# Patient Record
Sex: Female | Born: 1985 | Race: White | Hispanic: No | Marital: Married | Smoking: Never smoker
Health system: Southern US, Community
[De-identification: ages and names within clinical notes are randomized; demographics above are authoritative.]

## PROBLEM LIST (undated history)

## (undated) ENCOUNTER — Inpatient Hospital Stay (HOSPITAL_COMMUNITY): Payer: Self-pay

## (undated) DIAGNOSIS — F329 Major depressive disorder, single episode, unspecified: Secondary | ICD-10-CM

## (undated) DIAGNOSIS — G43909 Migraine, unspecified, not intractable, without status migrainosus: Secondary | ICD-10-CM

## (undated) DIAGNOSIS — Z8619 Personal history of other infectious and parasitic diseases: Secondary | ICD-10-CM

## (undated) DIAGNOSIS — F32A Depression, unspecified: Secondary | ICD-10-CM

## (undated) DIAGNOSIS — B999 Unspecified infectious disease: Secondary | ICD-10-CM

## (undated) DIAGNOSIS — K589 Irritable bowel syndrome without diarrhea: Secondary | ICD-10-CM

## (undated) DIAGNOSIS — Z9889 Other specified postprocedural states: Secondary | ICD-10-CM

## (undated) DIAGNOSIS — F419 Anxiety disorder, unspecified: Secondary | ICD-10-CM

## (undated) DIAGNOSIS — D649 Anemia, unspecified: Secondary | ICD-10-CM

## (undated) DIAGNOSIS — R112 Nausea with vomiting, unspecified: Secondary | ICD-10-CM

## (undated) HISTORY — PX: LAPAROSCOPY: SHX197

## (undated) HISTORY — DX: Personal history of other infectious and parasitic diseases: Z86.19

## (undated) HISTORY — DX: Irritable bowel syndrome, unspecified: K58.9

## (undated) HISTORY — DX: Depression, unspecified: F32.A

## (undated) HISTORY — PX: OTHER SURGICAL HISTORY: SHX169

## (undated) HISTORY — DX: Migraine, unspecified, not intractable, without status migrainosus: G43.909

## (undated) HISTORY — DX: Anxiety disorder, unspecified: F41.9

## (undated) HISTORY — DX: Major depressive disorder, single episode, unspecified: F32.9

---
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
## 2004-05-12 ENCOUNTER — Other Ambulatory Visit: Admission: RE | Admit: 2004-05-12 | Discharge: 2004-05-12 | Payer: Self-pay | Admitting: *Deleted

## 2005-08-01 ENCOUNTER — Other Ambulatory Visit: Admission: RE | Admit: 2005-08-01 | Discharge: 2005-08-01 | Payer: Self-pay | Admitting: *Deleted

## 2008-08-20 ENCOUNTER — Other Ambulatory Visit: Admission: RE | Admit: 2008-08-20 | Discharge: 2008-08-20 | Payer: Self-pay | Admitting: Gynecology

## 2009-07-17 ENCOUNTER — Encounter: Admission: RE | Admit: 2009-07-17 | Discharge: 2009-07-17 | Payer: Self-pay | Admitting: Family Medicine

## 2011-01-31 ENCOUNTER — Encounter (HOSPITAL_COMMUNITY)
Admission: RE | Admit: 2011-01-31 | Discharge: 2011-01-31 | Disposition: A | Discharge status: Home / Self Care | Payer: 59 | Source: Ambulatory Visit | Attending: Obstetrics and Gynecology | Admitting: Obstetrics and Gynecology

## 2011-01-31 LAB — CBC
HCT: 41.4 % (ref 36.0–46.0)
Hemoglobin: 14 g/dL (ref 12.0–15.0)
MCH: 31.5 pg (ref 26.0–34.0)
MCHC: 33.8 g/dL (ref 30.0–36.0)
MCV: 93.2 fL (ref 78.0–100.0)
Platelets: 249 10*3/uL (ref 150–400)
RBC: 4.44 MIL/uL (ref 3.87–5.11)
RDW: 12.1 % (ref 11.5–15.5)
WBC: 6.3 10*3/uL (ref 4.0–10.5)

## 2011-01-31 LAB — SURGICAL PCR SCREEN
MRSA, PCR: NEGATIVE
Staphylococcus aureus: POSITIVE — AB

## 2011-02-07 ENCOUNTER — Other Ambulatory Visit (HOSPITAL_COMMUNITY): Payer: Self-pay | Admitting: Obstetrics and Gynecology

## 2011-02-07 ENCOUNTER — Ambulatory Visit (HOSPITAL_COMMUNITY)
Admission: RE | Admit: 2011-02-07 | Discharge: 2011-02-07 | Disposition: A | Discharge status: Home / Self Care | Payer: 59 | Source: Ambulatory Visit | Attending: Obstetrics and Gynecology | Admitting: Obstetrics and Gynecology

## 2011-02-07 DIAGNOSIS — N949 Unspecified condition associated with female genital organs and menstrual cycle: Secondary | ICD-10-CM | POA: Insufficient documentation

## 2011-02-07 DIAGNOSIS — N803 Endometriosis of pelvic peritoneum, unspecified: Secondary | ICD-10-CM | POA: Insufficient documentation

## 2011-02-07 DIAGNOSIS — Z01812 Encounter for preprocedural laboratory examination: Secondary | ICD-10-CM | POA: Insufficient documentation

## 2011-02-07 DIAGNOSIS — Q504 Embryonic cyst of fallopian tube: Secondary | ICD-10-CM | POA: Insufficient documentation

## 2011-02-07 DIAGNOSIS — N946 Dysmenorrhea, unspecified: Secondary | ICD-10-CM | POA: Insufficient documentation

## 2011-02-07 DIAGNOSIS — Z01818 Encounter for other preprocedural examination: Secondary | ICD-10-CM | POA: Insufficient documentation

## 2011-02-07 LAB — PREGNANCY, URINE: Preg Test, Ur: NEGATIVE

## 2011-02-07 LAB — TYPE AND SCREEN
ABO/RH(D): A POS
Antibody Screen: NEGATIVE

## 2011-02-07 LAB — ABO/RH: ABO/RH(D): A POS

## 2011-02-08 NOTE — Op Note (Signed)
NAME:  Stacey Compton, Stacey Compton                ACCOUNT NO.:  [id number]  MEDICAL RECORD NO.:  [id number]           PATIENT TYPE:  O  LOCATION:  [facility]                          [facility]  PHYSICIAN:  Zelphia Cairo, MD    DATE OF BIRTH:  24-Mar-1986  DATE OF PROCEDURE:  02/07/2011 DATE OF DISCHARGE:                              OPERATIVE REPORT   PREOPERATIVE DIAGNOSES: 1. Dysmenorrhea. 2. Pelvic pain.  POSTOPERATIVE DIAGNOSES: 1. Pelvic pain. 2. Dysmenorrhea. 3. Adhesions. 4. Endometriosis.  PROCEDURE: 1. Diagnostic laparoscopy. 2. Fulguration of endometriosis. 3. Removal of hydatid cyst from left fallopian tube. 4. Lysis of adhesions. 5. Chromopertubation.  SURGEON:  Zelphia Cairo, MD.  ANESTHESIA:  General.  SPECIMEN:  Hydatid cyst to pathology.  BLOOD LOSS:  Minimal.  COMPLICATIONS:  None.  CONDITION:  Stable to recovery room.  DESCRIPTION OF PROCEDURE:  Stacey Compton was taken to the operating room after informed consent was obtained.  General anesthesia was obtained and she was placed in the dorsal lithotomy position using Allen stirrups.  She was prepped and draped in sterile fashion and an in-and-out catheter was used to drain her bladder for approximately 100 mL of clear urine. Bivalve speculum was placed in the vagina and a single-tooth tenaculum was placed on the anterior lip of the cervix.  Acorn tenaculum was placed through the cervix and attached to the single-tooth tenaculum. Speculum was removed and our attention was turned to the abdomen.  Local anesthesia was injected at the site of our infraumbilical incision and a small infraumbilical skin incision was made with a scalpel.  This was extended to the level of fascia bluntly using a Kelly clamp. Optical trocar was then inserted under direct visualization.  Once intraperitoneal placement was confirmed, CO2 was turned on and the abdomen and pelvis were insufflated.  A survey revealed a normal- appearing right  upper quadrant, normal-appearing appendix, normal- appearing uterus.  Right fallopian tube appeared normal.  There were a few small red-appearing endometriosis implants on the right ovary and a cluster of red endometriosis implants in the right ovarian fossa. The left fallopian tube and ovary were adhered to the left ovarian fossa.  The left sigmoid colon was adhered to the left ovarian fossa with thin filmy adhesions.  A 5-mm suprapubic incision was then made and a 5-mm trocar was inserted under direct visualization.  Blunt probe was placed through this port.    The left fallopian tube was lifted gently using million dollar graspers and a small hydatid cyst was noted from the left fallopian tube.  The base of the cyst was cauterized using the Kleppinger and the cyst was cut with laparoscopic scissors and removed through the port, passed off to be sent to pathology.  The blunt probe was then inserted through the suprapubic port, and the left sigmoid colon and rectum were bluntly dissected free of the left pelvic sidewall and ovarian fossa.  The left fallopian tube was then grasped, tented upwards and toward midline and thin filmy adhesions between the left fallopian tube and colon were then dissected free.  Implants of endometriosis were then ablated in the right  ovarian fossa, on the right ovary, on the left ovary.  There was a small cluster of endometriosis implants lying on the peritoneum of the left pelvic sidewall overlying the ureter.  These were not cauterized.  Chromopertubation was then performed using a dilute solution of methylene blue, blue dye immediately flowed from bilateral fallopian tubes.  A large syringe was then used to remove fluid and blue dye from the pelvis.  All instruments were then removed from the abdomen.  Trocars were removed and the CO2 was released.  A deep stitch was placed in the infraumbilical skin incision.  The skin was reapproximated using a  figure-of-eight stitch of 3-0 Vicryl.  Dermabond was placed over both incisions.  The acorn tenaculum and single-tooth tenaculum were removed from the cervix.  The patient was extubated and taken to the recovery room in stable condition.     Zelphia Cairo, MD     GA/MEDQ  D:  02/07/2011  T:  02/07/2011  Job:  [id number]  Electronically Signed by Zelphia Cairo MD on 02/08/2011 09:47:15 PM

## 2012-02-16 ENCOUNTER — Emergency Department (HOSPITAL_COMMUNITY)
Admission: EM | Admit: 2012-02-16 | Discharge: 2012-02-16 | Disposition: A | Discharge status: Home / Self Care | Payer: PRIVATE HEALTH INSURANCE | Attending: Emergency Medicine | Admitting: Emergency Medicine

## 2012-02-16 ENCOUNTER — Encounter (HOSPITAL_COMMUNITY): Payer: Self-pay | Admitting: *Deleted

## 2012-02-16 DIAGNOSIS — B349 Viral infection, unspecified: Secondary | ICD-10-CM

## 2012-02-16 DIAGNOSIS — R197 Diarrhea, unspecified: Secondary | ICD-10-CM | POA: Insufficient documentation

## 2012-02-16 DIAGNOSIS — R1110 Vomiting, unspecified: Secondary | ICD-10-CM | POA: Insufficient documentation

## 2012-02-16 DIAGNOSIS — Z8742 Personal history of other diseases of the female genital tract: Secondary | ICD-10-CM | POA: Insufficient documentation

## 2012-02-16 DIAGNOSIS — A088 Other specified intestinal infections: Secondary | ICD-10-CM | POA: Insufficient documentation

## 2012-02-16 DIAGNOSIS — K529 Noninfective gastroenteritis and colitis, unspecified: Secondary | ICD-10-CM

## 2012-02-16 LAB — URINALYSIS, ROUTINE W REFLEX MICROSCOPIC
Bilirubin Urine: NEGATIVE
Glucose, UA: NEGATIVE mg/dL
Ketones, ur: NEGATIVE mg/dL
Leukocytes, UA: NEGATIVE
Nitrite: NEGATIVE
Protein, ur: NEGATIVE mg/dL
Specific Gravity, Urine: 1.015 (ref 1.005–1.030)
Urobilinogen, UA: 0.2 mg/dL (ref 0.0–1.0)
pH: 6.5 (ref 5.0–8.0)

## 2012-02-16 LAB — URINE MICROSCOPIC-ADD ON

## 2012-02-16 LAB — PREGNANCY, URINE: Preg Test, Ur: NEGATIVE

## 2012-02-16 MED ORDER — MORPHINE SULFATE 2 MG/ML IJ SOLN
2.0000 mg | Freq: Once | INTRAMUSCULAR | Status: AC
  Administered 2012-02-16: 2 mg via INTRAVENOUS
  Filled 2012-02-16: qty 1

## 2012-02-16 MED ORDER — FAMOTIDINE IN NACL 20-0.9 MG/50ML-% IV SOLN
20.0000 mg | Freq: Once | INTRAVENOUS | Status: AC
  Administered 2012-02-16: 20 mg via INTRAVENOUS
  Filled 2012-02-16: qty 50

## 2012-02-16 MED ORDER — ONDANSETRON HCL 4 MG/2ML IJ SOLN
4.0000 mg | Freq: Once | INTRAMUSCULAR | Status: AC
  Administered 2012-02-16: 4 mg via INTRAVENOUS
  Filled 2012-02-16: qty 2

## 2012-02-16 MED ORDER — SODIUM CHLORIDE 0.9 % IV SOLN
Freq: Once | INTRAVENOUS | Status: AC
  Administered 2012-02-16: 500 mL via INTRAVENOUS

## 2012-02-16 MED ORDER — HYDROCODONE-ACETAMINOPHEN 5-325 MG PO TABS: 1.0000 | ORAL_TABLET | ORAL | Status: AC | PRN

## 2012-02-16 MED ORDER — SODIUM CHLORIDE 0.9 % IV BOLUS (SEPSIS)
500.0000 mL | Freq: Once | INTRAVENOUS | Status: AC
  Administered 2012-02-16: 500 mL via INTRAVENOUS

## 2012-02-16 MED ORDER — ONDANSETRON HCL 8 MG PO TABS: 8.0000 mg | ORAL_TABLET | Freq: Three times a day (TID) | ORAL | Status: AC | PRN

## 2012-02-16 MED ORDER — FAMOTIDINE 20 MG PO TABS: 20.0000 mg | ORAL_TABLET | Freq: Two times a day (BID) | ORAL | Status: DC | PRN

## 2012-02-16 MED ORDER — SODIUM CHLORIDE 0.9 % IV BOLUS (SEPSIS)
1000.0000 mL | Freq: Once | INTRAVENOUS | Status: AC
  Administered 2012-02-16: 1000 mL via INTRAVENOUS

## 2012-02-16 NOTE — ED Notes (Signed)
Pt reports n/v/d that started around 2200 yesterday,

## 2012-02-16 NOTE — Discharge Instructions (Signed)
Clear Liquid Diet The clear liquid dietconsists of foods that are liquid or will become liquid at room temperature.You should be able to see through the liquid and beverages. Examples of foods allowed on a clear liquid diet include fruit juice, broth or bouillon, gelatin, or frozen ice pops. The purpose of this diet is to provide necessary fluid, electrolytes such as sodium and potassium, and energy to keep the body functioning during times when you are not able to consume a regular diet.A clear liquid diet should not be continued for long periods of time as it is not nutritionally adequate.  REASONS FOR USING A CLEAR LIQUID DIET  In sudden onset (acute) conditions for a patient before or after surgery.   As the first step in oral feeding.   For fluid and electrolyte replacement in diarrheal diseases.   As a diet before certain medical tests are performed.  ADEQUACY The clear liquid diet is adequate only in ascorbic acid, according to the Recommended Dietary Allowances of the [employer]. CHOOSING FOODS Breads and Starches  Allowed:  None are allowed.   Avoid: All are avoided.  Vegetables  Allowed:  Strained tomato or vegetable juice.   Avoid: Any others.  Fruit  Allowed:  Strained fruit juices and fruit drinks. Include 1 serving of citrus or vitamin C-enriched fruit juice daily.   Avoid: Any others.  Meat and Meat Substitutes  Allowed:  None are allowed.   Avoid: All are avoided.  Milk  Allowed:  None are allowed.   Avoid: All are avoided.  Soups and Combination Foods  Allowed:  Clear bouillon, broth, or strained broth-based soups.   Avoid: Any others.  Desserts and Sweets  Allowed:  Sugar, honey. High protein gelatin. Flavored gelatin, ices, or frozen ice pops that do not contain milk.   Avoid: Any others.  Fats and Oils  Allowed:  None are allowed.   Avoid: All are avoided.  Beverages  Allowed: Cereal beverages, coffee (regular or  decaffeinated), tea, or soda at the discretion of your caregiver.   Avoid: Any others.  Condiments  Allowed:  Iodized salt.   Avoid: Any others, including pepper.  Supplements  Allowed:  Liquid nutrition beverages.   Avoid: Any others that contain lactose or fiber.  SAMPLE MEAL PLAN Breakfast  4 oz (120 mL) strained orange juice.    to 1 cup (125 to 250 mL) gelatin (plain or fortified).   1 cup (250 mL) beverage (coffee or tea).   Sugar, if desired.  Midmorning Snack   cup (125 mL) gelatin (plain or fortified).  Lunch  1 cup (250 mL) broth or consomm.   4 oz (120 mL) strained grapefruit juice.    cup (125 mL) gelatin (plain or fortified).   1 cup (250 mL) beverage (coffee or tea).   Sugar, if desired.  Midafternoon Snack   cup (125 mL) fruit ice.    cup (125 mL) strained fruit juice.  Dinner  1 cup (250 mL) broth or consomm.    cup (125 mL) cranberry juice.    cup (125 mL) flavored gelatin (plain or fortified).   1 cup (250 mL) beverage (coffee or tea).   Sugar, if desired.  Evening Snack  4 oz (120 mL) strained apple juice (vitamin C-fortified).    cup (125 mL) flavored gelatin (plain or fortified).  Document Released: 10/24/2005 Document Revised: 10/13/2011 Document Reviewed: 01/21/2011 [facility] Patient Information 2012 [place], [place].Diet for Diarrhea, Adult Having frequent, runny stools (diarrhea) has many causes.  Diarrhea may be caused or worsened by food or drink. Diarrhea may be relieved by changing your diet. IF YOU ARE NOT TOLERATING SOLID FOODS:  Drink enough water and fluids to keep your urine clear or pale yellow.   Avoid sugary drinks and sodas as well as milk-based beverages.   Avoid beverages containing caffeine and alcohol.   You may try rehydrating beverages. You can make your own by following this recipe:    tsp table salt.    tsp baking soda.   ? tsp salt substitute (potassium chloride).   1 tbs + 1 tsp  sugar.   1 qt water.  As your stools become more solid, you can start eating solid foods. Add foods one at a time. If a certain food causes your diarrhea to get worse, avoid that food and try other foods. A low fiber, low-fat, and lactose-free diet is recommended. Small, frequent meals may be better tolerated.  Starches  Allowed:  White, [place], and pita breads, plain rolls, buns, bagels. Plain muffins, matzo. Soda, saltine, or graham crackers. Pretzels, melba toast, zwieback. Cooked cereals made with water: cornmeal, farina, cream cereals. Dry cereals: refined corn, wheat, rice. Potatoes prepared any way without skins, refined macaroni, spaghetti, noodles, refined rice.   Avoid:  Bread, rolls, or crackers made with whole wheat, multi-grains, rye, bran seeds, nuts, or coconut. Corn tortillas or taco shells. Cereals containing whole grains, multi-grains, bran, coconut, nuts, or raisins. Cooked or dry oatmeal. Coarse wheat cereals, granola. Cereals advertised as "high-fiber." Potato skins. Whole grain pasta, wild or brown rice. Popcorn. Sweet potatoes/yams. Sweet rolls, doughnuts, waffles, pancakes, sweet breads.  Vegetables  Allowed: Strained tomato and vegetable juices. Most well-cooked and canned vegetables without seeds. Fresh: Tender lettuce, cucumber without the skin, cabbage, spinach, bean sprouts.   Avoid: Fresh, cooked, or canned: Artichokes, baked beans, beet greens, broccoli, Brussels sprouts, corn, kale, legumes, peas, sweet potatoes. Cooked: Green or red cabbage, spinach. Avoid large servings of any vegetables, because vegetables shrink when cooked, and they contain more fiber per serving than fresh vegetables.  Fruit  Allowed: All fruit juices except prune juice. Cooked or canned: Apricots, applesauce, cantaloupe, cherries, fruit cocktail, grapefruit, grapes, kiwi, mandarin oranges, peaches, pears, plums, watermelon. Fresh: Apples without skin, ripe banana, grapes, cantaloupe, cherries,  grapefruit, peaches, oranges, plums. Keep servings limited to  cup or 1 piece.   Avoid: Fresh: Apple with skin, apricots, mango, pears, raspberries, strawberries. Prune juice, stewed or dried prunes. Dried fruits, raisins, dates. Large servings of all fresh fruits.  Meat and Meat Substitutes  Allowed: Ground or well-cooked tender beef, ham, veal, lamb, pork, or poultry. Eggs, plain cheese. Fish, oysters, shrimp, lobster, other seafoods. Liver, organ meats.   Avoid: Tough, fibrous meats with gristle. Peanut butter, smooth or chunky. Cheese, nuts, seeds, legumes, dried peas, beans, lentils.  Milk  Allowed: Yogurt, lactose-free milk, kefir, drinkable yogurt, buttermilk, soy milk.   Avoid: Milk, chocolate milk, beverages made with milk, such as milk shakes.  Soups  Allowed: Bouillon, broth, or soups made from allowed foods. Any strained soup.   Avoid: Soups made from vegetables that are not allowed, cream or milk-based soups.  Desserts and Sweets  Allowed: Sugar-free gelatin, sugar-free frozen ice pops made without sugar alcohol.   Avoid: Plain cakes and cookies, pie made with allowed fruit, pudding, custard, cream pie. Gelatin, fruit, ice, sherbet, frozen ice pops. Ice cream, ice milk without nuts. Plain hard candy, honey, jelly, molasses, syrup, sugar, chocolate syrup, gumdrops, marshmallows.  Fats  and Oils  Allowed: Avoid any fats and oils.   Avoid: Seeds, nuts, olives, avocados. Margarine, butter, cream, mayonnaise, salad oils, plain salad dressings made from allowed foods. Plain gravy, crisp bacon without rind.  Beverages  Allowed: Water, decaffeinated teas, oral rehydration solutions, sugar-free beverages.   Avoid: Fruit juices, caffeinated beverages (coffee, tea, soda or pop), alcohol, sports drinks, or lemon-lime soda or pop.  Condiments  Allowed: Ketchup, mustard, horseradish, vinegar, cream sauce, cheese sauce, cocoa powder. Spices in moderation: allspice, basil, bay leaves,  celery powder or leaves, cinnamon, cumin powder, curry powder, ginger, mace, marjoram, onion or garlic powder, oregano, paprika, parsley flakes, ground pepper, rosemary, sage, savory, tarragon, thyme, turmeric.   Avoid: Coconut, honey.  Weight Monitoring: Weigh yourself every day. You should weigh yourself in the morning after you urinate and before you eat breakfast. Wear the same amount of clothing when you weigh yourself. Record your weight daily. Bring your recorded weights to your clinic visits. Tell your caregiver right away if you have gained 3 lb/1.4 kg or more in 1 day, 5 lb/2.3 kg in a week, or whatever amount you were told to report. SEEK IMMEDIATE MEDICAL CARE IF:   You are unable to keep fluids down.   You start to throw up (vomit) or diarrhea keeps coming back (persistent).   Abdominal pain develops, increases, or can be felt in one place (localizes).   You have an oral temperature above 102 F (38.9 C), not controlled by medicine.   Diarrhea contains blood or mucus.   You develop excessive weakness, dizziness, fainting, or extreme thirst.  MAKE SURE YOU:   Understand these instructions.   Will watch your condition.   Will get help right away if you are not doing well or get worse.  Document Released: 01/14/2004 Document Revised: 10/13/2011 Document Reviewed: 05/07/2009 [facility] Patient Information 2012 [place], [place].Viral Gastroenteritis Viral gastroenteritis is also known as stomach flu. This condition affects the stomach and intestinal tract. It can cause sudden diarrhea and vomiting. The illness typically lasts 3 to 8 days. Most people develop an immune response that eventually gets rid of the virus. While this natural response develops, the virus can make you quite ill. CAUSES  Many different viruses can cause gastroenteritis, such as rotavirus or noroviruses. You can catch one of these viruses by consuming contaminated food or water. You may also catch a virus by  sharing utensils or other personal items with an infected person or by touching a contaminated surface. SYMPTOMS  The most common symptoms are diarrhea and vomiting. These problems can cause a severe loss of body fluids (dehydration) and a body salt (electrolyte) imbalance. Other symptoms may include:  Fever.   Headache.   Fatigue.   Abdominal pain.  DIAGNOSIS  Your caregiver can usually diagnose viral gastroenteritis based on your symptoms and a physical exam. A stool sample may also be taken to test for the presence of viruses or other infections. TREATMENT  This illness typically goes away on its own. Treatments are aimed at rehydration. The most serious cases of viral gastroenteritis involve vomiting so severely that you are not able to keep fluids down. In these cases, fluids must be given through an intravenous line (IV). HOME CARE INSTRUCTIONS   Drink enough fluids to keep your urine clear or pale yellow. Drink small amounts of fluids frequently and increase the amounts as tolerated.   Ask your caregiver for specific rehydration instructions.   Avoid:   Foods high in sugar.  Alcohol.   Carbonated drinks.   Tobacco.   Juice.   Caffeine drinks.   Extremely hot or cold fluids.   Fatty, greasy foods.   Too much intake of anything at one time.   Dairy products until 24 to 48 hours after diarrhea stops.   You may consume probiotics. Probiotics are active cultures of beneficial bacteria. They may lessen the amount and number of diarrheal stools in adults. Probiotics can be found in yogurt with active cultures and in supplements.   Wash your hands well to avoid spreading the virus.   Only take over-the-counter or prescription medicines for pain, discomfort, or fever as directed by your caregiver. Do not give aspirin to children. Antidiarrheal medicines are not recommended.   Ask your caregiver if you should continue to take your regular prescribed and over-the-counter  medicines.   Keep all follow-up appointments as directed by your caregiver.  SEEK IMMEDIATE MEDICAL CARE IF:   You are unable to keep fluids down.   You do not urinate at least once every 6 to 8 hours.   You develop shortness of breath.   You notice blood in your stool or vomit. This may look like coffee grounds.   You have abdominal pain that increases or is concentrated in one small area (localized).   You have persistent vomiting or diarrhea.   You have a fever.   The patient is a child younger than 3 months, and he or she has a fever.   The patient is a child older than 3 months, and he or she has a fever and persistent symptoms.   The patient is a child older than 3 months, and he or she has a fever and symptoms suddenly get worse.   The patient is a baby, and he or she has no tears when crying.  MAKE SURE YOU:   Understand these instructions.   Will watch your condition.   Will get help right away if you are not doing well or get worse.  Document Released: 10/24/2005 Document Revised: 10/13/2011 Document Reviewed: 08/10/2011 [facility] Patient Information 2012 [place], [place].

## 2012-02-16 NOTE — ED Notes (Signed)
Pt reports started having n/v/d around 2200 yesterday.

## 2012-02-16 NOTE — ED Provider Notes (Signed)
History  This chart was scribed for Felisa Bonier, MD by Bennett Scrape and Cherlynn Perches. This patient was seen in room APA06/APA06 and the patient's care was started at 7:59AM.  CSN: [id number]  Arrival date & time 02/16/12  0532   First MD Initiated Contact with Patient 02/16/12 0719      Chief Complaint  Patient presents with  . Emesis  . Diarrhea    (Consider location/radiation/quality/duration/timing/severity/associated sxs/prior treatment) Patient is a 26 y.o. female presenting with vomiting. The history is provided by the patient. No language interpreter was used.  Emesis  This is a new problem. The current episode started 6 to 12 hours ago. The problem occurs continuously. The problem has not changed since onset.The emesis has an appearance of stomach contents. There has been no fever. Associated symptoms include abdominal pain, chills, cough, diarrhea and sweats. Pertinent negatives include no fever.    Stacey Compton is a 26 y.o. female who presents to the Emergency Department complaining of 10 hours of constant emesis with associated diarrhea, nausea, chills, diaphoresis, abdominal pain, and cough. No modifying factors were reported. Pt states that in the 10 hours of emesis, vomiting has been constant, with a maximum of 30 minutes between episodes. Pt reports that diaphoresis and chills are only present when vomiting. Pt reports having no known ill contacts. Pt denies fever, chest pain, and SOB.   Past Medical History  Diagnosis Date  . Endometriosis     Past Surgical History  Procedure Date  . Laparoscopy     History reviewed. No pertinent family history.  History  Substance Use Topics  . Smoking status: Never Smoker   . Smokeless tobacco: Not on file  . Alcohol Use: No    OB History    Grav Para Term Preterm Abortions TAB SAB Ect Mult Living                  Review of Systems  Constitutional: Positive for chills, diaphoresis and appetite change.  Negative for fever.  Respiratory: Positive for cough. Negative for shortness of breath and wheezing.   Cardiovascular: Negative for chest pain.  Gastrointestinal: Positive for vomiting, abdominal pain and diarrhea.  Psychiatric/Behavioral: Negative for confusion.  All other systems reviewed and are negative.    Allergies  Review of patient's allergies indicates no known allergies.  Home Medications   Current Outpatient Rx  Name Route Sig Dispense Refill  . NORETHINDRONE ACETATE 5 MG PO TABS Oral Take 5 mg by mouth daily.      Triage Vitals: BP 98/85  Pulse 110  Temp(Src) 98.3 F (36.8 C) (Oral)  Resp 20  Ht 5\' 3"  (1.6 m)  Wt 125 lb (56.7 kg)  BMI 22.14 kg/m2  SpO2 96%  Physical Exam  Nursing note and vitals reviewed. Constitutional: She is oriented to person, place, and time. She appears well-developed and well-nourished.  HENT:  Head: Normocephalic and atraumatic.  Right Ear: External ear normal.  Left Ear: External ear normal.  Nose: Nose normal.  Mouth/Throat: Oropharynx is clear and moist. No oropharyngeal exudate.       Slightly dry mucous membrane, TMs and auditory canals normal bilaterally. No erythema or edema noted to the posterior oropharynx.  Eyes: Conjunctivae are normal. Pupils are equal, round, and reactive to light. No scleral icterus.  Neck: Normal range of motion. Neck supple.  Cardiovascular: Normal rate, regular rhythm and normal heart sounds.        Radial pulse palpable  Pulmonary/Chest: Effort  normal and breath sounds normal. No respiratory distress. She has no wheezes. She has no rales.       Lungs clear to auscultation bilaterally, no rhonchi noted.  Abdominal: Soft. Bowel sounds are normal. She exhibits no distension and no mass. There is tenderness (mild left tenderness). There is no rebound and no guarding.  Musculoskeletal: Normal range of motion. She exhibits no edema (no edema to lower extremities).  Neurological: She is alert and oriented  to person, place, and time.  Skin: Skin is warm and dry.       capillary refill <1s  Psychiatric: She has a normal mood and affect. Her behavior is normal.    ED Course  Procedures (including critical care time)  DIAGNOSTIC STUDIES: Oxygen Saturation is 96% on room air, adequate by my interpretation.    COORDINATION OF CARE: 8:04AM - Pt informed of treatment plan. Will try to control nausea, vomiting with medication and run fluid trial. Pt informed that diarrhea will continue for 1-2 days. Pt advised to rehydrate. Pt agrees to treatment plan    Labs Reviewed  URINALYSIS, ROUTINE W REFLEX MICROSCOPIC - Abnormal; Notable for the following:    Hgb urine dipstick TRACE (*)    All other components within normal limits  PREGNANCY, URINE  URINE MICROSCOPIC-ADD ON   No results found.   No diagnosis found.    MDM  The patient now feeling significantly better, tolerating oral intake of fluids with no further emesis. She requests discharge home. I do believe that the patient has a viral gastroenteritis and I do not have suspicion based on her history or physical examination for acute pancreatitis, acute cholecystitis, cholelithiasis, hepatitis, bowel obstruction, or urinary tract infection. I will discharge patient home.  I personally performed the services described in this documentation, which was scribed in my presence. The recorded information has been reviewed and considered.    Felisa Bonier, MD 02/16/12 [phone]

## 2012-09-07 LAB — OB RESULTS CONSOLE HIV ANTIBODY (ROUTINE TESTING): HIV: NONREACTIVE

## 2012-09-07 LAB — OB RESULTS CONSOLE RUBELLA ANTIBODY, IGM: Rubella: IMMUNE

## 2012-09-07 LAB — OB RESULTS CONSOLE ABO/RH: RH Type: POSITIVE

## 2012-09-07 LAB — OB RESULTS CONSOLE GC/CHLAMYDIA
Chlamydia: NEGATIVE
Gonorrhea: NEGATIVE

## 2012-09-07 LAB — OB RESULTS CONSOLE HEPATITIS B SURFACE ANTIGEN: Hepatitis B Surface Ag: NEGATIVE

## 2012-09-07 LAB — OB RESULTS CONSOLE RPR: RPR: NONREACTIVE

## 2012-11-07 NOTE — L&D Delivery Note (Signed)
Operative Delivery Note At 7:44 PM a viable female was delivered via Vaginal, [employer], operational).  Presentation: vertex; Position: Occiput,, Anterior; Station: +3.  Verbal consent: obtained from patient.  Risks and benefits discussed in detail.  Risks include, but are not limited to the risks of anesthesia, bleeding, infection, damage to maternal tissues, fetal cephalhematoma.  There is also the risk of inability to effect vaginal delivery of the head, or shoulder dystocia that cannot be resolved by established maneuvers, leading to the need for emergency cesarean section. Initially used mytivac mushroom,  Would not hold suction.  Brought in kiwi applied and brought to proper sunction level,  Vertex brought to crowning.  Kiwi removed.  Subsequent vag delivery over 2 degree laceration.   APGAR: 8/9, ; weight .   Placenta status: Intact, Spontaneous.   Cord: 3 vessels with the following complications: None.  Cord pH: na  Anesthesia: Epidural  Instruments: mytivac mushroom and kiwi Episiotomy: None Lacerations: 2nd degree Suture Repair: 2.0 chromic Est. Blood Loss (mL): 350  Mom to postpartum.  Baby to nursery-stable.  Amenda Duclos S 04/08/2013, 7:58 PM

## 2013-01-25 ENCOUNTER — Encounter: Payer: Self-pay | Admitting: *Deleted

## 2013-01-29 ENCOUNTER — Encounter (HOSPITAL_COMMUNITY): Payer: Self-pay

## 2013-01-29 ENCOUNTER — Inpatient Hospital Stay (HOSPITAL_COMMUNITY)
Admission: AD | Admit: 2013-01-29 | Discharge: 2013-01-29 | Disposition: A | Discharge status: Home / Self Care | Payer: PRIVATE HEALTH INSURANCE | Source: Ambulatory Visit | Attending: Obstetrics and Gynecology | Admitting: Obstetrics and Gynecology

## 2013-01-29 DIAGNOSIS — N898 Other specified noninflammatory disorders of vagina: Secondary | ICD-10-CM

## 2013-01-29 DIAGNOSIS — N949 Unspecified condition associated with female genital organs and menstrual cycle: Secondary | ICD-10-CM | POA: Insufficient documentation

## 2013-01-29 DIAGNOSIS — R109 Unspecified abdominal pain: Secondary | ICD-10-CM | POA: Insufficient documentation

## 2013-01-29 DIAGNOSIS — O26893 Other specified pregnancy related conditions, third trimester: Secondary | ICD-10-CM

## 2013-01-29 DIAGNOSIS — O26899 Other specified pregnancy related conditions, unspecified trimester: Secondary | ICD-10-CM

## 2013-01-29 DIAGNOSIS — O99891 Other specified diseases and conditions complicating pregnancy: Secondary | ICD-10-CM | POA: Insufficient documentation

## 2013-01-29 LAB — URINALYSIS, ROUTINE W REFLEX MICROSCOPIC
Bilirubin Urine: NEGATIVE
Glucose, UA: NEGATIVE mg/dL
Hgb urine dipstick: NEGATIVE
Ketones, ur: NEGATIVE mg/dL
Leukocytes, UA: NEGATIVE
Nitrite: NEGATIVE
Protein, ur: NEGATIVE mg/dL
Specific Gravity, Urine: 1.015 (ref 1.005–1.030)
Urobilinogen, UA: 0.2 mg/dL (ref 0.0–1.0)
pH: 6 (ref 5.0–8.0)

## 2013-01-29 LAB — WET PREP, GENITAL
Clue Cells Wet Prep HPF POC: NONE SEEN
Trich, Wet Prep: NONE SEEN
Yeast Wet Prep HPF POC: NONE SEEN

## 2013-01-29 LAB — AMNISURE RUPTURE OF MEMBRANE (ROM) NOT AT ARMC: Amnisure ROM: NEGATIVE

## 2013-01-29 NOTE — MAU Note (Signed)
Patient is in with c/o possible of rupture of fluids last night with clear fluids. Constant pelvic pressure for a week but have increased in intensity. She reports good fetal movement. Denies vaginal bleeding

## 2013-01-29 NOTE — MAU Note (Signed)
Pt states having persistent trickling of fluid, unsure if it is amniotic or not. Advised to be evaluated in MAU. Notes increased lower abd pain/pressure into vaginal area. Denies bleeding.

## 2013-01-29 NOTE — MAU Provider Note (Signed)
Chief Complaint:  R/O Rupture    First Provider Initiated Contact with Patient 01/29/13 1802      HPI: Stacey Compton is a 27 y.o. G1P0 at 65w5dwho presents to maternity admissions reporting cramping and constant pelvic pressure x2-3 days, with leakage of clear fluid x3-4 episodes starting last night.  She describes the leakage as clear, enough to wet a panty liner or her underwear, but not requiring a pad or running down her leg when standing up.  She reports good fetal movement, denies vaginal bleeding, vaginal itching/burning, urinary symptoms, h/a, dizziness, n/v, or fever/chills.     Past Medical History: Past Medical History  Diagnosis Date  . Endometriosis   . Migraines     with aura    Past obstetric history: OB History   Grav Para Term Preterm Abortions TAB SAB Ect Mult Living   1              # Outc Date GA Lbr Len/2nd Wgt Sex Del Anes PTL Lv   1 CUR               Past Surgical History: Past Surgical History  Procedure Laterality Date  . Laparoscopy      Family History: History reviewed. No pertinent family history.  Social History: History  Substance Use Topics  . Smoking status: Never Smoker   . Smokeless tobacco: Not on file  . Alcohol Use: No    Allergies: No Known Allergies  Meds:  Prescriptions prior to admission  Medication Sig Dispense Refill  . Docosahexaenoic Acid (DHA PO) Take 1 capsule by mouth at bedtime.      . Prenatal Vit-Fe Fumarate-FA (PRENATAL MULTIVITAMIN) TABS Take 1 tablet by mouth at bedtime.      . ranitidine (ZANTAC) 75 MG tablet Take 75 mg by mouth 2 (two) times daily.        ROS: Pertinent findings in history of present illness.  Physical Exam  Blood pressure 99/69, pulse 96, temperature 98 F (36.7 C), temperature source Oral, resp. rate 16, height 5\' 3"  (1.6 m), weight 66.452 kg (146 lb 8 oz), SpO2 100.00%. GENERAL: Well-developed, well-nourished female in no acute distress.  HEENT: normocephalic HEART: normal  rate RESP: normal effort ABDOMEN: Soft, non-tender, gravid appropriate for gestational age EXTREMITIES: Nontender, no edema NEURO: alert and oriented Pelvic exam: Cervix pink, visually closed, without lesion, large amount white creamy discharge, clinging to vaginal walls, vaginal walls and external genitalia normal Negative pooling with cough/bearing down  Dilation: Closed Effacement (%): Thick Exam by:: Zaydee Aina leftwich-kirby cnm  FHT:  Baseline 135 , moderate variability, accelerations present (15x15), no decelerations Contractions: None on toco or to palpation   Labs: Results for orders placed during the hospital encounter of 01/29/13 (from the past 24 hour(s))  AMNISURE RUPTURE OF MEMBRANE (ROM)     Status: None   Collection Time    01/29/13  6:00 PM      Result Value Range   Amnisure ROM NEGATIVE    WET PREP, GENITAL     Status: Abnormal   Collection Time    01/29/13  6:00 PM      Result Value Range   Yeast Wet Prep HPF POC NONE SEEN  NONE SEEN   Trich, Wet Prep NONE SEEN  NONE SEEN   Clue Cells Wet Prep HPF POC NONE SEEN  NONE SEEN   WBC, Wet Prep HPF POC MODERATE (*) NONE SEEN      Assessment: 1. Vaginal discharge in  pregnancy, third trimester     Plan: Called Dr Rana Snare to review assessment and findings Discharge home with PTL precautions F/U with Dr Vincente Poli in office as scheduled Return to MAU as needed    Medication List    ASK your doctor about these medications       DHA PO  Take 1 capsule by mouth at bedtime.     prenatal multivitamin Tabs  Take 1 tablet by mouth at bedtime.     ranitidine 75 MG tablet  Commonly known as:  ZANTAC  Take 75 mg by mouth 2 (two) times daily.        Sharen Counter Certified Nurse-Midwife 01/29/2013 6:34 PM

## 2013-03-07 LAB — OB RESULTS CONSOLE GBS: GBS: POSITIVE

## 2013-03-08 ENCOUNTER — Ambulatory Visit (INDEPENDENT_AMBULATORY_CARE_PROVIDER_SITE_OTHER): Payer: PRIVATE HEALTH INSURANCE | Admitting: Family Medicine

## 2013-03-08 ENCOUNTER — Encounter: Payer: Self-pay | Admitting: Family Medicine

## 2013-03-08 VITALS — BP 112/80 | Temp 97.9°F | Ht 63.0 in | Wt 160.0 lb

## 2013-03-08 DIAGNOSIS — J029 Acute pharyngitis, unspecified: Secondary | ICD-10-CM

## 2013-03-08 DIAGNOSIS — J019 Acute sinusitis, unspecified: Secondary | ICD-10-CM

## 2013-03-08 MED ORDER — AMOXICILLIN 500 MG PO CAPS: 500.0000 mg | ORAL_CAPSULE | Freq: Three times a day (TID) | ORAL | Status: DC

## 2013-03-08 NOTE — Patient Instructions (Signed)
UC amoxicillin 3 times a day for the next 10 days for your sinus infection. Try to get plenty of rest. Keep yourself well hydrated. He may use saline nasal spray. Avoid any decongestants. In addition to this if you start running high fevers it is important to called her gynecologist. Certainly if any problems feel free to call us.

## 2013-03-08 NOTE — Progress Notes (Signed)
  Subjective:    Patient ID: Stacey Compton, female    DOB: July 13, 1986, 27 y.o.   MRN: [id number]  Sore Throat  This is a new problem. The current episode started in the past 7 days. The problem has been gradually worsening. Neither side of throat is experiencing more pain than the other. The maximum temperature recorded prior to her arrival was 100 - 100.9 F. The fever has been present for 1 to 2 days. The pain is at a severity of 8/10. The pain is moderate. Associated symptoms include congestion. Associated symptoms comments: Nausea, dizziness. Treatments tried: OTC sinus medication. The treatment provided no relief.   Patient is [redacted] weeks pregnant she denies any high fever she states it just more of a low-grade fever along with head congestion sinus pressure soreness discomfort underneath the eyes and sore throat. She denies any wheezing or difficulty breathing.    Review of Systems  HENT: Positive for congestion.        Objective:   Physical Exam Eardrums are normal throat ear red but no exudate notedno swelling noted. Lungs are clear hearts regular.       Assessment & Plan:  Pharyngitis-probable viral illness with acute sinusitis-amoxicillin 500 3 times a day 10 days recheck if progressive troubles patient was also encourage if high fevers call her OB/GYN. Followup here if any particular issues call if worse.

## 2013-04-04 ENCOUNTER — Telehealth (HOSPITAL_COMMUNITY): Payer: Self-pay | Admitting: *Deleted

## 2013-04-04 ENCOUNTER — Encounter (HOSPITAL_COMMUNITY): Payer: Self-pay | Admitting: *Deleted

## 2013-04-04 NOTE — Telephone Encounter (Signed)
Preadmission screen  

## 2013-04-07 ENCOUNTER — Encounter (HOSPITAL_COMMUNITY): Payer: Self-pay

## 2013-04-07 ENCOUNTER — Inpatient Hospital Stay (HOSPITAL_COMMUNITY)
Admission: RE | Admit: 2013-04-07 | Discharge: 2013-04-10 | DRG: 775 | Disposition: A | Discharge status: Home / Self Care | Payer: PRIVATE HEALTH INSURANCE | Source: Ambulatory Visit | Attending: Obstetrics and Gynecology | Admitting: Obstetrics and Gynecology

## 2013-04-07 DIAGNOSIS — O99892 Other specified diseases and conditions complicating childbirth: Secondary | ICD-10-CM | POA: Diagnosis present

## 2013-04-07 DIAGNOSIS — Z22330 Carrier of Group B streptococcus: Secondary | ICD-10-CM

## 2013-04-07 LAB — CBC
HCT: 39.8 % (ref 36.0–46.0)
Hemoglobin: 13.6 g/dL (ref 12.0–15.0)
MCH: 32.3 pg (ref 26.0–34.0)
MCHC: 34.2 g/dL (ref 30.0–36.0)
MCV: 94.5 fL (ref 78.0–100.0)
Platelets: 211 10*3/uL (ref 150–400)
RBC: 4.21 MIL/uL (ref 3.87–5.11)
RDW: 14 % (ref 11.5–15.5)
WBC: 12.6 10*3/uL — ABNORMAL HIGH (ref 4.0–10.5)

## 2013-04-07 MED ORDER — ZOLPIDEM TARTRATE 5 MG PO TABS: 5.0000 mg | ORAL_TABLET | Freq: Every evening | ORAL | Status: DC | PRN

## 2013-04-07 MED ORDER — CITRIC ACID-SODIUM CITRATE 334-500 MG/5ML PO SOLN: 30.0000 mL | ORAL | Status: DC | PRN

## 2013-04-07 MED ORDER — TERBUTALINE SULFATE 1 MG/ML IJ SOLN: 0.2500 mg | Freq: Once | INTRAMUSCULAR | Status: AC | PRN

## 2013-04-07 MED ORDER — OXYCODONE-ACETAMINOPHEN 5-325 MG PO TABS: 1.0000 | ORAL_TABLET | ORAL | Status: DC | PRN

## 2013-04-07 MED ORDER — LIDOCAINE HCL (PF) 1 % IJ SOLN
30.0000 mL | INTRAMUSCULAR | Status: DC | PRN
  Filled 2013-04-07 (×2): qty 30

## 2013-04-07 MED ORDER — ACETAMINOPHEN 325 MG PO TABS
650.0000 mg | ORAL_TABLET | ORAL | Status: DC | PRN
  Administered 2013-04-08: 650 mg via ORAL
  Filled 2013-04-07: qty 2

## 2013-04-07 MED ORDER — OXYTOCIN BOLUS FROM INFUSION
500.0000 mL | INTRAVENOUS | Status: DC
  Administered 2013-04-08: 500 mL via INTRAVENOUS

## 2013-04-07 MED ORDER — IBUPROFEN 600 MG PO TABS: 600.0000 mg | ORAL_TABLET | Freq: Four times a day (QID) | ORAL | Status: DC | PRN

## 2013-04-07 MED ORDER — LACTATED RINGERS IV SOLN: 500.0000 mL | INTRAVENOUS | Status: DC | PRN

## 2013-04-07 MED ORDER — FLEET ENEMA 7-19 GM/118ML RE ENEM: 1.0000 | ENEMA | RECTAL | Status: DC | PRN

## 2013-04-07 MED ORDER — OXYTOCIN 40 UNITS IN LACTATED RINGERS INFUSION - SIMPLE MED
62.5000 mL/h | INTRAVENOUS | Status: DC
  Filled 2013-04-07: qty 1000

## 2013-04-07 MED ORDER — LACTATED RINGERS IV SOLN
INTRAVENOUS | Status: DC
  Administered 2013-04-07 – 2013-04-08 (×4): via INTRAVENOUS

## 2013-04-07 MED ORDER — ONDANSETRON HCL 4 MG/2ML IJ SOLN
4.0000 mg | Freq: Four times a day (QID) | INTRAMUSCULAR | Status: DC | PRN
  Administered 2013-04-08: 4 mg via INTRAVENOUS
  Filled 2013-04-07: qty 2

## 2013-04-07 MED ORDER — MISOPROSTOL 25 MCG QUARTER TABLET
25.0000 ug | ORAL_TABLET | ORAL | Status: DC | PRN
  Administered 2013-04-07 – 2013-04-08 (×2): 25 ug via VAGINAL
  Filled 2013-04-07: qty 0.25
  Filled 2013-04-07: qty 1
  Filled 2013-04-07: qty 0.25

## 2013-04-07 NOTE — Progress Notes (Signed)
cytotec placed per orders

## 2013-04-08 ENCOUNTER — Encounter (HOSPITAL_COMMUNITY): Payer: Self-pay

## 2013-04-08 ENCOUNTER — Inpatient Hospital Stay (HOSPITAL_COMMUNITY): Payer: PRIVATE HEALTH INSURANCE | Admitting: Anesthesiology

## 2013-04-08 ENCOUNTER — Encounter (HOSPITAL_COMMUNITY): Payer: Self-pay | Admitting: Anesthesiology

## 2013-04-08 LAB — RPR: RPR Ser Ql: NONREACTIVE

## 2013-04-08 MED ORDER — IBUPROFEN 600 MG PO TABS
600.0000 mg | ORAL_TABLET | Freq: Four times a day (QID) | ORAL | Status: DC
  Administered 2013-04-08 – 2013-04-10 (×6): 600 mg via ORAL
  Filled 2013-04-08 (×6): qty 1

## 2013-04-08 MED ORDER — PENICILLIN G POTASSIUM 5000000 UNITS IJ SOLR
5.0000 10*6.[IU] | Freq: Once | INTRAVENOUS | Status: AC
  Administered 2013-04-08: 5 10*6.[IU] via INTRAVENOUS
  Filled 2013-04-08 (×2): qty 5

## 2013-04-08 MED ORDER — BISACODYL 10 MG RE SUPP: 10.0000 mg | Freq: Every day | RECTAL | Status: DC | PRN

## 2013-04-08 MED ORDER — LACTATED RINGERS IV SOLN
500.0000 mL | Freq: Once | INTRAVENOUS | Status: DC
Start: 2013-04-08 — End: 2013-04-08

## 2013-04-08 MED ORDER — ZOLPIDEM TARTRATE 5 MG PO TABS: 5.0000 mg | ORAL_TABLET | Freq: Every evening | ORAL | Status: DC | PRN

## 2013-04-08 MED ORDER — PHENYLEPHRINE 40 MCG/ML (10ML) SYRINGE FOR IV PUSH (FOR BLOOD PRESSURE SUPPORT)
80.0000 ug | PREFILLED_SYRINGE | INTRAVENOUS | Status: DC | PRN
  Filled 2013-04-08: qty 2

## 2013-04-08 MED ORDER — OXYTOCIN 40 UNITS IN LACTATED RINGERS INFUSION - SIMPLE MED
1.0000 m[IU]/min | INTRAVENOUS | Status: DC
  Administered 2013-04-08: 2 m[IU]/min via INTRAVENOUS

## 2013-04-08 MED ORDER — PENICILLIN G POTASSIUM 5000000 UNITS IJ SOLR
2.5000 10*6.[IU] | INTRAVENOUS | Status: DC
  Administered 2013-04-08 (×2): 2.5 10*6.[IU] via INTRAVENOUS
  Filled 2013-04-08 (×5): qty 2.5

## 2013-04-08 MED ORDER — SIMETHICONE 80 MG PO CHEW: 80.0000 mg | CHEWABLE_TABLET | ORAL | Status: DC | PRN

## 2013-04-08 MED ORDER — ONDANSETRON HCL 4 MG PO TABS: 4.0000 mg | ORAL_TABLET | ORAL | Status: DC | PRN

## 2013-04-08 MED ORDER — DIPHENHYDRAMINE HCL 25 MG PO CAPS: 25.0000 mg | ORAL_CAPSULE | Freq: Four times a day (QID) | ORAL | Status: DC | PRN

## 2013-04-08 MED ORDER — DIPHENHYDRAMINE HCL 50 MG/ML IJ SOLN: 12.5000 mg | INTRAMUSCULAR | Status: DC | PRN

## 2013-04-08 MED ORDER — PHENYLEPHRINE 40 MCG/ML (10ML) SYRINGE FOR IV PUSH (FOR BLOOD PRESSURE SUPPORT)
80.0000 ug | PREFILLED_SYRINGE | INTRAVENOUS | Status: DC | PRN
  Filled 2013-04-08: qty 5
  Filled 2013-04-08: qty 2

## 2013-04-08 MED ORDER — EPHEDRINE 5 MG/ML INJ
10.0000 mg | INTRAVENOUS | Status: DC | PRN
  Filled 2013-04-08: qty 2

## 2013-04-08 MED ORDER — PRENATAL MULTIVITAMIN CH
1.0000 | ORAL_TABLET | Freq: Every day | ORAL | Status: DC
  Administered 2013-04-09: 1 via ORAL
  Filled 2013-04-08: qty 1

## 2013-04-08 MED ORDER — BENZOCAINE-MENTHOL 20-0.5 % EX AERO
1.0000 "application " | INHALATION_SPRAY | CUTANEOUS | Status: DC | PRN
  Administered 2013-04-09: 1 via TOPICAL
  Filled 2013-04-08: qty 56

## 2013-04-08 MED ORDER — LIDOCAINE HCL (PF) 1 % IJ SOLN
INTRAMUSCULAR | Status: DC | PRN
  Administered 2013-04-08: 5 mL
  Administered 2013-04-08: 30 mL
  Administered 2013-04-08: 5 mL

## 2013-04-08 MED ORDER — EPHEDRINE 5 MG/ML INJ
10.0000 mg | INTRAVENOUS | Status: DC | PRN
  Filled 2013-04-08: qty 4
  Filled 2013-04-08: qty 2

## 2013-04-08 MED ORDER — FENTANYL 2.5 MCG/ML BUPIVACAINE 1/10 % EPIDURAL INFUSION (WH - ANES)
14.0000 mL/h | INTRAMUSCULAR | Status: DC | PRN
  Administered 2013-04-08: 14 mL/h via EPIDURAL
  Filled 2013-04-08: qty 125

## 2013-04-08 MED ORDER — TETANUS-DIPHTH-ACELL PERTUSSIS 5-2.5-18.5 LF-MCG/0.5 IM SUSP: 0.5000 mL | Freq: Once | INTRAMUSCULAR | Status: DC

## 2013-04-08 MED ORDER — BUTORPHANOL TARTRATE 1 MG/ML IJ SOLN
1.0000 mg | Freq: Once | INTRAMUSCULAR | Status: AC
  Administered 2013-04-08: 1 mg via INTRAVENOUS
  Filled 2013-04-08: qty 1

## 2013-04-08 MED ORDER — OXYCODONE-ACETAMINOPHEN 5-325 MG PO TABS
1.0000 | ORAL_TABLET | ORAL | Status: DC | PRN
  Administered 2013-04-09 (×4): 1 via ORAL
  Administered 2013-04-10: 2 via ORAL
  Filled 2013-04-08 (×2): qty 1
  Filled 2013-04-08: qty 2
  Filled 2013-04-08 (×2): qty 1

## 2013-04-08 MED ORDER — SENNOSIDES-DOCUSATE SODIUM 8.6-50 MG PO TABS
2.0000 | ORAL_TABLET | Freq: Every day | ORAL | Status: DC
  Administered 2013-04-09: 2 via ORAL

## 2013-04-08 MED ORDER — HYDROCORTISONE 1 % EX CREA
TOPICAL_CREAM | CUTANEOUS | Status: DC | PRN
  Filled 2013-04-08: qty 28

## 2013-04-08 MED ORDER — LANOLIN HYDROUS EX OINT: TOPICAL_OINTMENT | CUTANEOUS | Status: DC | PRN

## 2013-04-08 MED ORDER — FLEET ENEMA 7-19 GM/118ML RE ENEM: 1.0000 | ENEMA | Freq: Every day | RECTAL | Status: DC | PRN

## 2013-04-08 MED ORDER — WITCH HAZEL-GLYCERIN EX PADS: 1.0000 "application " | MEDICATED_PAD | CUTANEOUS | Status: DC | PRN

## 2013-04-08 MED ORDER — DIBUCAINE 1 % RE OINT: 1.0000 "application " | TOPICAL_OINTMENT | RECTAL | Status: DC | PRN

## 2013-04-08 MED ORDER — PROMETHAZINE HCL 25 MG/ML IJ SOLN
12.5000 mg | Freq: Four times a day (QID) | INTRAMUSCULAR | Status: DC | PRN
  Administered 2013-04-08: 12.5 mg via INTRAVENOUS
  Filled 2013-04-08: qty 1

## 2013-04-08 MED ORDER — ONDANSETRON HCL 4 MG/2ML IJ SOLN: 4.0000 mg | INTRAMUSCULAR | Status: DC | PRN

## 2013-04-08 MED ORDER — TERBUTALINE SULFATE 1 MG/ML IJ SOLN: 0.2500 mg | Freq: Once | INTRAMUSCULAR | Status: DC | PRN

## 2013-04-08 NOTE — Progress Notes (Signed)
Pt states she has rash under left armpit.  States she has cortisone cream she has been using at home but forgot it.  Dr. Marcelle Overlie called, orders received for cortisone cream.

## 2013-04-08 NOTE — Anesthesia Procedure Notes (Signed)
Epidural Patient location during procedure: OB Start time: 04/08/2013 1:47 PM  Staffing Anesthesiologist: Angus Seller., Harrell Gave. Performed by: anesthesiologist   Preanesthetic Checklist Completed: patient identified, site marked, surgical consent, pre-op evaluation, timeout performed, IV checked, risks and benefits discussed and monitors and equipment checked  Epidural Patient position: sitting Prep: site prepped and draped and DuraPrep Patient monitoring: continuous pulse ox and blood pressure Approach: midline Injection technique: LOR air and LOR saline  Needle:  Needle type: Tuohy  Needle gauge: 17 G Needle length: 9 cm and 9 Needle insertion depth: 5 cm cm Catheter type: closed end flexible Catheter size: 19 Gauge Catheter at skin depth: 10 cm Test dose: negative  Assessment Events: blood not aspirated, injection not painful, no injection resistance, negative IV test and no paresthesia  Additional Notes Patient identified.  Risk benefits discussed including failed block, incomplete pain control, headache, nerve damage, paralysis, blood pressure changes, nausea, vomiting, reactions to medication both toxic or allergic, and postpartum back pain.  Patient expressed understanding and wished to proceed.  All questions were answered.  Sterile technique used throughout procedure and epidural site dressed with sterile barrier dressing. No paresthesia or other complications noted.The patient did not experience any signs of intravascular injection such as tinnitus or metallic taste in mouth nor signs of intrathecal spread such as rapid motor block. Please see nursing notes for vital signs.

## 2013-04-08 NOTE — Anesthesia Preprocedure Evaluation (Signed)

## 2013-04-08 NOTE — H&P (Signed)
Stacey Compton is a 27 y.o. female presenting  At 40.4 for induction of labor.  Positive GBS. Maternal Medical History:  Fetal activity: Perceived fetal activity is normal.    Prenatal complications: Post GBS  Prenatal Complications - Diabetes: none.    OB History   Grav Para Term Preterm Abortions TAB SAB Ect Mult Living   1              Past Medical History  Diagnosis Date  . Endometriosis   . Migraines     with aura  . Hx of varicella   . IBS (irritable bowel syndrome)   . Anxiety   . Depression    Past Surgical History  Procedure Laterality Date  . Laparoscopy      with chromopertubation   Family History: family history includes Anxiety disorder in her sister; Bipolar disorder in her maternal aunt and maternal grandfather; Cancer in her maternal grandmother; Depression in her father; Fibromyalgia in her mother; and Heart disease in her paternal grandfather. Social History:  reports that she has never smoked. She has never used smokeless tobacco. She reports that she does not drink alcohol or use illicit drugs.   Prenatal Transfer Tool  Maternal Diabetes: No Genetic Screening: Normal Maternal Ultrasounds/Referrals: Normal Fetal Ultrasounds or other Referrals:  None Maternal Substance Abuse:  none Significant Maternal Medications:  None Significant Maternal Lab Results:  Lab values include: Group B Strep positive Other Comments:  None  ROS  Dilation: 2 Effacement (%): 50 Station: -2 Exam by:: Dr. Arelia Sneddon Blood pressure 105/54, pulse 53, temperature 98.2 F (36.8 C), temperature source Oral, resp. rate 20, height 5\' 3"  (1.6 m), weight 74.39 kg (164 lb). Maternal Exam:  Uterine Assessment: Contraction strength is mild.  Contraction frequency is regular.   Abdomen: Fundal height is c/w dates.   Estimated fetal weight is 7.5.   Fetal presentation: vertex  Introitus: Amniotic fluid character: clear.  Pelvis: adequate for delivery.   Cervix: Cervix evaluated  by digital exam.   2 cm  50 % vtx -1  Physical Exam  Prenatal labs: ABO, Rh: A/Positive/-- (11/01 0000) Antibody:   Rubella: Immune (11/01 0000) RPR: NON REACTIVE (06/01 2035)  HBsAg: Negative (11/01 0000)  HIV: Non-reactive (11/01 0000)  GBS: Positive (05/01 0000)   Assessment/Plan: Intrauterine pregnancy at 40.4 for induction Risk of pitocin discussed   Hamid Brookens S 04/08/2013, 7:53 AM

## 2013-04-09 LAB — CBC
HCT: 32.5 % — ABNORMAL LOW (ref 36.0–46.0)
Hemoglobin: 11.1 g/dL — ABNORMAL LOW (ref 12.0–15.0)
MCH: 32.6 pg (ref 26.0–34.0)
MCHC: 34.2 g/dL (ref 30.0–36.0)
MCV: 95.3 fL (ref 78.0–100.0)
Platelets: 180 10*3/uL (ref 150–400)
RBC: 3.41 MIL/uL — ABNORMAL LOW (ref 3.87–5.11)
RDW: 14.3 % (ref 11.5–15.5)
WBC: 15.6 10*3/uL — ABNORMAL HIGH (ref 4.0–10.5)

## 2013-04-09 NOTE — Anesthesia Postprocedure Evaluation (Signed)
  Anesthesia Post-op Note  Patient: Archivist  Procedure(s) Performed: * No procedures listed *  Patient Location: Mother/Baby  Anesthesia Type:Epidural  Level of Consciousness: alert   Airway and Oxygen Therapy: Patient Spontanous Breathing  Post-op Pain: none  Post-op Assessment: Patient's Cardiovascular Status Stable, Respiratory Function Stable, Patent Airway, No signs of Nausea or vomiting, Adequate PO intake, Pain level controlled, No headache, No backache, No residual numbness and No residual motor weakness  Post-op Vital Signs: Reviewed and stable  Complications: No apparent anesthesia complications

## 2013-04-09 NOTE — Progress Notes (Signed)
Patient was referred for history of depression/anxiety. * Referral screened out by Clinical Social Worker because none of the following criteria appear to apply:  ~ History of anxiety/depression during this pregnancy, or of post-partum depression.  ~ Diagnosis of anxiety and/or depression within last 3 years, as per pt.  ~ History of depression due to pregnancy loss/loss of child  OR * Patient's symptoms currently being treated with medication and/or therapy.  Please contact the Clinical Social Worker if needs arise, or by the patient's request.  

## 2013-04-09 NOTE — Progress Notes (Signed)
Post Partum Day 1 Subjective: no complaints, up ad lib, voiding, tolerating PO, + flatus and desires baby circ  Objective: Blood pressure 96/59, pulse 76, temperature 98.2 F (36.8 C), temperature source Oral, resp. rate 20, height 5\' 3"  (1.6 m), weight 164 lb (74.39 kg), SpO2 100.00%, unknown if currently breastfeeding.  Physical Exam:  General: alert and cooperative Lochia: appropriate Uterine Fundus: firm Incision: perineum intact, no labial edema noted DVT Evaluation: No evidence of DVT seen on physical exam. Negative Homan's sign. No cords or calf tenderness.   Recent Labs  04/07/13 2035 04/09/13 0620  HGB 13.6 11.1*  HCT 39.8 32.5*    Assessment/Plan: Plan for discharge tomorrow and Circumcision prior to discharge   LOS: 2 days   CURTIS,CAROL G 04/09/2013, 7:59 AM

## 2013-04-10 MED ORDER — IBUPROFEN 600 MG PO TABS: 600.0000 mg | ORAL_TABLET | Freq: Four times a day (QID) | ORAL | Status: DC

## 2013-04-10 MED ORDER — OXYCODONE-ACETAMINOPHEN 5-325 MG PO TABS: 1.0000 | ORAL_TABLET | ORAL | Status: DC | PRN

## 2013-04-10 NOTE — Plan of Care (Signed)
Problem: Discharge Progression Outcomes Goal: Other Discharge Outcomes/Goals follow up with lactation dept. For sore nipples, decreased urine output with baby.

## 2013-04-10 NOTE — Discharge Summary (Signed)
Obstetric Discharge Summary Reason for Admission: induction of labor Prenatal Procedures: ultrasound Intrapartum Procedures: vacuum Postpartum Procedures: none Complications-Operative and Postpartum: 2 degree perineal laceration Hemoglobin  Date Value Range Status  04/09/2013 11.1* 12.0 - 15.0 g/dL Final     DELTA CHECK NOTED     REPEATED TO VERIFY     HCT  Date Value Range Status  04/09/2013 32.5* 36.0 - 46.0 % Final    Physical Exam:  General: alert and cooperative Lochia: appropriate Uterine Fundus: firm Incision: perineum intact DVT Evaluation: No evidence of DVT seen on physical exam. Negative Homan's sign. No cords or calf tenderness. No significant calf/ankle edema.  Discharge Diagnoses: Term Pregnancy-delivered  Discharge Information: Date: 04/10/2013 Activity: pelvic rest Diet: routine Medications: PNV, Ibuprofen and Percocet Condition: stable Instructions: refer to practice specific booklet Discharge to: home   Newborn Data: Live born female  Birth Weight: 7 lb 12.7 oz (3535 g) APGAR: 8, 9  Home with mother.  Halla Chopp G 04/10/2013, 8:01 AM

## 2013-06-07 ENCOUNTER — Encounter: Payer: Self-pay | Admitting: Family Medicine

## 2013-06-07 ENCOUNTER — Ambulatory Visit (INDEPENDENT_AMBULATORY_CARE_PROVIDER_SITE_OTHER): Payer: PRIVATE HEALTH INSURANCE | Admitting: Family Medicine

## 2013-06-07 VITALS — BP 100/70 | HR 80 | Wt 141.8 lb

## 2013-06-07 DIAGNOSIS — N644 Mastodynia: Secondary | ICD-10-CM

## 2013-06-07 NOTE — Progress Notes (Signed)
  Subjective:    Patient ID: Stacey Compton, female    DOB: 1986/09/14, 27 y.o.   MRN: [id number]  HPI Patient complains of mole on right breast that is sore and hanging off Patient has a small skin tag near the area lot on the right breast it became irritated and bled. Patient would like to have it removed. It does not appear to be infected. No fevers. Review of Systems See above.    Objective:   Physical Exam Patient has a small skin tag near the right area along the right side measures approximately 4 mm in length. No sign of cellulitis.  After consent was given area was cleansed with rubbing alcohol epinephrine/lidocaine small amount given subcutaneous this skin tag was clipped off without difficulty.       Assessment & Plan:  Skin tag-care were was given. How to take care of that ongoing also discussed. Warning signs discussed. Await pathology more than likely benign.

## 2013-07-25 ENCOUNTER — Encounter: Payer: Self-pay | Admitting: Family Medicine

## 2013-08-02 ENCOUNTER — Ambulatory Visit (INDEPENDENT_AMBULATORY_CARE_PROVIDER_SITE_OTHER): Payer: PRIVATE HEALTH INSURANCE | Admitting: Nurse Practitioner

## 2013-08-02 ENCOUNTER — Encounter: Payer: Self-pay | Admitting: Family Medicine

## 2013-08-02 ENCOUNTER — Ambulatory Visit: Payer: PRIVATE HEALTH INSURANCE | Admitting: Family Medicine

## 2013-08-02 ENCOUNTER — Telehealth: Payer: Self-pay | Admitting: Family Medicine

## 2013-08-02 VITALS — BP 100/62 | Ht 63.0 in | Wt 137.0 lb

## 2013-08-02 DIAGNOSIS — M778 Other enthesopathies, not elsewhere classified: Secondary | ICD-10-CM

## 2013-08-02 DIAGNOSIS — M62838 Other muscle spasm: Secondary | ICD-10-CM

## 2013-08-02 DIAGNOSIS — M67919 Unspecified disorder of synovium and tendon, unspecified shoulder: Secondary | ICD-10-CM

## 2013-08-02 MED ORDER — IBUPROFEN 800 MG PO TABS: 800.0000 mg | ORAL_TABLET | Freq: Three times a day (TID) | ORAL | Status: DC | PRN

## 2013-08-02 NOTE — Telephone Encounter (Signed)
Patient was under the impression when she left this morning that she was supposed to have two Rx's called in by Eber Jones, but the pharmacy only had the Ibuprofen. Please advise.    [facility]

## 2013-08-02 NOTE — Patient Instructions (Addendum)
Icy Hot Smart Relief 

## 2013-08-02 NOTE — Telephone Encounter (Signed)
Per Stacey Compton she checked with the pharmacy and there is no muscle relaxer that can be taken while breast feeding. Patient notified.

## 2013-08-08 ENCOUNTER — Ambulatory Visit: Payer: PRIVATE HEALTH INSURANCE | Admitting: Family Medicine

## 2013-08-08 ENCOUNTER — Encounter: Payer: Self-pay | Admitting: Nurse Practitioner

## 2013-08-08 DIAGNOSIS — M778 Other enthesopathies, not elsewhere classified: Secondary | ICD-10-CM | POA: Insufficient documentation

## 2013-08-08 DIAGNOSIS — M62838 Other muscle spasm: Secondary | ICD-10-CM | POA: Insufficient documentation

## 2013-08-08 NOTE — Assessment & Plan Note (Signed)
.   ibuprofen (ADVIL,MOTRIN) 800 MG tablet    Sig: Take 1 tablet (800 mg total) by mouth every 8 (eight) hours as needed for pain.    Dispense:  30 tablet    Refill:  0    Order Specific Question:  Supervising Provider    Answer:  LUKING, WILLIAM S [[id number]]   Recommend stretching exercises, TENS unit and massage therapy. Call back in 10-14 days if no improvement, sooner if worse. 

## 2013-08-08 NOTE — Progress Notes (Signed)
Subjective:  Presents with complaints of right shoulder pain off-and-on for the past several months, worse over the past 2 weeks. No specific history of injury. For the past 6 days has had increased pain and off-and-on numbness and tingling in the right arm and occasionally in the fingertips. Slight weakness noted, more difficulty lifting her child. Radiates down into the upper right chest wall. No shortness of breath. No cough. No edema. Pain is worse when she lies on her right side.  Objective:   BP 100/62  Ht 5\' 3"  (1.6 m)  Wt 137 lb (62.143 kg)  BMI 24.27 kg/m2  Breastfeeding? Yes NAD. Alert, oriented. Lungs clear. Heart regular rate rhythm. Tight tender muscles noted along the right upper back area into the rhomboids. Also tenderness with palpation of the right upperterior chest wall. Distinct tenderness noted in the mid shoulder joint line and anterior shoulder joint line. Good active ROM of the shoulder with minimal tenderness. Hand and arm strength 5+. Radial pulses strong. Sensation grossly intact. Reflexes normal limit.  Assessment:Shoulder tendinitis, right  Muscle spasm of right shoulder  Plan: Meds ordered this encounter  Medications  . ibuprofen (ADVIL,MOTRIN) 800 MG tablet    Sig: Take 1 tablet (800 mg total) by mouth every 8 (eight) hours as needed for pain.    Dispense:  30 tablet    Refill:  0    Order Specific Question:  Supervising Provider    Answer:  Merlyn Albert [[id number]]   Recommend stretching exercises, TENS unit and massage therapy. Call back in 10-14 days if no improvement, sooner if worse.

## 2013-08-08 NOTE — Assessment & Plan Note (Signed)
.   ibuprofen (ADVIL,MOTRIN) 800 MG tablet    Sig: Take 1 tablet (800 mg total) by mouth every 8 (eight) hours as needed for pain.    Dispense:  30 tablet    Refill:  0    Order Specific Question:  Supervising Provider    Answer:  Merlyn Albert [[id number]]   Recommend stretching exercises, TENS unit and massage therapy. Call back in 10-14 days if no improvement, sooner if worse.

## 2013-08-09 ENCOUNTER — Ambulatory Visit: Payer: PRIVATE HEALTH INSURANCE | Admitting: Family Medicine

## 2013-10-02 ENCOUNTER — Emergency Department (HOSPITAL_COMMUNITY): Payer: PRIVATE HEALTH INSURANCE

## 2013-10-02 ENCOUNTER — Emergency Department (HOSPITAL_COMMUNITY)
Admission: EM | Admit: 2013-10-02 | Discharge: 2013-10-02 | Disposition: A | Discharge status: Home / Self Care | Payer: PRIVATE HEALTH INSURANCE | Attending: Emergency Medicine | Admitting: Emergency Medicine

## 2013-10-02 ENCOUNTER — Telehealth: Payer: Self-pay | Admitting: Family Medicine

## 2013-10-02 ENCOUNTER — Encounter (HOSPITAL_COMMUNITY): Payer: Self-pay | Admitting: Emergency Medicine

## 2013-10-02 DIAGNOSIS — R200 Anesthesia of skin: Secondary | ICD-10-CM

## 2013-10-02 DIAGNOSIS — Z8659 Personal history of other mental and behavioral disorders: Secondary | ICD-10-CM | POA: Insufficient documentation

## 2013-10-02 DIAGNOSIS — R51 Headache: Secondary | ICD-10-CM | POA: Insufficient documentation

## 2013-10-02 DIAGNOSIS — Z8742 Personal history of other diseases of the female genital tract: Secondary | ICD-10-CM | POA: Insufficient documentation

## 2013-10-02 DIAGNOSIS — H53149 Visual discomfort, unspecified: Secondary | ICD-10-CM | POA: Insufficient documentation

## 2013-10-02 DIAGNOSIS — R209 Unspecified disturbances of skin sensation: Secondary | ICD-10-CM | POA: Insufficient documentation

## 2013-10-02 DIAGNOSIS — Z8679 Personal history of other diseases of the circulatory system: Secondary | ICD-10-CM | POA: Insufficient documentation

## 2013-10-02 DIAGNOSIS — Z8719 Personal history of other diseases of the digestive system: Secondary | ICD-10-CM | POA: Insufficient documentation

## 2013-10-02 DIAGNOSIS — Z8619 Personal history of other infectious and parasitic diseases: Secondary | ICD-10-CM | POA: Insufficient documentation

## 2013-10-02 DIAGNOSIS — R519 Headache, unspecified: Secondary | ICD-10-CM

## 2013-10-02 LAB — POCT I-STAT, CHEM 8
BUN: 10 mg/dL (ref 6–23)
Calcium, Ion: 1.05 mmol/L — ABNORMAL LOW (ref 1.12–1.23)
Chloride: 108 mEq/L (ref 96–112)
Creatinine, Ser: 0.7 mg/dL (ref 0.50–1.10)
Glucose, Bld: 77 mg/dL (ref 70–99)
HCT: 39 % (ref 36.0–46.0)
Hemoglobin: 13.3 g/dL (ref 12.0–15.0)
Potassium: 3.7 mEq/L (ref 3.5–5.1)
Sodium: 142 mEq/L (ref 135–145)
TCO2: 21 mmol/L (ref 0–100)

## 2013-10-02 MED ORDER — SODIUM CHLORIDE 0.9 % IV SOLN
1000.0000 mL | INTRAVENOUS | Status: DC
  Administered 2013-10-02: 1000 mL via INTRAVENOUS

## 2013-10-02 MED ORDER — DIPHENHYDRAMINE HCL 50 MG/ML IJ SOLN
25.0000 mg | Freq: Once | INTRAMUSCULAR | Status: AC
  Administered 2013-10-02: 25 mg via INTRAVENOUS
  Filled 2013-10-02: qty 1

## 2013-10-02 MED ORDER — SODIUM CHLORIDE 0.9 % IV SOLN
1000.0000 mL | Freq: Once | INTRAVENOUS | Status: AC
  Administered 2013-10-02: 1000 mL via INTRAVENOUS

## 2013-10-02 MED ORDER — KETOROLAC TROMETHAMINE 30 MG/ML IJ SOLN
30.0000 mg | Freq: Once | INTRAMUSCULAR | Status: AC
  Administered 2013-10-02: 30 mg via INTRAVENOUS
  Filled 2013-10-02: qty 1

## 2013-10-02 MED ORDER — SODIUM CHLORIDE 0.9 % IV BOLUS (SEPSIS)
1000.0000 mL | Freq: Once | INTRAVENOUS | Status: AC
  Administered 2013-10-02: 1000 mL via INTRAVENOUS

## 2013-10-02 MED ORDER — METOCLOPRAMIDE HCL 5 MG/ML IJ SOLN
10.0000 mg | Freq: Once | INTRAMUSCULAR | Status: AC
  Administered 2013-10-02: 10 mg via INTRAVENOUS
  Filled 2013-10-02: qty 2

## 2013-10-02 MED ORDER — ONDANSETRON 4 MG PO TBDP: 4.0000 mg | ORAL_TABLET | Freq: Three times a day (TID) | ORAL | Status: DC | PRN

## 2013-10-02 NOTE — Telephone Encounter (Signed)
Can not use during breastfeeding, will need to discuss symptoms if lingering neurologic sx then NTBS

## 2013-10-02 NOTE — ED Provider Notes (Signed)
CSN: [id number]     Arrival date & time 10/02/13  1533 History  This chart was scribed for Ward Givens, MD by Shari Heritage, ED Scribe. The patient was seen in room APA15/APA15. Patient's care was started at 4:15 PM.    Chief Complaint  Patient presents with  . Dizziness    The history is provided by the patient. No language interpreter was used.    HPI Comments: Stacey Compton is a 27 y.o. female with history of migraines, IBS, anxiety and depression who presents to the Emergency Department complaining of sudden onset lightheadedness and dizziness that began about 4 hours ago around noon while at work. She says that dizziness did not feel like the room was spinning, but felt unsteady on her feet. She reports that she ate breakfast, but thought the lightheadedness may have been due to being hungry so she ate a granola bar and drank some water. She says that she also developed photophobia and thought that she was getting a migraine so she left work and went home. She denies headache. Upon getting home, she began having numbness and tingling in her left arm, left leg, left foot and the left side of her face. She reports today that she does not have migraines very often, but in the past they have had an aura of bright light. She has also had hand numbness with migraines, and she says current numbness and tingling is unusual because it has never spread beyond her hands and has not lasted this long. She says that she has been able to ambulate, but states that at times her left knee feels like it is "asleep" and she doesn't feel like she has "full control over it" and feels like it is going to give out on her. She has a prescription for Maxalt with Dr. Gerda Diss and she tried to call in a refill prior to coming to the ED, but she was advised not to take this while breastfeeding (she delivered inJune). Dr. Gerda Diss asked her to come to the ED for further evaluation. Currently she states that photophobia,  lightheadedness and numbness have improved but now she feels groggy and sluggish. She also states that she has been having intermittent brief periods when her vision seems blurred, but then returns to normal. She denies chest pain, shortness of breath, cough, sneezing, vomiting, diarrhea and nausea. She is on no birth control of any kind. She has a medical history of endometriosis and had a laparoscopy and hormone replacement therapy before becoming pregnant. She has a family history of headaches and possible history of stroke in her grandfather. She states that her mother's first cousin (female) had a stroke in her 30s and it is not known whether she was on OCP at the time.  PCP Dr Gerda Diss   Past Medical History  Diagnosis Date  . Endometriosis   . Migraines     with aura  . Hx of varicella   . IBS (irritable bowel syndrome)   . Anxiety   . Depression    Past Surgical History  Procedure Laterality Date  . Laparoscopy      with chromopertubation   Family History  Problem Relation Age of Onset  . Fibromyalgia Mother   . Depression Father   . Anxiety disorder Sister   . Bipolar disorder Maternal Aunt   . Cancer Maternal Grandmother     mouth and lung  . Bipolar disorder Maternal Grandfather   . Heart disease Paternal Grandfather  History  Substance Use Topics  . Smoking status: Never Smoker   . Smokeless tobacco: Never Used  . 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   OB History   Grav Para Term Preterm Abortions TAB SAB Ect Mult Living   1 1 1       1      Review of Systems  HENT: Negative for sneezing.   Eyes: Positive for photophobia.  Respiratory: Negative for cough and shortness of breath.   Cardiovascular: Negative for chest pain.  Gastrointestinal: Negative for nausea and vomiting.  Neurological: Positive for dizziness, light-headedness and numbness. Negative for headaches.  All other systems reviewed and are  negative.    Allergies  Percocet  Home Medications   Current Outpatient Rx  Name  Route  Sig  Dispense  Refill  . ibuprofen (ADVIL,MOTRIN) 800 MG tablet   Oral   Take 1 tablet (800 mg total) by mouth every 8 (eight) hours as needed for pain.   30 tablet   0   . Prenatal Vit-Fe Fumarate-FA (PRENATAL MULTIVITAMIN) TABS   Oral   Take 1 tablet by mouth at bedtime.          Triage Vitals: BP 101/67  Pulse 74  Temp(Src) 98.3 F (36.8 C) (Oral)  Resp 20  Ht 5\' 3"  (1.6 m)  Wt 132 lb (59.875 kg)  BMI 23.39 kg/m2  SpO2 98%  LMP 05/02/2013  Breastfeeding? Yes  Vital signs normal   Orthostatic vital signs show her pulse goes from 69 on lying to 109 on standing and her blood pressure goes from 102/63-99/69 standing.  Physical Exam  Nursing note and vitals reviewed. Constitutional: She is oriented to person, place, and time. She appears well-developed and well-nourished.  Non-toxic appearance. She does not appear ill. No distress.  HENT:  Head: Normocephalic and atraumatic.  Right Ear: External ear normal.  Left Ear: External ear normal.  Nose: Nose normal. No mucosal edema or rhinorrhea.  Mouth/Throat: Oropharynx is clear and moist and mucous membranes are normal. No dental abscesses or uvula swelling.  Eyes: Conjunctivae and EOM are normal. Pupils are equal, round, and reactive to light.  Neck: Normal range of motion and full passive range of motion without pain. Neck supple.  Cardiovascular: Normal rate, regular rhythm and normal heart sounds.  Exam reveals no gallop and no friction rub.   No murmur heard. Pulmonary/Chest: Effort normal and breath sounds normal. No respiratory distress. She has no wheezes. She has no rhonchi. She has no rales. She exhibits no tenderness and no crepitus.  Abdominal: Soft. Normal appearance and bowel sounds are normal. She exhibits no distension. There is no tenderness. There is no rebound and no guarding.  Musculoskeletal: Normal range of  motion. She exhibits no edema and no tenderness.  Moves all extremities well.   Neurological: She is alert and oriented to person, place, and time. No cranial nerve deficit.  Motor strength normal. No pronator drift. No ataxia on finger to nose bilaterally. Finger to nose equal bilaterally. Grip strength equal. No facial asymmetry. Heel to shin normal bilaterally. Visual fields normal without visual field cuts.  Skin: Skin is warm, dry and intact. No rash noted. No erythema. No pallor.  Psychiatric: She has a normal mood and affect. Her speech is normal and behavior is normal. Her mood appears not anxious.    ED Course  Procedures (including critical care time)  Medications  0.9 %  sodium chloride  infusion (0 mLs Intravenous Stopped 10/02/13 1719)    Followed by  0.9 %  sodium chloride infusion (1,000 mLs Intravenous New Bag/Given 10/02/13 1644)  metoCLOPramide (REGLAN) injection 10 mg (10 mg Intravenous Given 10/02/13 1641)  diphenhydrAMINE (BENADRYL) injection 25 mg (25 mg Intravenous Given 10/02/13 1640)  sodium chloride 0.9 % bolus 1,000 mL (0 mLs Intravenous Stopped 10/02/13 1852)  sodium chloride 0.9 % bolus 1,000 mL (1,000 mLs Intravenous New Bag/Given 10/02/13 1852)  ketorolac (TORADOL) 30 MG/ML injection 30 mg (30 mg Intravenous Given 10/02/13 1850)    DIAGNOSTIC STUDIES: Oxygen Saturation is 98% on room air, normal by my interpretation.    COORDINATION OF CARE: 4:26 PM- Will order IV fluids, Reglan and Benadryl as well as an MRI brain without contrast and orthostatics. Patient informed of current plan for treatment and evaluation and agrees with plan at this time.   5:53 PM- Patient states that she feels more relaxed after medicines. Her standing BP was low and her pulse was elevated on standing, suggesting possible dehydration with contributing factor of breast feeding. Will wait for results of imaging study.  6:42 PM- Patient informed that MRI is negative. She states that  numbness is still present, dizziness has improved and she is now developing headache. Patient says that she maintains a good diet and drinks lots of water. Will give Toradol and order chem panel to check potassium.  7:46 PM- Patient updated on chem panel results which are all normal.  Patient states that numbness and headache are improved and she feels stable for discharge. Patient states that she has seen a neurologist in [place] in the past, but has not followed up within the last 4-5 years. States she would like to see someone more local.    Results for orders placed during the hospital encounter of 10/02/13  POCT I-STAT, CHEM 8      Result Value Range   Sodium 142  135 - 145 mEq/L   Potassium 3.7  3.5 - 5.1 mEq/L   Chloride 108  96 - 112 mEq/L   BUN 10  6 - 23 mg/dL   Creatinine, Ser 1.61  0.50 - 1.10 mg/dL   Glucose, Bld 77  70 - 99 mg/dL   Calcium, Ion 0.96 (*) 1.12 - 1.23 mmol/L   TCO2 21  0 - 100 mmol/L   Hemoglobin 13.3  12.0 - 15.0 g/dL   HCT 04.5  40.9 - 81.1 %   Laboratory interpretation all normal (done to make sure no electrolyte abnormality present)    Imaging Review Mr Brain Wo Contrast  10/02/2013   CLINICAL DATA:  Acute onset of dizziness with left facial numbness in left-sided body numbness.  EXAM: MRI HEAD WITHOUT CONTRAST  TECHNIQUE: Multiplanar, multiecho pulse sequences of the brain and surrounding structures were obtained without intravenous contrast.  COMPARISON:  MRI brain without with contrast 07/17/2009.  FINDINGS: The pineal gland is stable to slightly decreased in size. No soft tissue lesion is evident. Midline structures are otherwise within normal limits.  The diffusion-weighted images demonstrate no evidence for acute or subacute infarction. No hemorrhage or mass lesion is present. The ventricles are of normal size. No significant extra-axial fluid collection is present. Flow is present in the major intracranial arteries. The globes and orbits are intact.  The paranasal sinuses and mastoid air cells are clear.  IMPRESSION: Negative MRI of the brain.   Electronically Signed   By: Gennette Pac M.D.   On: 10/02/2013 18:01  EKG Interpretation   None       MDM   1. Numbness and tingling of left arm and leg   2. Left facial numbness   3. Headache    Discharge Medication List as of 10/02/2013  7:50 PM    START taking these medications   Details  ondansetron (ZOFRAN ODT) 4 MG disintegrating tablet Take 1 tablet (4 mg total) by mouth every 8 (eight) hours as needed for nausea or vomiting., Starting 10/02/2013, Until Discontinued, Print        Plan discharge  Devoria Albe, MD, FACEP   I personally performed the services described in this documentation, which was scribed in my presence. The recorded information has been reviewed and considered.  Devoria Albe, MD, Arm[facility]   Ward Givens, MD 10/02/13 2159

## 2013-10-02 NOTE — ED Notes (Addendum)
Numbness, tingling lt side of face, lt arm and leg ,onset this 1230 p. Thought she was starting to have a migraine, but pain in head did not develop.  Pt is breast feeding.  Pt says she had dizziness prior to numbness and left work. Ambulates without problem , Alert, oriented. Color wnl

## 2013-10-02 NOTE — Telephone Encounter (Signed)
Feeling dizzy started today numbness in left hand, arm, foot, and side of face, blurry vision

## 2013-10-02 NOTE — Telephone Encounter (Signed)
Pt advised to go directly to emergency room per Dr. Lorin Picket. Pt agreed to go to [facility] ER.

## 2013-10-02 NOTE — Telephone Encounter (Signed)
Patient wants to know if she can take Maxalt while breastfeeding for her migraines?  Also, patient says she is experiencing some numbness and tingling today that she has never experienced with her migraines. Please advise. Does patient need to come in today?   [facility] [address]

## 2013-12-23 ENCOUNTER — Encounter: Payer: Self-pay | Admitting: Family Medicine

## 2013-12-23 ENCOUNTER — Ambulatory Visit (INDEPENDENT_AMBULATORY_CARE_PROVIDER_SITE_OTHER): Payer: PRIVATE HEALTH INSURANCE | Admitting: Family Medicine

## 2013-12-23 VITALS — BP 100/60 | Temp 98.3°F | Ht 63.0 in | Wt 138.2 lb

## 2013-12-23 DIAGNOSIS — J209 Acute bronchitis, unspecified: Secondary | ICD-10-CM

## 2013-12-23 MED ORDER — CEFPROZIL 500 MG PO TABS: 500.0000 mg | ORAL_TABLET | Freq: Two times a day (BID) | ORAL | Status: DC

## 2013-12-23 NOTE — Progress Notes (Signed)
   Subjective:    Patient ID: Stacey Compton, female    DOB: Jan 28, 1986, 28 y.o.   MRN: [id number]  Cough This is a new problem. The current episode started in the past 7 days. The problem has been gradually worsening. The problem occurs constantly. The cough is productive of sputum. Associated symptoms include a fever and shortness of breath. Pertinent negatives include no chest pain, ear pain, rhinorrhea or wheezing. Nothing aggravates the symptoms. She has tried OTC cough suppressant for the symptoms. The treatment provided no relief.   The patient is breast-feeding.   Review of Systems  Constitutional: Positive for fever. Negative for activity change.  HENT: Negative for congestion, ear pain and rhinorrhea.   Eyes: Negative for discharge.  Respiratory: Positive for cough and shortness of breath. Negative for wheezing.   Cardiovascular: Negative for chest pain.       Objective:   Physical Exam  Nursing note and vitals reviewed. Constitutional: She appears well-developed.  HENT:  Head: Normocephalic.  Nose: Nose normal.  Mouth/Throat: Oropharynx is clear and moist. No oropharyngeal exudate.  Neck: Neck supple.  Cardiovascular: Normal rate and normal heart sounds.   No murmur heard. Pulmonary/Chest: Effort normal and breath sounds normal. She has no wheezes.  Lymphadenopathy:    She has no cervical adenopathy.  Skin: Skin is warm and dry.          Assessment & Plan:  Upper respiratory illness secondary sinusitis/bronchitis-Cefzil 10 days as prescribed. No sign of pneumonia or bacteremia no need for x-rays or blood work. Patient not toxic. Because of breast-feeding will not use Tessalon. Followup if progressive symptoms. Warning signs discussed.

## 2014-02-11 ENCOUNTER — Encounter: Payer: Self-pay | Admitting: Family Medicine

## 2014-02-11 ENCOUNTER — Ambulatory Visit (INDEPENDENT_AMBULATORY_CARE_PROVIDER_SITE_OTHER): Payer: PRIVATE HEALTH INSURANCE | Admitting: Family Medicine

## 2014-02-11 VITALS — BP 112/70 | Ht 63.0 in | Wt 131.0 lb

## 2014-02-11 DIAGNOSIS — J019 Acute sinusitis, unspecified: Secondary | ICD-10-CM

## 2014-02-11 DIAGNOSIS — J309 Allergic rhinitis, unspecified: Secondary | ICD-10-CM

## 2014-02-11 MED ORDER — FLUTICASONE PROPIONATE 50 MCG/ACT NA SUSP
2.0000 | Freq: Every day | NASAL | Status: DC
Start: 2014-02-11 — End: 2014-09-11

## 2014-02-11 MED ORDER — CEFPROZIL 500 MG PO TABS: 500.0000 mg | ORAL_TABLET | Freq: Two times a day (BID) | ORAL | Status: DC

## 2014-02-11 NOTE — Progress Notes (Signed)
   Subjective:    Patient ID: Stacey Compton, female    DOB: [birth year], 28 y.o.   MRN: [id number]  Cough This is a new problem. Episode onset: Friday. The cough is productive of sputum. Associated symptoms include chills, headaches, myalgias, nasal congestion and rhinorrhea. She has tried OTC cough suppressant (Allergy meds) for the symptoms. The treatment provided no relief.   Cough worse last night No wheeze Excessive drainage No fever Trying zyrtec and sudafed  Review of Systems  Constitutional: Positive for chills.  HENT: Positive for rhinorrhea.   Respiratory: Positive for cough.   Musculoskeletal: Positive for myalgias.  Neurological: Positive for headaches.       Objective:   Physical Exam  Nursing note and vitals reviewed. Constitutional: She appears well-developed.  HENT:  Head: Normocephalic.  Nose: Nose normal.  Mouth/Throat: Oropharynx is clear and moist. No oropharyngeal exudate.  Neck: Neck supple.  Cardiovascular: Normal rate and normal heart sounds.   No murmur heard. Pulmonary/Chest: Effort normal and breath sounds normal. She has no wheezes.  Lymphadenopathy:    She has no cervical adenopathy.  Skin: Skin is warm and dry.          Assessment & Plan:  #1 sinusitis antibiotics prescribed #2 allergies-use Flonase over-the-counter measures #3 if persistent problems or worse consider shot of Depo-Medrol

## 2014-09-08 ENCOUNTER — Encounter: Payer: Self-pay | Admitting: Family Medicine

## 2014-09-11 ENCOUNTER — Encounter (HOSPITAL_COMMUNITY): Payer: Self-pay | Admitting: Emergency Medicine

## 2014-09-11 ENCOUNTER — Ambulatory Visit (INDEPENDENT_AMBULATORY_CARE_PROVIDER_SITE_OTHER): Payer: PRIVATE HEALTH INSURANCE | Admitting: Family Medicine

## 2014-09-11 ENCOUNTER — Emergency Department (HOSPITAL_COMMUNITY)
Admission: EM | Admit: 2014-09-11 | Discharge: 2014-09-11 | Disposition: A | Discharge status: Home / Self Care | Payer: PRIVATE HEALTH INSURANCE | Attending: Emergency Medicine | Admitting: Emergency Medicine

## 2014-09-11 ENCOUNTER — Emergency Department (HOSPITAL_COMMUNITY): Payer: PRIVATE HEALTH INSURANCE

## 2014-09-11 ENCOUNTER — Encounter: Payer: Self-pay | Admitting: Family Medicine

## 2014-09-11 VITALS — BP 102/72 | Temp 98.2°F | Ht 63.0 in | Wt 134.0 lb

## 2014-09-11 DIAGNOSIS — Z8719 Personal history of other diseases of the digestive system: Secondary | ICD-10-CM | POA: Diagnosis not present

## 2014-09-11 DIAGNOSIS — Z8619 Personal history of other infectious and parasitic diseases: Secondary | ICD-10-CM | POA: Diagnosis not present

## 2014-09-11 DIAGNOSIS — Z3202 Encounter for pregnancy test, result negative: Secondary | ICD-10-CM | POA: Diagnosis not present

## 2014-09-11 DIAGNOSIS — Z8742 Personal history of other diseases of the female genital tract: Secondary | ICD-10-CM | POA: Diagnosis not present

## 2014-09-11 DIAGNOSIS — R109 Unspecified abdominal pain: Secondary | ICD-10-CM

## 2014-09-11 DIAGNOSIS — Z8659 Personal history of other mental and behavioral disorders: Secondary | ICD-10-CM | POA: Diagnosis not present

## 2014-09-11 DIAGNOSIS — Z9889 Other specified postprocedural states: Secondary | ICD-10-CM | POA: Diagnosis not present

## 2014-09-11 DIAGNOSIS — Z8679 Personal history of other diseases of the circulatory system: Secondary | ICD-10-CM | POA: Insufficient documentation

## 2014-09-11 DIAGNOSIS — R1032 Left lower quadrant pain: Secondary | ICD-10-CM | POA: Diagnosis not present

## 2014-09-11 LAB — CBC WITH DIFFERENTIAL/PLATELET
Basophils Absolute: 0 10*3/uL (ref 0.0–0.1)
Basophils Relative: 0 % (ref 0–1)
Eosinophils Absolute: 0.4 10*3/uL (ref 0.0–0.7)
Eosinophils Relative: 5 % (ref 0–5)
HCT: 37.3 % (ref 36.0–46.0)
Hemoglobin: 12.8 g/dL (ref 12.0–15.0)
Lymphocytes Relative: 30 % (ref 12–46)
Lymphs Abs: 2.6 10*3/uL (ref 0.7–4.0)
MCH: 31.2 pg (ref 26.0–34.0)
MCHC: 34.3 g/dL (ref 30.0–36.0)
MCV: 91 fL (ref 78.0–100.0)
Monocytes Absolute: 0.5 10*3/uL (ref 0.1–1.0)
Monocytes Relative: 6 % (ref 3–12)
Neutro Abs: 5.3 10*3/uL (ref 1.7–7.7)
Neutrophils Relative %: 59 % (ref 43–77)
Platelets: 274 10*3/uL (ref 150–400)
RBC: 4.1 MIL/uL (ref 3.87–5.11)
RDW: 12.3 % (ref 11.5–15.5)
WBC: 8.9 10*3/uL (ref 4.0–10.5)

## 2014-09-11 LAB — POCT URINALYSIS DIPSTICK
Spec Grav, UA: <=1.005
pH, UA: 6

## 2014-09-11 LAB — BASIC METABOLIC PANEL
Anion gap: 12 (ref 5–15)
BUN: 6 mg/dL (ref 6–23)
CO2: 27 mEq/L (ref 19–32)
Calcium: 9.6 mg/dL (ref 8.4–10.5)
Chloride: 101 mEq/L (ref 96–112)
Creatinine, Ser: 0.56 mg/dL (ref 0.50–1.10)
GFR calc Af Amer: >90 mL/min (ref >90)
GFR calc non Af Amer: >90 mL/min (ref >90)
Glucose, Bld: 93 mg/dL (ref 70–99)
Potassium: 3.6 mEq/L — ABNORMAL LOW (ref 3.7–5.3)
Sodium: 140 mEq/L (ref 137–147)

## 2014-09-11 LAB — URINALYSIS, ROUTINE W REFLEX MICROSCOPIC
Bilirubin Urine: NEGATIVE
Glucose, UA: NEGATIVE mg/dL
Hgb urine dipstick: NEGATIVE
Ketones, ur: NEGATIVE mg/dL
Nitrite: NEGATIVE
Protein, ur: NEGATIVE mg/dL
Specific Gravity, Urine: <1.005 — ABNORMAL LOW (ref 1.005–1.030)
Urobilinogen, UA: 0.2 mg/dL (ref 0.0–1.0)
pH: 6 (ref 5.0–8.0)

## 2014-09-11 LAB — URINE MICROSCOPIC-ADD ON

## 2014-09-11 LAB — POC URINE PREG, ED: Preg Test, Ur: NEGATIVE

## 2014-09-11 MED ORDER — HYDROCODONE-ACETAMINOPHEN 5-325 MG PO TABS: 1.0000 | ORAL_TABLET | ORAL | Status: DC | PRN

## 2014-09-11 MED ORDER — MORPHINE SULFATE 4 MG/ML IJ SOLN
4.0000 mg | Freq: Once | INTRAMUSCULAR | Status: AC
  Administered 2014-09-11: 4 mg via INTRAVENOUS
  Filled 2014-09-11: qty 1

## 2014-09-11 MED ORDER — HYDROCODONE-ACETAMINOPHEN 5-325 MG PO TABS
1.0000 | ORAL_TABLET | Freq: Once | ORAL | Status: AC
Start: 2014-09-11 — End: 2014-09-11
  Administered 2014-09-11: 1 via ORAL
  Filled 2014-09-11: qty 1

## 2014-09-11 MED ORDER — ONDANSETRON HCL 4 MG/2ML IJ SOLN
4.0000 mg | Freq: Once | INTRAMUSCULAR | Status: AC
  Administered 2014-09-11: 4 mg via INTRAVENOUS
  Filled 2014-09-11: qty 2

## 2014-09-11 NOTE — Progress Notes (Signed)
   Subjective:    Patient ID: Stacey Compton, female    DOB: [birth year], 28 y.o.   MRN: [id number]  Flank Pain This is a new problem. Episode onset: Monday. The problem occurs constantly. The problem has been gradually worsening since onset. The pain is present in the thoracic spine. The quality of the pain is described as shooting and aching. Radiates to: Left side of pelvis  The symptoms are aggravated by position and bending. Associated symptoms include pelvic pain. She has tried NSAIDs for the symptoms. The treatment provided mild relief.      Review of Systems  Genitourinary: Positive for flank pain and pelvic pain.   UA negative    Objective:   Physical Exam  Constitutional: She appears well-nourished. No distress.  Cardiovascular: Normal rate, regular rhythm and normal heart sounds.   No murmur heard. Pulmonary/Chest: Effort normal and breath sounds normal. No respiratory distress.  Musculoskeletal: She exhibits no edema.  Lymphadenopathy:    She has no cervical adenopathy.  Neurological: She is alert. She exhibits normal muscle tone.  Psychiatric: Her behavior is normal.  Vitals reviewed.  Flank pain left side to percussion Left abd pain tender to exam   Doubt diverticulitis most likely renal colic    Assessment & Plan:  Renal colic-spoke with Dr Bebe Shaggywickline. Pt agrees to go to ER will need CT or US to determine stone.

## 2014-09-11 NOTE — ED Notes (Signed)
Patient states pain to left back/flank X3days. Patient sent by PCP for kidney stone evaluation. Patient denies pain or blood in urine. A&OX4

## 2014-09-11 NOTE — Discharge Instructions (Signed)

## 2014-09-11 NOTE — ED Notes (Signed)
Patient verbalizes understanding of discharge instructions, prescription medications, home care and follow up care. Patient ambulatory out of department at this time with spouse. 

## 2014-09-11 NOTE — ED Provider Notes (Signed)
CSN: [id number]     Arrival date & time 09/11/14  1635 History   First MD Initiated Contact with Patient 09/11/14 1918     Chief Complaint  Patient presents with  . Flank Pain      Patient is a 28 y.o. female presenting with flank pain. The history is provided by the patient.  Flank Pain This is a new problem. The current episode started more than 2 days ago. The problem occurs daily. The problem has been gradually worsening. Associated symptoms include abdominal pain. Pertinent negatives include no chest pain and no shortness of breath. Exacerbated by: movement. Nothing relieves the symptoms. She has tried rest for the symptoms. The treatment provided no relief.  Patient reports back/left flank pain for up to 3 days She reports pain radiates into left groin No fever/vomiting She reports nausea No vag bleeding/discharge is reported No dysuria is reported   She denies h/o kidney stones  She was seen by PCP and sent for evaluation  Past Medical History  Diagnosis Date  . Endometriosis   . Migraines     with aura  . Hx of varicella   . IBS (irritable bowel syndrome)   . Anxiety   . Depression    Past Surgical History  Procedure Laterality Date  . Laparoscopy      with chromopertubation   Family History  Problem Relation Age of Onset  . Fibromyalgia Mother   . Depression Father   . Anxiety disorder Sister   . Bipolar disorder Maternal Aunt   . Cancer Maternal Grandmother     mouth and lung  . Bipolar disorder Maternal Grandfather   . Heart disease Paternal Grandfather    History  Substance Use Topics  . Smoking status: Never Smoker   . Smokeless tobacco: Never Used  . Alcohol Use: No   OB History    Gravida Para Term Preterm AB TAB SAB Ectopic Multiple Living   1 1 1       1      Review of Systems  Constitutional: Negative for fever.  Respiratory: Negative for shortness of breath.   Cardiovascular: Negative for chest pain.  Gastrointestinal: Positive for  abdominal pain.  Genitourinary: Positive for flank pain. Negative for dysuria, vaginal bleeding and vaginal discharge.  Neurological: Negative for weakness.  All other systems reviewed and are negative.     Allergies  Percocet  Home Medications   Prior to Admission medications   Medication Sig Start Date End Date Taking? Authorizing Provider  ibuprofen (ADVIL,MOTRIN) 200 MG tablet Take 400 mg by mouth every 6 (six) hours as needed for mild pain or moderate pain.   Yes Historical Provider, MD   BP 122/64 mmHg  Pulse 80  Temp(Src) 98 F (36.7 C) (Oral)  Resp 18  Ht 5\' 3"  (1.6 m)  Wt 134 lb (60.782 kg)  BMI 23.74 kg/m2  SpO2 100%  LMP 08/29/2014 Physical Exam CONSTITUTIONAL: Well developed/well nourished HEAD: Normocephalic/atraumatic EYES: EOMI/PERRL ENMT: Mucous membranes moist NECK: supple no meningeal signs SPINE:entire spine nontender CV: S1/S2 noted, no murmurs/rubs/gallops noted LUNGS: Lungs are clear to auscultation bilaterally, no apparent distress ABDOMEN: soft, mild LLQ tenderness, no rebound or guarding WU:JWJXGU:left cva tenderness NEURO: Pt is awake/alert, moves all extremitiesx4 EXTREMITIES: pulses normal, full ROM SKIN: warm, color normal PSYCH: no abnormalities of mood noted  ED Course  Procedures    8:23 PM CT unavailable at this time Initial concern for renal colic Will order renal ultrasound Pt also has LLQ  pain, advised may need pelvic exam, pt defers for now 9:04 PM US imaging and labs unremarkable Pt does feel improved though has some residual pain I advised that I can not fully r/o any ovarian pathology without further examination/US imaging Pt understands this, but would rather go home and f/u with PCP We discussed strict return precautions Labs Review Labs Reviewed  URINALYSIS, ROUTINE W REFLEX MICROSCOPIC - Abnormal; Notable for the following:    Color, Urine STRAW (*)    Specific Gravity, Urine <1.005 (*)    Leukocytes, UA SMALL (*)     All other components within normal limits  BASIC METABOLIC PANEL - Abnormal; Notable for the following:    Potassium 3.6 (*)    All other components within normal limits  CBC WITH DIFFERENTIAL  URINE MICROSCOPIC-ADD ON  POC URINE PREG, ED    Imaging Review [place]  09/11/2014   CLINICAL DATA:  Left flank pain.  EXAM: RENAL/URINARY TRACT ULTRASOUND COMPLETE  COMPARISON:  None.  FINDINGS: Right Kidney:  Length: 10.5 cm. Echogenicity within normal limits. No mass or hydronephrosis visualized.  Left Kidney:  Length: 12.6 cm. Echogenicity within normal limits. No mass or hydronephrosis visualized.  Bladder:  Appears normal for degree of bladder distention. Left ureteral jet is identified. Right ureteral jet is not visualized.  IMPRESSION: Normal appearing kidneys.  Patent left ureter.   Electronically Signed   By: Geanie CooleyJim  Maxwell M.D.   On: 09/11/2014 20:48      MDM   Final diagnoses:  Flank pain  Left lower quadrant pain    Nursing notes including past medical history and social history reviewed and considered in documentation Labs/vital reviewed and considered Previous records reviewed and considered     Joya Gaskinsonald W Kayston Jodoin, MD 09/11/14 2106

## 2014-09-11 NOTE — ED Notes (Signed)
Patient sent here by Dr Lilyan PuntScott Luking for possible kidney stone. Patient c/o left back pain that radiates into left flank and left lower abd. Per patient started in back on Monday and progressively gotten worse. Denies any blood or pain with urination.

## 2014-09-15 ENCOUNTER — Ambulatory Visit: Payer: PRIVATE HEALTH INSURANCE | Admitting: Family Medicine

## 2015-02-04 ENCOUNTER — Ambulatory Visit: Payer: PRIVATE HEALTH INSURANCE | Admitting: Nurse Practitioner

## 2015-02-17 ENCOUNTER — Encounter: Payer: Self-pay | Admitting: Family Medicine

## 2015-02-17 ENCOUNTER — Ambulatory Visit (INDEPENDENT_AMBULATORY_CARE_PROVIDER_SITE_OTHER): Payer: PRIVATE HEALTH INSURANCE | Admitting: Family Medicine

## 2015-02-17 VITALS — BP 130/70 | Ht 63.0 in | Wt 138.0 lb

## 2015-02-17 DIAGNOSIS — R1013 Epigastric pain: Secondary | ICD-10-CM

## 2015-02-17 DIAGNOSIS — K2970 Gastritis, unspecified, without bleeding: Secondary | ICD-10-CM | POA: Diagnosis not present

## 2015-02-17 DIAGNOSIS — K2990 Gastroduodenitis, unspecified, without bleeding: Secondary | ICD-10-CM

## 2015-02-17 MED ORDER — PANTOPRAZOLE SODIUM 40 MG PO TBEC: 40.0000 mg | DELAYED_RELEASE_TABLET | Freq: Every day | ORAL | Status: DC

## 2015-02-17 MED ORDER — SUCRALFATE 1 G PO TABS: ORAL_TABLET | ORAL | Status: DC

## 2015-02-17 NOTE — Progress Notes (Signed)
   Subjective:    Patient ID: Stacey HoffAmber R Cancelliere, female    DOB: October 21, 1986, 29 y.o.   MRN: [id number]  Gastrophageal Reflux She complains of coughing and heartburn. This is a new problem. The current episode started more than 1 month ago. The problem occurs frequently. The problem has been gradually worsening. The heartburn is located in the substernum and abdomen. The heartburn is of moderate intensity. Exacerbated by: eating. There are no known risk factors. Treatments tried: nexium  The treatment provided no relief.   Patient states that she has been under a lot more stress due to her job lately also. Present for about 1-2 months.   Worsening despite ref to hearburn  Mo has reflux tendencies, not fa but g fa on dad's side.  Coffee on cup a day  Off and on , night symtoms and worse Can occur any time in day or eve  Progressive pain. Epigastric in nature. Fairly severe times. Now worse with pretty much any meal.  No significant anti-inflammatory use. Nonsmoker.  Review of Systems  Respiratory: Positive for cough.   Gastrointestinal: Positive for heartburn.       Objective:   Physical Exam  Alert vitals stable mild malaise HEENT normal. Lungs clear. Heart regular in rhythm. Discrete epigastric tenderness. No CVA tenderness no right upper quadrant tenderness      Assessment & Plan:  Impression gastritis with severity of pain and symptoms further workup warranted. Definitely recommend H. pylori test. Discussed at length. 25 minutes spent most in discussion plan initiate protonic. Initiate Carafate before meals and at bedtime. Further recommendations based on results follow-up as scheduled [facility]

## 2015-02-19 LAB — H PYLORI, IGM, IGG, IGA AB
H Pylori IgG: <0.9 U/mL (ref 0.0–0.8)
H. pylori, IgA Abs: <9 units (ref 0.0–8.9)
H. pylori, IgM Abs: 12.5 units — ABNORMAL HIGH (ref 0.0–8.9)

## 2015-02-20 ENCOUNTER — Other Ambulatory Visit: Payer: Self-pay | Admitting: *Deleted

## 2015-02-20 MED ORDER — CLARITHROMYCIN 500 MG PO TABS: 500.0000 mg | ORAL_TABLET | Freq: Two times a day (BID) | ORAL | Status: DC

## 2015-02-20 MED ORDER — AMOXICILLIN 500 MG PO TABS: ORAL_TABLET | ORAL | Status: DC

## 2015-03-10 ENCOUNTER — Ambulatory Visit: Payer: PRIVATE HEALTH INSURANCE | Admitting: Family Medicine

## 2015-03-17 ENCOUNTER — Ambulatory Visit (INDEPENDENT_AMBULATORY_CARE_PROVIDER_SITE_OTHER): Payer: PRIVATE HEALTH INSURANCE | Admitting: Family Medicine

## 2015-03-17 ENCOUNTER — Encounter: Payer: Self-pay | Admitting: Family Medicine

## 2015-03-17 VITALS — BP 110/70 | Temp 98.7°F | Ht 63.0 in | Wt 138.0 lb

## 2015-03-17 DIAGNOSIS — K2990 Gastroduodenitis, unspecified, without bleeding: Secondary | ICD-10-CM | POA: Diagnosis not present

## 2015-03-17 DIAGNOSIS — R1013 Epigastric pain: Secondary | ICD-10-CM | POA: Diagnosis not present

## 2015-03-17 DIAGNOSIS — K2970 Gastritis, unspecified, without bleeding: Secondary | ICD-10-CM

## 2015-03-17 NOTE — Patient Instructions (Signed)

## 2015-03-17 NOTE — Progress Notes (Signed)
   Subjective:    Patient ID: Sissy HoffAmber R Campo, female    DOB: 11-15-1985, 29 y.o.   MRN: [id number]  Abdominal Pain Pertinent negatives include no constipation, diarrhea or nausea.  Follow up on abd pain. Test positive for h pylori. Doing better. Taking protonix and carafate.  still on antibiotics amoxil and biaxin.    Review of Systems  Cardiovascular: Negative for chest pain.  Gastrointestinal: Negative for nausea, abdominal pain, diarrhea, constipation, blood in stool and abdominal distention.   Denies high fever chills rectal bleeding    Objective:   Physical Exam  Lungs clear heart regular abdomen soft no guarding rebound or tenderness  Is going through some transitions in undergoing some stress as well, patient looking at some new jobs    Assessment & Plan:  Gastritis improving continue proton pump inhibitor for at least 3-4 months then taper off of it if possible. Finish out antibiotics. If reoccurring trouble let us know. No need for further testing currently. Patient understands follow-up again in 3-4 months if not able to successfully wean off.

## 2015-07-01 ENCOUNTER — Telehealth: Payer: Self-pay | Admitting: Family Medicine

## 2015-07-01 NOTE — Telephone Encounter (Signed)
Patient called @ 3:49 while system was down  Need new Rx for her Maxalt.  States she's used in the past for her migraines and it works well.  Having a migraine today and does not have any on hand  [facility]   Please call when done  Wk# [phone], Cell# [phone]

## 2015-07-02 MED ORDER — RIZATRIPTAN BENZOATE 10 MG PO TABS: ORAL_TABLET | ORAL | Status: DC

## 2015-07-02 NOTE — Telephone Encounter (Signed)
Maxalt 10 MG 10 tablets and 2 refills added to epic per Dr.Scott

## 2015-07-02 NOTE — Telephone Encounter (Signed)
A prescription for Maxalt 10 mg disintegrating tablet was sent in. I called this in to her pharmacy. I also left a message on the patient's machine regarding this. Please make sure this medication is entered in her Epic med list. Maxalt 10 mg disintegrating tablet-one at the first sign a migraine may repeat 2 hours later. 10 tablets. 2 refills.

## 2015-07-02 NOTE — Telephone Encounter (Signed)
Received prior auth request from pharmacy for this medication  This is a covered medicine, the problem is with the quantity (medication comes in a blister pack of 9 tablets)  Patient's plan allows 54 tablets (3 packs of 18 tablets) for 90 days or 18 tablets for 30 days  Can we change Rx quantity to fit the allowed amount per her insurance or do I need to try to get prior auth?  Please advise

## 2015-07-02 NOTE — Telephone Encounter (Signed)
Oh boy, plz send in script for 18 tablets with 4 refills per insurance co quirky rules

## 2015-07-06 ENCOUNTER — Other Ambulatory Visit: Payer: Self-pay | Admitting: Obstetrics and Gynecology

## 2015-07-08 LAB — CYTOLOGY - PAP

## 2015-08-20 ENCOUNTER — Encounter: Payer: Self-pay | Admitting: Family Medicine

## 2015-08-20 ENCOUNTER — Ambulatory Visit (INDEPENDENT_AMBULATORY_CARE_PROVIDER_SITE_OTHER): Payer: BC Managed Care – PPO | Admitting: Family Medicine

## 2015-08-20 VITALS — BP 112/70 | Temp 99.1°F | Ht 63.0 in | Wt 140.0 lb

## 2015-08-20 DIAGNOSIS — G43109 Migraine with aura, not intractable, without status migrainosus: Secondary | ICD-10-CM | POA: Diagnosis not present

## 2015-08-20 MED ORDER — RIZATRIPTAN BENZOATE 10 MG PO TABS: ORAL_TABLET | ORAL | Status: DC

## 2015-08-20 MED ORDER — ONDANSETRON HCL 8 MG PO TABS: 8.0000 mg | ORAL_TABLET | Freq: Three times a day (TID) | ORAL | Status: DC | PRN

## 2015-08-20 MED ORDER — TOPIRAMATE 50 MG PO TABS: 50.0000 mg | ORAL_TABLET | Freq: Two times a day (BID) | ORAL | Status: DC

## 2015-08-20 MED ORDER — PROMETHAZINE HCL 25 MG PO TABS
25.0000 mg | ORAL_TABLET | Freq: Three times a day (TID) | ORAL | Status: DC | PRN
Start: 2015-08-20 — End: 2017-05-12

## 2015-08-20 NOTE — Progress Notes (Signed)
   Subjective:    Patient ID: Stacey Compton, female    DOB: 1986-05-06, 29 y.o.   MRN: [id number]  Migraine  This is a recurrent problem. The current episode started more than 1 month ago. The problem occurs intermittently. The problem has been gradually worsening. The pain is located in the temporal region. The quality of the pain is described as pulsating and aching. The pain is at a severity of 7/10. The pain is moderate. Associated symptoms include nausea, numbness, photophobia and vomiting. Pertinent negatives include no abdominal pain, insomnia, loss of balance or neck pain. The symptoms are aggravated by activity. She has tried triptans for the symptoms. The treatment provided moderate relief.  migraines are worse in the past 2 months. Taking maxalt with mild relief. Having 2 -4 migraines a week.  has saw neurologist in the past about 2 years ago.  Dr. Gerilyn Pilgrimoonquah. Pt was not happy with him.   She relates that the headache started off with moderate headache left side she will get aura before she will also get some tingling on the left side she is had MRI in the past and has seen neurology in the past  Review of Systems  Eyes: Positive for photophobia.  Gastrointestinal: Positive for nausea and vomiting. Negative for abdominal pain.  Musculoskeletal: Negative for neck pain.  Neurological: Positive for numbness. Negative for loss of balance.  Psychiatric/Behavioral: The patient does not have insomnia.        Objective:   Physical Exam Optic discharge pupils responsive Neck no masses lungs clear heart regular neurologic grossly normal   I don't find any evidence that this patient needs MRI currently.    Assessment & Plan:  Migraine with aura-Maxalt as directed Phenergan if necessary nighttime for nausea Zofran during the day if necessary also start Topamax 50 mg daily for 7 days then 1 twice daily give us feedback within a few weeks how she is doing. Headache calendar given. Follow-up  within 3 months. If headaches become worse referral to neurology.

## 2015-08-24 ENCOUNTER — Ambulatory Visit (INDEPENDENT_AMBULATORY_CARE_PROVIDER_SITE_OTHER): Payer: BC Managed Care – PPO | Admitting: Family Medicine

## 2015-08-24 ENCOUNTER — Encounter: Payer: Self-pay | Admitting: Family Medicine

## 2015-08-24 ENCOUNTER — Telehealth: Payer: Self-pay | Admitting: Family Medicine

## 2015-08-24 VITALS — BP 122/70 | Temp 98.8°F | Ht 63.0 in | Wt 138.0 lb

## 2015-08-24 DIAGNOSIS — J019 Acute sinusitis, unspecified: Secondary | ICD-10-CM

## 2015-08-24 DIAGNOSIS — J683 Other acute and subacute respiratory conditions due to chemicals, gases, fumes and vapors: Secondary | ICD-10-CM

## 2015-08-24 DIAGNOSIS — J4520 Mild intermittent asthma, uncomplicated: Secondary | ICD-10-CM | POA: Diagnosis not present

## 2015-08-24 MED ORDER — AZITHROMYCIN 250 MG PO TABS
ORAL_TABLET | ORAL | Status: DC
Start: 2015-08-24 — End: 2015-09-01

## 2015-08-24 MED ORDER — BENZONATATE 100 MG PO CAPS: 100.0000 mg | ORAL_CAPSULE | Freq: Three times a day (TID) | ORAL | Status: DC | PRN

## 2015-08-24 MED ORDER — ALBUTEROL SULFATE HFA 108 (90 BASE) MCG/ACT IN AERS: 2.0000 | INHALATION_SPRAY | Freq: Four times a day (QID) | RESPIRATORY_TRACT | Status: DC | PRN

## 2015-08-24 NOTE — Progress Notes (Signed)
   Subjective:    Patient ID: Stacey Compton, female    DOB: 05-22-1986, 29 y.o.   MRN: [id number]  Cough This is a new problem. Episode onset: 4 days. Associated symptoms include wheezing. Treatments tried: OTC meds, mucinex, nyquil, delsym.    Slight tickle started four d ago  Now cough is bad,   No h oof inhaler use   Cong and drainage notan isue  No head cold symp  Used delsym n prn   Review of Systems  Respiratory: Positive for cough and wheezing.    no vomiting no diarrhea no rash     Objective:   Physical Exam Alert mild malaise. Vital stable HET moderate nasal congestion pharynx normal neck supple lungs rare mild wheezes heart regular in rhythm       Assessment & Plan:  Impression rhinosinusitis/bronchitis with reactive airways plan Ventolin 2 sprays 4 times a day proper use discussed antibiotics prescribed symptoms care discussed WSL

## 2015-08-24 NOTE — Telephone Encounter (Signed)
Rx prior auth for pt's rizatriptan (MAXALT) 10 MG tablet, valid 07/31/15-08/20/16 through [employer] Case ID# [id number]

## 2015-08-27 ENCOUNTER — Encounter: Payer: Self-pay | Admitting: Family Medicine

## 2015-08-27 ENCOUNTER — Telehealth: Payer: Self-pay | Admitting: Family Medicine

## 2015-08-27 MED ORDER — PREDNISONE 20 MG PO TABS
ORAL_TABLET | ORAL | Status: DC
Start: 2015-08-27 — End: 2015-09-07

## 2015-08-27 NOTE — Telephone Encounter (Signed)
Excuse thru tomorrow, add adult pred taper, will help the wheezing significantly

## 2015-08-27 NOTE — Telephone Encounter (Signed)
Called patient and informed per Dr.Steve Luking-Excuse thru tomorrow. Also we will send in a prednisone taper. Patient verbalized understanding.

## 2015-08-27 NOTE — Telephone Encounter (Signed)
Dr Brett CanalesSteve saw this pt on 10/17 For acute Sinusitis, she states she has been taking her antibiotic, inhaler  An tessalon pearls  But she is still wheezing and gasping for air Wants to know if she needs to be rechecked or just give it a few more days to Let the meds work?    She would also like her work note extended to at least today as well possibly  Longer since she is still wheezing if you think she needs to take the time?  Please advise

## 2015-09-01 ENCOUNTER — Encounter: Payer: Self-pay | Admitting: Family Medicine

## 2015-09-01 ENCOUNTER — Ambulatory Visit (INDEPENDENT_AMBULATORY_CARE_PROVIDER_SITE_OTHER): Payer: BC Managed Care – PPO | Admitting: Family Medicine

## 2015-09-01 ENCOUNTER — Ambulatory Visit (HOSPITAL_COMMUNITY)
Admission: RE | Admit: 2015-09-01 | Discharge: 2015-09-01 | Disposition: A | Discharge status: Home / Self Care | Payer: BC Managed Care – PPO | Source: Ambulatory Visit | Attending: Family Medicine | Admitting: Family Medicine

## 2015-09-01 ENCOUNTER — Other Ambulatory Visit (HOSPITAL_COMMUNITY)
Admission: RE | Admit: 2015-09-01 | Discharge: 2015-09-01 | Disposition: A | Discharge status: Home / Self Care | Payer: BC Managed Care – PPO | Source: Ambulatory Visit | Attending: Family Medicine | Admitting: Family Medicine

## 2015-09-01 VITALS — BP 120/74 | Temp 99.1°F | Ht 63.0 in | Wt 138.4 lb

## 2015-09-01 DIAGNOSIS — J218 Acute bronchiolitis due to other specified organisms: Secondary | ICD-10-CM | POA: Diagnosis not present

## 2015-09-01 DIAGNOSIS — J019 Acute sinusitis, unspecified: Secondary | ICD-10-CM

## 2015-09-01 DIAGNOSIS — M609 Myositis, unspecified: Secondary | ICD-10-CM

## 2015-09-01 DIAGNOSIS — M791 Myalgia: Secondary | ICD-10-CM

## 2015-09-01 DIAGNOSIS — J209 Acute bronchitis, unspecified: Secondary | ICD-10-CM | POA: Insufficient documentation

## 2015-09-01 DIAGNOSIS — IMO0001 Reserved for inherently not codable concepts without codable children: Secondary | ICD-10-CM

## 2015-09-01 LAB — BASIC METABOLIC PANEL
Anion gap: 9 (ref 5–15)
BUN: 6 mg/dL (ref 6–20)
CO2: 21 mmol/L — ABNORMAL LOW (ref 22–32)
Calcium: 9.2 mg/dL (ref 8.9–10.3)
Chloride: 108 mmol/L (ref 101–111)
Creatinine, Ser: 0.85 mg/dL (ref 0.44–1.00)
GFR calc Af Amer: >60 mL/min (ref >60)
GFR calc non Af Amer: >60 mL/min (ref >60)
Glucose, Bld: 179 mg/dL — ABNORMAL HIGH (ref 65–99)
Potassium: 3.8 mmol/L (ref 3.5–5.1)
Sodium: 138 mmol/L (ref 135–145)

## 2015-09-01 LAB — CBC WITH DIFFERENTIAL/PLATELET
Basophils Absolute: 0 10*3/uL (ref 0.0–0.1)
Basophils Relative: 0 %
Eosinophils Absolute: 0 10*3/uL (ref 0.0–0.7)
Eosinophils Relative: 0 %
HCT: 41 % (ref 36.0–46.0)
Hemoglobin: 14.2 g/dL (ref 12.0–15.0)
Lymphocytes Relative: 4 %
Lymphs Abs: 0.4 10*3/uL — ABNORMAL LOW (ref 0.7–4.0)
MCH: 32.1 pg (ref 26.0–34.0)
MCHC: 34.6 g/dL (ref 30.0–36.0)
MCV: 92.6 fL (ref 78.0–100.0)
Monocytes Absolute: 0.1 10*3/uL (ref 0.1–1.0)
Monocytes Relative: 1 %
Neutro Abs: 10.6 10*3/uL — ABNORMAL HIGH (ref 1.7–7.7)
Neutrophils Relative %: 95 %
Platelets: 253 10*3/uL (ref 150–400)
RBC: 4.43 MIL/uL (ref 3.87–5.11)
RDW: 12.9 % (ref 11.5–15.5)
WBC: 11.1 10*3/uL — ABNORMAL HIGH (ref 4.0–10.5)

## 2015-09-01 MED ORDER — HYDROCODONE-HOMATROPINE 5-1.5 MG/5ML PO SYRP: 5.0000 mL | ORAL_SOLUTION | Freq: Four times a day (QID) | ORAL | Status: DC | PRN

## 2015-09-01 MED ORDER — AMOXICILLIN-POT CLAVULANATE 875-125 MG PO TABS: 1.0000 | ORAL_TABLET | Freq: Two times a day (BID) | ORAL | Status: DC

## 2015-09-01 NOTE — Progress Notes (Signed)
   Subjective:    Patient ID: Stacey Compton, female    DOB: 09-Oct-1986, 29 y.o.   MRN: [id number]  Cough This is a new problem. The current episode started 1 to 4 weeks ago. The problem has been gradually worsening. The problem occurs every few minutes. The cough is non-productive. Associated symptoms include myalgias and wheezing.   Patient in today for a recheck for cough.Patient states no other concerns this visit. Patient is been treated with antibiotics she is having progressive coughing shortness of breath she was started on prednisone last week she does not feel much better she relates headache from coughing she states at times she gets short of breath when she is coughing.  Review of Systems  Respiratory: Positive for cough and wheezing.   Musculoskeletal: Positive for myalgias.       Objective:   Physical Exam  Constitutional: She appears well-developed.  HENT:  Head: Normocephalic.  Nose: Nose normal.  Mouth/Throat: Oropharynx is clear and moist. No oropharyngeal exudate.  Neck: Neck supple.  Cardiovascular: Normal rate and normal heart sounds.   No murmur heard. Pulmonary/Chest: Effort normal and breath sounds normal. She has no wheezes.  Lymphadenopathy:    She has no cervical adenopathy.  Skin: Skin is warm and dry.  Nursing note and vitals reviewed.         Assessment & Plan:  Acute bronchitis Possible secondary bacterial component Because of prolonged nature plus also frequent coughing stat lab work and stat chest x-ray Lab work does not show significant elevation of WBC. Glucose moderately elevated which goes along with prednisone use Chest x-ray does not show any pneumonia Augmentin 875 twice a day with food 10 days Use albuterol every 4 hours as directed If progressively worse follow-up or go to ER Prescription cough medication for home use only caution drowsiness

## 2015-09-03 ENCOUNTER — Telehealth: Payer: Self-pay | Admitting: Family Medicine

## 2015-09-03 MED ORDER — BENZONATATE 100 MG PO CAPS: 100.0000 mg | ORAL_CAPSULE | Freq: Three times a day (TID) | ORAL | Status: DC | PRN

## 2015-09-03 NOTE — Telephone Encounter (Signed)
Pt states she feels she is also allergic to Hycodan  She stated she started having vomiting after 2 doses And red colored all over. She is taking the rest of her  meds but can not take the cough meds.   Please note this on her chart   Wants to know if there is anything else you would suggest   [facility]

## 2015-09-03 NOTE — Telephone Encounter (Signed)
#  1 please note in Epic hydrocodone rash vomiting under allergies. #2 discontinue hydrocodone 3 patient already has Tessalon if needs refill please give #4 Robitussin-DM or Delsym may be used to help with cough hopefully patient should turn the corner over the next several days. If not improving by next week to let us know.

## 2015-09-03 NOTE — Telephone Encounter (Signed)
Notified patient #2 discontinue hydrocodone 3 patient already has Tessalon if needs refill please give #4 Robitussin-DM or Delsym may be used to help with cough hopefully patient should turn the corner over the next several days. If not improving by next week to let us know. Patient verbalized understanding. Refill sent to pharmacy.

## 2015-09-07 ENCOUNTER — Encounter: Payer: Self-pay | Admitting: Family Medicine

## 2015-09-07 ENCOUNTER — Ambulatory Visit (INDEPENDENT_AMBULATORY_CARE_PROVIDER_SITE_OTHER): Payer: BC Managed Care – PPO | Admitting: Family Medicine

## 2015-09-07 ENCOUNTER — Telehealth: Payer: Self-pay | Admitting: Family Medicine

## 2015-09-07 VITALS — Temp 97.6°F | Ht 63.0 in | Wt 139.4 lb

## 2015-09-07 DIAGNOSIS — J209 Acute bronchitis, unspecified: Secondary | ICD-10-CM | POA: Diagnosis not present

## 2015-09-07 MED ORDER — LORAZEPAM 0.5 MG PO TABS: 0.5000 mg | ORAL_TABLET | Freq: Every day | ORAL | Status: DC

## 2015-09-07 MED ORDER — CLARITHROMYCIN 500 MG PO TABS: 500.0000 mg | ORAL_TABLET | Freq: Two times a day (BID) | ORAL | Status: DC

## 2015-09-07 NOTE — Telephone Encounter (Signed)
Pt called stating that she still isnt better and has not returned to work. Pt is still taking the meds she was prescribed last tues. Please advise.

## 2015-09-07 NOTE — Telephone Encounter (Signed)
No fever since early Sunday am, non productive cough, sob and wheezing worse at night. Using albuterol inhaler. Pt states she is about the same no worse no better. Taking augmentin and tessalon tid. Only sleeping about 45 min - one hour at night. Having hard time sleeping due to wheezing and cough. Allergic to hycodan. Chest tight last night using delsym cough also.

## 2015-09-07 NOTE — Telephone Encounter (Signed)
Nurse's-please find out from the patient. Any fevers? Is she experiencing shortness of breath or only shortness when she is coughing? Any productive? Does patient feels she is getting worse? Certainly we can see her if she would like to be rechecked. Last week when I saw her she had severe bronchitis which unfortunately can take time to get better. Please find out the answers to these questions and talk with me thank you

## 2015-09-07 NOTE — Telephone Encounter (Signed)
Do you want to recheck this patient?

## 2015-09-07 NOTE — Progress Notes (Signed)
   Subjective:    Patient ID: Stacey Compton, female    DOB: 05/29/1986, 29 y.o.   MRN: [id number]  Cough This is a new problem. The current episode started 1 to 4 weeks ago. Associated symptoms include rhinorrhea and wheezing. Pertinent negatives include no chest pain, ear pain, fever or shortness of breath. Treatments tried: augmentin, tessalon, inhaler.   Patient has had several weeks of cough and congestion denies high fever chills denies sweats nausea vomiting diarrhea. Had x-rays lab work in the past has taken 2 rounds of antibiotics relates has frequent coughing   Review of Systems  Constitutional: Negative for fever and activity change.  HENT: Positive for congestion and rhinorrhea. Negative for ear pain.   Eyes: Negative for discharge.  Respiratory: Positive for cough and wheezing. Negative for shortness of breath.   Cardiovascular: Negative for chest pain.       Objective:   Physical Exam  Constitutional: She appears well-developed.  HENT:  Head: Normocephalic.  Nose: Nose normal.  Mouth/Throat: Oropharynx is clear and moist. No oropharyngeal exudate.  Neck: Neck supple.  Cardiovascular: Normal rate and normal heart sounds.   No murmur heard. Pulmonary/Chest: Effort normal and breath sounds normal. She has no wheezes.  Lymphadenopathy:    She has no cervical adenopathy.  Skin: Skin is warm and dry.  Nursing note and vitals reviewed.         Assessment & Plan:  Severe bronchitis change and antibiotics recommended. I do not feel repeat x-ray or lab work indicated. Will use mouth nerve pill at nighttime to help with sleep. Patient follow-up if ongoing troubles.  Referral to pulmonology if not improving over the next 1-2 weeks

## 2015-09-07 NOTE — Telephone Encounter (Signed)
Ov per dr Lorin Picketscott today for recheck. Pt coming in at 2:30 to be worked in.

## 2015-12-21 ENCOUNTER — Encounter: Payer: Self-pay | Admitting: Family Medicine

## 2015-12-21 ENCOUNTER — Ambulatory Visit (INDEPENDENT_AMBULATORY_CARE_PROVIDER_SITE_OTHER): Payer: BC Managed Care – PPO | Admitting: Family Medicine

## 2015-12-21 VITALS — BP 108/70 | Ht 63.0 in | Wt 144.1 lb

## 2015-12-21 DIAGNOSIS — Z139 Encounter for screening, unspecified: Secondary | ICD-10-CM

## 2015-12-21 DIAGNOSIS — R5383 Other fatigue: Secondary | ICD-10-CM | POA: Diagnosis not present

## 2015-12-21 DIAGNOSIS — G43109 Migraine with aura, not intractable, without status migrainosus: Secondary | ICD-10-CM | POA: Diagnosis not present

## 2015-12-21 MED ORDER — PANTOPRAZOLE SODIUM 40 MG PO TBEC: 40.0000 mg | DELAYED_RELEASE_TABLET | Freq: Every day | ORAL | Status: DC

## 2015-12-21 NOTE — Progress Notes (Signed)
   Subjective:    Patient ID: Stacey Compton, female    DOB: January 02, 1986, 30 y.o.   MRN: [id number]  Migraine  This is a recurrent problem. The current episode started more than 1 year ago. Treatments tried: Maxalt    Patient states no other concerns this visit. States overall doing well with medications but she stopped taking Topamax few months ago but in her headaches of been doing well Patient states she gets good sleep she tries to eat healthy and keeps her stress levels manageable Review of Systems    intermittent headaches not severe no nausea vomiting or diarrhea. Maybe one or 2, Objective:   Physical Exam    Lungs clear hearts regular neurologic grossly normal blood pressure good    Assessment & Plan:  Migraines good control continue episodic medicine stop Topamax follow-up in one year follow-up sooner if any problems. Fatigue-will be doing some basic lab testing.

## 2016-01-19 ENCOUNTER — Encounter: Payer: Self-pay | Admitting: Family Medicine

## 2016-01-19 ENCOUNTER — Ambulatory Visit (INDEPENDENT_AMBULATORY_CARE_PROVIDER_SITE_OTHER): Payer: BC Managed Care – PPO | Admitting: Family Medicine

## 2016-01-19 VITALS — BP 110/68 | Temp 98.6°F | Ht 63.0 in | Wt 142.0 lb

## 2016-01-19 DIAGNOSIS — R1013 Epigastric pain: Secondary | ICD-10-CM | POA: Diagnosis not present

## 2016-01-19 MED ORDER — DEXLANSOPRAZOLE 60 MG PO CPDR: 60.0000 mg | DELAYED_RELEASE_CAPSULE | Freq: Every day | ORAL | Status: DC

## 2016-01-19 MED ORDER — SUCRALFATE 1 GM/10ML PO SUSP: 1.0000 g | Freq: Three times a day (TID) | ORAL | Status: DC

## 2016-01-19 NOTE — Progress Notes (Signed)
   Subjective:    Patient ID: Stacey HoffAmber R Compton, female    DOB: 30-May-1986, 30 y.o.   MRN: [id number]  Gastroesophageal Reflux She complains of abdominal pain and nausea. She reports no coughing. back pain,not able to sleep due to symptoms. This is a recurrent problem. The current episode started more than 1 year ago. Pertinent negatives include no fatigue. She has tried head elevation (protonix, zantac, changes in diet, ) for the symptoms.   patient relates epigastric pain discomfort. Makes it difficult for her to sleep over the past couple days. She describes of aching burning sensation in the mid epigastrium that radiates to her back does not radiate to her flanks. Denies any dysuria denies vomiting diarrhea denies weight loss denies fever chills sweats. PMH benign patient was seen little over year ago had H pylori test which was negative patient did well on protonic's until recently.  Patient does not have any strong family history of any type of intestinal disease other than what is sounds like possible irritable bowel.  Review of Systems  Constitutional: Negative for fever and fatigue.  Respiratory: Negative for cough and shortness of breath.   Gastrointestinal: Positive for nausea and abdominal pain. Negative for diarrhea, blood in stool and anal bleeding.  Musculoskeletal: Positive for back pain.       Objective:   Physical Exam  Constitutional: She appears well-nourished. No distress.  Cardiovascular: Normal rate, regular rhythm and normal heart sounds.   No murmur heard. Pulmonary/Chest: Effort normal and breath sounds normal. No respiratory distress.  Abdominal: Soft. There is tenderness (epigastric).  Musculoskeletal: She exhibits no edema.  Lymphadenopathy:    She has no cervical adenopathy.  Neurological: She is alert. She exhibits normal muscle tone.  Psychiatric: Her behavior is normal.  Vitals reviewed.   Patient denies high fevers chills denies night sweats. Denies  change and appetite denies weight loss.      Assessment & Plan:  Significant epigastric pain radiates to her back wakes her up at night progressive over the past month worse over the past few days. Change PPI. Add Carafate. Patient already doing significant dietary modification. Recommend patient to be rechecked if worse. We will be doing ultrasound to look at gallbladder liver and pancreas. In addition to this will be doing lab work await the results. Patient will probably need referral to gastroenterology as well await this until findings of the tests come back.

## 2016-01-20 ENCOUNTER — Other Ambulatory Visit (HOSPITAL_COMMUNITY): Payer: BC Managed Care – PPO

## 2016-01-20 ENCOUNTER — Telehealth: Payer: Self-pay | Admitting: Family Medicine

## 2016-01-20 LAB — BASIC METABOLIC PANEL
BUN/Creatinine Ratio: 13 (ref 8–20)
BUN: 9 mg/dL (ref 6–20)
CO2: 23 mmol/L (ref 18–29)
Calcium: 9.3 mg/dL (ref 8.7–10.2)
Chloride: 100 mmol/L (ref 96–106)
Creatinine, Ser: 0.71 mg/dL (ref 0.57–1.00)
GFR calc Af Amer: 131 mL/min/{1.73_m2} (ref >59)
GFR calc non Af Amer: 114 mL/min/{1.73_m2} (ref >59)
Glucose: 116 mg/dL — ABNORMAL HIGH (ref 65–99)
Potassium: 4.1 mmol/L (ref 3.5–5.2)
Sodium: 139 mmol/L (ref 134–144)

## 2016-01-20 LAB — LIPASE: Lipase: 37 U/L (ref 0–59)

## 2016-01-20 LAB — CBC WITH DIFFERENTIAL/PLATELET
Basophils Absolute: 0 10*3/uL (ref 0.0–0.2)
Basos: 0 %
EOS (ABSOLUTE): 0.1 10*3/uL (ref 0.0–0.4)
Eos: 2 %
Hematocrit: 41.4 % (ref 34.0–46.6)
Hemoglobin: 14.1 g/dL (ref 11.1–15.9)
Immature Grans (Abs): 0 10*3/uL (ref 0.0–0.1)
Immature Granulocytes: 0 %
Lymphocytes Absolute: 1.9 10*3/uL (ref 0.7–3.1)
Lymphs: 23 %
MCH: 31.3 pg (ref 26.6–33.0)
MCHC: 34.1 g/dL (ref 31.5–35.7)
MCV: 92 fL (ref 79–97)
Monocytes Absolute: 0.2 10*3/uL (ref 0.1–0.9)
Monocytes: 3 %
Neutrophils Absolute: 5.8 10*3/uL (ref 1.4–7.0)
Neutrophils: 72 %
Platelets: 291 10*3/uL (ref 150–379)
RBC: 4.5 x10E6/uL (ref 3.77–5.28)
RDW: 13.2 % (ref 12.3–15.4)
WBC: 8.1 10*3/uL (ref 3.4–10.8)

## 2016-01-20 LAB — HEPATIC FUNCTION PANEL
ALT: 21 IU/L (ref 0–32)
AST: 21 IU/L (ref 0–40)
Albumin: 4.5 g/dL (ref 3.5–5.5)
Alkaline Phosphatase: 45 IU/L (ref 39–117)
Bilirubin Total: 0.5 mg/dL (ref 0.0–1.2)
Bilirubin, Direct: 0.12 mg/dL (ref 0.00–0.40)
Total Protein: 7.8 g/dL (ref 6.0–8.5)

## 2016-01-20 LAB — SEDIMENTATION RATE: Sed Rate: 4 mm/hr (ref 0–32)

## 2016-01-20 NOTE — Telephone Encounter (Signed)
Pt is wanting to wait on the ultrasound due to her insurance not covering any of the cost. Pt states that she is wanting to know what Dr. Lorin PicketScott thinks about her waiting on it. Please advise.

## 2016-01-20 NOTE — Telephone Encounter (Signed)
Discussed with patient. Patient advised Dr Lorin PicketScott would recommend #1 cancel ultrasound #2 patient will need the new regimen for at least 2 weeks and if she does not see significant improvement within 2 weeks she is to let us know and we would recommend at that point setting her up with gastroenterology or further. Patient verbalized understanding and stated she has canceled the ultrasound and is doing much better on current meds.

## 2016-01-20 NOTE — Telephone Encounter (Signed)
I would recommend #1 cancel ultrasound #2 patient will need the new regimen for at least 2 weeks and if she does not see significant improvement within 2 weeks she is to let us know and we would recommend at that point setting her up with gastroenterology or further

## 2016-01-21 ENCOUNTER — Ambulatory Visit (HOSPITAL_COMMUNITY): Payer: BC Managed Care – PPO

## 2016-04-01 ENCOUNTER — Encounter: Payer: Self-pay | Admitting: Nurse Practitioner

## 2016-04-01 ENCOUNTER — Ambulatory Visit (INDEPENDENT_AMBULATORY_CARE_PROVIDER_SITE_OTHER): Payer: BC Managed Care – PPO | Admitting: Nurse Practitioner

## 2016-04-01 VITALS — BP 118/76 | Ht 63.0 in | Wt 138.4 lb

## 2016-04-01 DIAGNOSIS — F32A Depression, unspecified: Secondary | ICD-10-CM

## 2016-04-01 DIAGNOSIS — K219 Gastro-esophageal reflux disease without esophagitis: Secondary | ICD-10-CM | POA: Diagnosis not present

## 2016-04-01 DIAGNOSIS — F418 Other specified anxiety disorders: Secondary | ICD-10-CM

## 2016-04-01 DIAGNOSIS — F419 Anxiety disorder, unspecified: Secondary | ICD-10-CM

## 2016-04-01 DIAGNOSIS — F329 Major depressive disorder, single episode, unspecified: Secondary | ICD-10-CM

## 2016-04-01 MED ORDER — CLONAZEPAM 0.5 MG PO TABS: ORAL_TABLET | ORAL | Status: DC

## 2016-04-01 MED ORDER — CITALOPRAM HYDROBROMIDE 20 MG PO TABS: 20.0000 mg | ORAL_TABLET | Freq: Every day | ORAL | Status: DC

## 2016-04-01 NOTE — Progress Notes (Signed)
Subjective:  Presents for recheck on her acid reflux. Continues to have some breakthrough symptoms about every other week lasting 2-3 days. Worse at nighttime. Mainly in the epigastric area radiating into the back, occasional sharp pain. Nausea with rare vomiting. Has had major dietary changes, increase fruit and vegetable intake. May be worse with fatty foods but otherwise no particular foods. No caffeine use. Nonsmoker. Rare alcohol use. Is seeing a counselor for anxiety and depression. Has recommended medication for this. Has noticed increased stress will increase her symptoms. Having alternating cycles of diarrhea and constipation as well associated with stress. Hypersomnia. Emotional lability. Occasional mild panic attack. Has a strong family history of anxiety and depression. States she is needed medicine for a while but has been resistant to starting it.  Objective:   BP 118/76 mmHg  Ht 5\' 3"  (1.6 m)  Wt 138 lb 6.4 oz (62.778 kg)  BMI 24.52 kg/m2 NAD. Alert, oriented. Lungs clear. Heart regular rate rhythm. Abdomen soft nondistended with active bowel sounds 4; distinct epigastric area tenderness, minimal right upper quadrant tenderness. Negative Murphy sign. No rebound or guarding. No obvious masses.  Assessment:  Problem List Items Addressed This Visit      Digestive   Gastroesophageal reflux disease without esophagitis - Primary     Other   Anxiety and depression     Plan:  Meds ordered this encounter  Medications  . citalopram (CELEXA) 20 MG tablet    Sig: Take 1 tablet (20 mg total) by mouth daily.    Dispense:  30 tablet    Refill:  1    Order Specific Question:  Supervising Provider    Answer:  Merlyn AlbertLUKING, WILLIAM S [[id number]]  . clonazePAM (KLONOPIN) 0.5 MG tablet    Sig: 1/2-1 po BID prn anxiety    Dispense:  20 tablet    Refill:  0    Order Specific Question:  Supervising Provider    Answer:  Merlyn AlbertLUKING, WILLIAM S [[id number]]   Discussed options. The plan is to approach this from  an mental health perspective in reducing stress. Also explained the probability of irritable bowel syndrome. Start citalopram 20 mg half tab by mouth daily at bedtime. Take this for at least a week and if needed increase to one whole tab at bedtime. Given prescription for Klonopin to take only with extreme anxiety or panic attacks. Patient understands that should not be taken if she becomes pregnant. Recheck in 4-6 weeks, DC med and call back sooner if any problems.

## 2016-04-06 ENCOUNTER — Ambulatory Visit: Payer: BC Managed Care – PPO | Admitting: Family Medicine

## 2016-05-05 ENCOUNTER — Ambulatory Visit (INDEPENDENT_AMBULATORY_CARE_PROVIDER_SITE_OTHER): Payer: BC Managed Care – PPO | Admitting: Nurse Practitioner

## 2016-05-05 ENCOUNTER — Encounter: Payer: Self-pay | Admitting: Nurse Practitioner

## 2016-05-05 VITALS — BP 102/68 | Ht 63.0 in | Wt 135.0 lb

## 2016-05-05 DIAGNOSIS — F419 Anxiety disorder, unspecified: Principal | ICD-10-CM

## 2016-05-05 DIAGNOSIS — F32A Depression, unspecified: Secondary | ICD-10-CM

## 2016-05-05 DIAGNOSIS — F329 Major depressive disorder, single episode, unspecified: Secondary | ICD-10-CM

## 2016-05-05 DIAGNOSIS — F418 Other specified anxiety disorders: Secondary | ICD-10-CM

## 2016-05-05 NOTE — Progress Notes (Signed)
Subjective:  Presents for follow-up. Doing well on Celexa. Denies any adverse effects. Sleeping much better. Has not used her Klonopin. Has made some major life changes. Has changed job which has greatly reduced her stress. Also decrease driving time from 5-7/81-1/2 hours to 30 minutes. Now having more time with her family.   Objective:   BP 102/68 mmHg  Ht 5\' 3"  (1.6 m)  Wt 135 lb (61.236 kg)  BMI 23.92 kg/m2 NAD. Alert, oriented. Calm cheerful affect. Thoughts logical coherent and relevant. Lungs clear. Heart regular rate rhythm.   Assessment:  Problem List Items Addressed This Visit      Other   Anxiety and depression - Primary     Plan:Continue Celexa as directed. Note patient gets regular preventive health physicals. Return in about 4 months (around 09/04/2016) for recheck.

## 2016-06-02 ENCOUNTER — Other Ambulatory Visit: Payer: Self-pay | Admitting: Nurse Practitioner

## 2016-09-05 ENCOUNTER — Other Ambulatory Visit: Payer: Self-pay | Admitting: Family Medicine

## 2016-09-05 ENCOUNTER — Ambulatory Visit: Payer: BC Managed Care – PPO | Admitting: Nurse Practitioner

## 2017-01-16 ENCOUNTER — Ambulatory Visit (INDEPENDENT_AMBULATORY_CARE_PROVIDER_SITE_OTHER): Payer: BLUE CROSS/BLUE SHIELD | Admitting: Nurse Practitioner

## 2017-01-16 ENCOUNTER — Encounter: Payer: Self-pay | Admitting: Nurse Practitioner

## 2017-01-16 ENCOUNTER — Encounter: Payer: Self-pay | Admitting: Family Medicine

## 2017-01-16 VITALS — BP 126/80 | Temp 98.7°F | Ht 63.0 in | Wt 153.1 lb

## 2017-01-16 DIAGNOSIS — J020 Streptococcal pharyngitis: Secondary | ICD-10-CM

## 2017-01-16 LAB — POCT RAPID STREP A (OFFICE): Rapid Strep A Screen: NEGATIVE

## 2017-01-16 MED ORDER — AZITHROMYCIN 250 MG PO TABS: ORAL_TABLET | ORAL | 0 refills | Status: DC

## 2017-01-17 ENCOUNTER — Encounter: Payer: Self-pay | Admitting: Nurse Practitioner

## 2017-01-17 NOTE — Progress Notes (Signed)
Subjective:  Presents for c/o loss of voice and sore throat progressively worse over the past 4 days. No fever. Headache. No runny nose, cough, ear pain or wheezing. No head congestion. No N/V or abd pain. No rash. Taking fluids well. Voiding nl.   Objective:   BP 126/80   Temp 98.7 F (37.1 C) (Oral)   Ht 5\' 3"  (1.6 m)   Wt 153 lb 2 oz (69.5 kg)   BMI 27.12 kg/m  NAD. Alert, oriented. TMs mild clear effusion. Pharynx mild erythema with palatal petechiae. Neck supple with mild anterior adenopathy. Lungs clear. Heart RRR.   Assessment:  Strep pharyngitis - Plan: POCT rapid strep A, CANCELED: Strep A DNA probe    Plan:   Meds ordered this encounter  Medications  . azithromycin (ZITHROMAX Z-PAK) 250 MG tablet    Sig: Take 2 tablets (500 mg) on  Day 1,  followed by 1 tablet (250 mg) once daily on Days 2 through 5.    Dispense:  6 each    Refill:  0    Order Specific Question:   Supervising Provider    Answer:   Merlyn AlbertLUKING, WILLIAM S [[id number]]   Reviewed symptomatic care and warning signs. Call back in 72 hours if no improvement, sooner if worse.

## 2017-01-23 ENCOUNTER — Telehealth: Payer: Self-pay | Admitting: Family Medicine

## 2017-01-23 NOTE — Telephone Encounter (Signed)
[facility] is [facility]

## 2017-01-25 ENCOUNTER — Other Ambulatory Visit: Payer: Self-pay | Admitting: Nurse Practitioner

## 2017-01-25 MED ORDER — CITALOPRAM HYDROBROMIDE 20 MG PO TABS: 20.0000 mg | ORAL_TABLET | Freq: Every day | ORAL | 2 refills | Status: DC

## 2017-01-25 NOTE — Telephone Encounter (Signed)
done

## 2017-03-13 ENCOUNTER — Encounter: Payer: Self-pay | Admitting: Family Medicine

## 2017-03-13 ENCOUNTER — Ambulatory Visit (INDEPENDENT_AMBULATORY_CARE_PROVIDER_SITE_OTHER): Payer: BLUE CROSS/BLUE SHIELD | Admitting: Family Medicine

## 2017-03-13 VITALS — BP 120/70 | Temp 98.6°F | Ht 63.0 in | Wt 148.4 lb

## 2017-03-13 DIAGNOSIS — B349 Viral infection, unspecified: Secondary | ICD-10-CM | POA: Diagnosis not present

## 2017-03-13 DIAGNOSIS — I889 Nonspecific lymphadenitis, unspecified: Secondary | ICD-10-CM

## 2017-03-13 DIAGNOSIS — J029 Acute pharyngitis, unspecified: Secondary | ICD-10-CM | POA: Diagnosis not present

## 2017-03-13 MED ORDER — AMOXICILLIN 500 MG PO TABS: 500.0000 mg | ORAL_TABLET | Freq: Three times a day (TID) | ORAL | 0 refills | Status: DC

## 2017-03-13 NOTE — Progress Notes (Signed)
   Subjective:    Patient ID: Stacey Compton, female    DOB: 1985/12/29, 31 y.o.   MRN: [id number]  Sinusitis  This is a new problem. The current episode started in the past 7 days. Associated symptoms include congestion, headaches, sinus pressure and a sore throat. Pertinent negatives include no coughing, diaphoresis, ear pain or shortness of breath. (Muscle Aches Vomiting,Diarrhea) Treatments tried: Zyrtec, Tylenl, Immodium.  Viral like illness from last week. Some congestion drainage coughing that seemed to clear up and get go away also with some muscle aches and diarrhea viral like illness over the weekend now with some sore throat the right side of the throat is mildly tender on the external aspect and down the back of the right side of the neck denies any high fever chills or sweats denies wheezing difficulty breathing Patient states no other concerns this visit.   Review of Systems  Constitutional: Positive for fatigue. Negative for activity change, diaphoresis and fever.  HENT: Positive for congestion, rhinorrhea, sinus pressure and sore throat. Negative for ear pain.   Eyes: Negative for discharge.  Respiratory: Negative for cough, shortness of breath and wheezing.   Cardiovascular: Negative for chest pain.  Gastrointestinal: Positive for diarrhea, nausea and vomiting. Negative for blood in stool and constipation.  Neurological: Positive for headaches.       Objective:   Physical Exam  Constitutional: She appears well-developed.  HENT:  Head: Normocephalic.  Nose: Nose normal.  Mouth/Throat: Oropharynx is clear and moist. No oropharyngeal exudate.  Neck: Neck supple.  Cardiovascular: Normal rate and normal heart sounds.   No murmur heard. Pulmonary/Chest: Effort normal and breath sounds normal. She has no wheezes.  Lymphadenopathy:    She has no cervical adenopathy.  Skin: Skin is warm and dry.  Nursing note and vitals reviewed.         Assessment & Plan:  Viral  syndrome Recent allergy issues OTC measures Cervical lymphadenitis amoxicillin 10 days as directed more than likely related to viral but will use antibiotics to cover  If not improving over the next 7 days notify us may need to do lab work

## 2017-04-23 ENCOUNTER — Other Ambulatory Visit: Payer: Self-pay | Admitting: Nurse Practitioner

## 2017-04-24 NOTE — Telephone Encounter (Signed)
May have one refill recommend office visit

## 2017-04-24 NOTE — Telephone Encounter (Signed)
One refill needs follow-up office 

## 2017-05-12 ENCOUNTER — Ambulatory Visit (INDEPENDENT_AMBULATORY_CARE_PROVIDER_SITE_OTHER): Payer: BLUE CROSS/BLUE SHIELD | Admitting: Family Medicine

## 2017-05-12 ENCOUNTER — Encounter: Payer: Self-pay | Admitting: Family Medicine

## 2017-05-12 VITALS — BP 110/70 | Ht 63.0 in | Wt 151.0 lb

## 2017-05-12 DIAGNOSIS — M791 Myalgia, unspecified site: Secondary | ICD-10-CM

## 2017-05-12 DIAGNOSIS — M62838 Other muscle spasm: Secondary | ICD-10-CM | POA: Diagnosis not present

## 2017-05-12 MED ORDER — CITALOPRAM HYDROBROMIDE 20 MG PO TABS: 20.0000 mg | ORAL_TABLET | Freq: Every day | ORAL | 12 refills | Status: DC

## 2017-05-12 MED ORDER — CHLORZOXAZONE 500 MG PO TABS: 500.0000 mg | ORAL_TABLET | Freq: Three times a day (TID) | ORAL | 2 refills | Status: DC | PRN

## 2017-05-12 NOTE — Progress Notes (Signed)
   Subjective:    Patient ID: Stacey Compton, female    DOB: Jan 30, 1986, 31 y.o.   MRN: [id number]  HPI Patient in today for muscle cramps to bilateral legs. Onset a few weeks ago. Also has c/o neck and back pain. Has tried ibuprofen, ice, heat, stretches, and icy hot.  She relates cramping and IN her feet her toes drop also cramping in the arms her sides denies chest tightness pressure pain or shortness of breath. Has concerns of discoloration to lower left leg. Denies any swelling to the lower leg. Just a area on the left side of her leg gets discolored. Review of Systems  Constitutional: Negative for chills and fatigue.  Respiratory: Negative for cough, chest tightness and shortness of breath.   Musculoskeletal: Positive for myalgias.  Neurological: Negative for dizziness, seizures and numbness.       Objective:   Physical Exam  Constitutional: She appears well-nourished. No distress.  HENT:  Head: Normocephalic.  Cardiovascular: Normal rate, regular rhythm and normal heart sounds.   No murmur heard. Pulmonary/Chest: Effort normal and breath sounds normal.  Musculoskeletal: She exhibits no edema.  Lymphadenopathy:    She has no cervical adenopathy.  Neurological: She is alert.  Psychiatric: Her behavior is normal.  Vitals reviewed.  No obvious discomfort coloration of the lower leg. Pulses are good Find no evidence of circulation issues or swelling      Assessment & Plan:  Muscle cramps Will do lab work Muscle relaxer as needed Stretching exercises recommended  Discoloration of leg I don't find evidence pointing toward any type of circulation issue. If ongoing trouble she is to take pictures of it and then report these back to us

## 2017-05-13 LAB — CBC WITH DIFFERENTIAL/PLATELET
Basophils Absolute: 0 10*3/uL (ref 0.0–0.2)
Basos: 0 %
EOS (ABSOLUTE): 0.3 10*3/uL (ref 0.0–0.4)
Eos: 4 %
Hematocrit: 42.9 % (ref 34.0–46.6)
Hemoglobin: 14.3 g/dL (ref 11.1–15.9)
Immature Grans (Abs): 0 10*3/uL (ref 0.0–0.1)
Immature Granulocytes: 0 %
Lymphocytes Absolute: 2.2 10*3/uL (ref 0.7–3.1)
Lymphs: 32 %
MCH: 30.4 pg (ref 26.6–33.0)
MCHC: 33.3 g/dL (ref 31.5–35.7)
MCV: 91 fL (ref 79–97)
Monocytes Absolute: 0.4 10*3/uL (ref 0.1–0.9)
Monocytes: 6 %
Neutrophils Absolute: 4 10*3/uL (ref 1.4–7.0)
Neutrophils: 58 %
Platelets: 326 10*3/uL (ref 150–379)
RBC: 4.7 x10E6/uL (ref 3.77–5.28)
RDW: 13.2 % (ref 12.3–15.4)
WBC: 6.9 10*3/uL (ref 3.4–10.8)

## 2017-05-13 LAB — BASIC METABOLIC PANEL
BUN/Creatinine Ratio: 8 — ABNORMAL LOW (ref 9–23)
BUN: 6 mg/dL (ref 6–20)
CO2: 24 mmol/L (ref 20–29)
Calcium: 9.2 mg/dL (ref 8.7–10.2)
Chloride: 100 mmol/L (ref 96–106)
Creatinine, Ser: 0.75 mg/dL (ref 0.57–1.00)
GFR calc Af Amer: 122 mL/min/{1.73_m2} (ref >59)
GFR calc non Af Amer: 106 mL/min/{1.73_m2} (ref >59)
Glucose: 93 mg/dL (ref 65–99)
Potassium: 4.4 mmol/L (ref 3.5–5.2)
Sodium: 136 mmol/L (ref 134–144)

## 2017-05-13 LAB — CK: Total CK: 114 U/L (ref 24–173)

## 2017-05-13 LAB — T3: T3, Total: 153 ng/dL (ref 71–180)

## 2017-05-13 LAB — T4, FREE: Free T4: 1.25 ng/dL (ref 0.82–1.77)

## 2017-05-13 LAB — TSH: TSH: 1.38 u[IU]/mL (ref 0.450–4.500)

## 2017-06-27 ENCOUNTER — Other Ambulatory Visit: Payer: Self-pay | Admitting: *Deleted

## 2017-06-27 ENCOUNTER — Telehealth: Payer: Self-pay | Admitting: Family Medicine

## 2017-06-27 MED ORDER — RIZATRIPTAN BENZOATE 10 MG PO TABS: ORAL_TABLET | ORAL | 3 refills | Status: DC

## 2017-06-27 NOTE — Telephone Encounter (Signed)
Patient is having a really bad migraine today.  She is requesting refill on rizatriptan (MAXALT) 10 MG tablet.  [facility] [place]

## 2017-06-27 NOTE — Telephone Encounter (Signed)
Last seen for migraines over one year ago 12/2015.

## 2017-06-27 NOTE — Telephone Encounter (Signed)
She may have 3 refills on her migraine medicine-follow-up if ongoing troubles

## 2017-06-27 NOTE — Telephone Encounter (Signed)
Discussed with pt. And refills sent to pharm.

## 2017-06-29 ENCOUNTER — Encounter: Payer: Self-pay | Admitting: Family Medicine

## 2017-06-29 ENCOUNTER — Ambulatory Visit (INDEPENDENT_AMBULATORY_CARE_PROVIDER_SITE_OTHER): Payer: BLUE CROSS/BLUE SHIELD | Admitting: Family Medicine

## 2017-06-29 VITALS — BP 102/64 | Temp 98.1°F | Ht 63.0 in | Wt 150.0 lb

## 2017-06-29 DIAGNOSIS — G43109 Migraine with aura, not intractable, without status migrainosus: Secondary | ICD-10-CM

## 2017-06-29 MED ORDER — PROMETHAZINE HCL 25 MG PO TABS: 25.0000 mg | ORAL_TABLET | Freq: Four times a day (QID) | ORAL | 0 refills | Status: DC | PRN

## 2017-06-29 MED ORDER — TRAMADOL HCL 50 MG PO TABS: ORAL_TABLET | ORAL | 0 refills | Status: DC

## 2017-06-29 MED ORDER — TIZANIDINE HCL 4 MG PO CAPS: 4.0000 mg | ORAL_CAPSULE | Freq: Three times a day (TID) | ORAL | 0 refills | Status: DC | PRN

## 2017-06-29 MED ORDER — TOPIRAMATE 50 MG PO TABS: 50.0000 mg | ORAL_TABLET | Freq: Two times a day (BID) | ORAL | 3 refills | Status: DC

## 2017-06-29 NOTE — Progress Notes (Signed)
   Subjective:    Patient ID: Stacey Compton, female    DOB: [birth year], 31 y.o.   MRN: [id number]  Migraine   This is a recurrent problem. The current episode started in the past 7 days. Associated symptoms include blurred vision and dizziness. The symptoms are aggravated by bright light. The treatment provided no relief.  She states she fell this morning from being dizzy.States she has tried the [employer] with no relief. Has had migraines for the past several years is currently having multiple migraines per month for 5 per month. Sometimes with numbness or tingling in one side or the other sometimes with nausea and intermittent vomiting. She states earlier this line she felt bad she was urinating she stood up to go out shower and she passed out briefly she states she struck the left side of her body in the left part of her lower part of her neck but denies any numbness tingling   Review of Systems  Eyes: Positive for blurred vision.  Neurological: Positive for dizziness.       Objective:   Physical Exam  Constitutional: She appears well-developed and well-nourished. No distress.  HENT:  Head: Normocephalic and atraumatic.  Eyes: Right eye exhibits no discharge. Left eye exhibits no discharge.  Neck: No tracheal deviation present.  Cardiovascular: Normal rate, regular rhythm and normal heart sounds.   No murmur heard. Pulmonary/Chest: Effort normal and breath sounds normal. No respiratory distress. She has no wheezes. She has no rales.  Musculoskeletal: She exhibits no edema.  Lymphadenopathy:    She has no cervical adenopathy.  Neurological: She is alert. She exhibits normal muscle tone.  Skin: Skin is warm and dry. No erythema.  Psychiatric: Her behavior is normal.  Vitals reviewed.  I feel that her passing out was vasovagal I think it's best for her to make sure she takes in plenty of liquids in moves about slowly takes work off today and tomorrow should be able to return to work  on Monday  15 minutes spent with patient greater than half in discussion     Assessment & Plan:  Significant frequent migraines with complex nature Start Topamax 50 mg twice daily Phenergan as needed for nausea tramadol as needed for discomfort patient states she's taken this before without trouble Referral for neurology consultation per patient's request

## 2017-07-03 ENCOUNTER — Encounter: Payer: Self-pay | Admitting: Family Medicine

## 2017-07-03 ENCOUNTER — Telehealth: Payer: Self-pay | Admitting: Family Medicine

## 2017-07-03 MED ORDER — PREDNISONE 20 MG PO TABS: ORAL_TABLET | ORAL | 0 refills | Status: DC

## 2017-07-03 NOTE — Telephone Encounter (Signed)
Patient still has migraines and not any better ,states still having dizziness,trouble seeing, bad headache,taking medication maxalt 10 mg,zanaflex 4mg . She wants to if she needs to come in again or be put on different medication.

## 2017-07-03 NOTE — Telephone Encounter (Signed)
Patient stated she has tried tramadol twice a day- her vision is blurry and very sensitive to light- continued migraine symptoms

## 2017-07-03 NOTE — Telephone Encounter (Signed)
If the tramadol and if the Zanaflex are not helping I would not recommend continuing them. They may be contributing to the dizziness. I believe it's reasonable to try low-dose prednisone 20 mg 2 tablets daily for the next 5 days. In addition to this please have Stacey Compton connect with [name] to see if they could work her in urgently because of her headaches. If the patient feels things are getting worse it would be wise for her to be rechecked by [place] this week. Often it can take up to a several weeks for the specialists to be able to see someone. Patient may end up needing MRI of the brain. If she needs a work note please grant this to her.

## 2017-07-03 NOTE — Telephone Encounter (Signed)
Nurse's-please touch base with the patient find out if they are trying tramadol? In regards to his symptoms stating cannot see what this patient mean by this.

## 2017-07-03 NOTE — Telephone Encounter (Signed)
Patient advised If the tramadol and if the Zanaflex are not helping Dr Lorin Picket would not recommend continuing them. They may be contributing to the dizziness. Dr Lorin Picket believes it's reasonable to try low-dose prednisone 20 mg 2 tablets daily for the next 5 days. In addition to this please have Brendale connect with [name] to see if they could work her in urgently because of her headaches. If the patient feels things are getting worse it would be wise for her to be rechecked by [place] this week. Often it can take up to a several weeks for the specialists to be able to see someone. Patient may end up needing MRI of the brain. If she needs a work note please grant this to her. Patient verbalized understanding. Prescription sent electronically to pharmacy.

## 2017-07-05 ENCOUNTER — Telehealth: Payer: Self-pay | Admitting: Family Medicine

## 2017-07-05 NOTE — Telephone Encounter (Signed)
Urgent referral was just sent to neurology yesterday as requested-they call and schedule with the patient themselves for urgent work ins and patient should be receiving call today about urgent appointment-patient on prednisone times one day but it has not helped

## 2017-07-05 NOTE — Telephone Encounter (Incomplete)
Spoke to patient, offered her a 1pm appointment tomorrow or a noon on Friday.

## 2017-07-05 NOTE — Telephone Encounter (Signed)
Patient is seeing Dr. Epimenio Foot tomorro, thanks

## 2017-07-05 NOTE — Telephone Encounter (Signed)
Patient called and said her symptoms are getting worse as the day goes on.  She is requesting a call back ASAP.

## 2017-07-05 NOTE — Telephone Encounter (Signed)
I have discussed this with the patient we have sent in a phone call to neurology I'm waiting to hear back

## 2017-07-05 NOTE — Telephone Encounter (Signed)
Nurse's-please call the patient-Please let the patient know that I did discuss her case with Dr.Toni Lucia GaskinsAhern -let her know that she is a very good neurologist with [name]. They will work her in most likely tomorrow to be seen.Dr.Ahern stated that they will look over her note and call her with appointment for tomorrow. At that point they should be able to help figure out what would be the best approach to help her headache get better.

## 2017-07-05 NOTE — Telephone Encounter (Signed)
Discussed with pt. Pt verbalized understanding and states they have already called her with appt for tomorrow at 9:30

## 2017-07-05 NOTE — Telephone Encounter (Signed)
Patient said that she is having continued migraines.  The medication she was prescribed isn't really helping.  She said she hasn't heard anything from the neurologist we referred her to.  She wants to know what Dr. Lorin PicketScott recommends?

## 2017-07-06 ENCOUNTER — Ambulatory Visit (INDEPENDENT_AMBULATORY_CARE_PROVIDER_SITE_OTHER): Payer: BLUE CROSS/BLUE SHIELD | Admitting: Neurology

## 2017-07-06 ENCOUNTER — Encounter: Payer: Self-pay | Admitting: Neurology

## 2017-07-06 VITALS — BP 104/66 | HR 68 | Resp 14 | Ht 63.0 in | Wt 152.0 lb

## 2017-07-06 DIAGNOSIS — G43109 Migraine with aura, not intractable, without status migrainosus: Secondary | ICD-10-CM | POA: Diagnosis not present

## 2017-07-06 DIAGNOSIS — R51 Headache: Secondary | ICD-10-CM

## 2017-07-06 DIAGNOSIS — R519 Headache, unspecified: Secondary | ICD-10-CM

## 2017-07-06 DIAGNOSIS — M542 Cervicalgia: Secondary | ICD-10-CM

## 2017-07-06 MED ORDER — INDOMETHACIN 25 MG PO CAPS: ORAL_CAPSULE | ORAL | 0 refills | Status: DC

## 2017-07-06 NOTE — Progress Notes (Signed)
[name]  PATIENT: Stacey Compton DOB: [birth year]  REFERRING DOCTOR OR PCP:  Lilyan PuntScott Luking SOURCE: patient, Notes from Dr. Gerda DissLuking, imaging reports, MRI images on PACS  _________________________________   HISTORICAL  CHIEF COMPLAINT:  Chief Complaint  Patient presents with  . Headache    Lyliana is here for eval of h/a's onset age 31.  Has had current h/a for one wk.  Started Topamax one wk. ago and is also on Prdnisone.  Maxalt usually helps but has not helped with this h/a. Has not tried any daily preventatives other than Topamax./fim    HISTORY OF PRESENT ILLNESS:  I had the pleasure seeing you patient, Risk managerAmber Degenhart, [facility] for a neurologic consultation regarding her headaches.  Currently, she has a daily headache over the last 8 days.    Pain is located in the left forehead and the left occiput.   The pain started with throbbing pain on the left associated with nasuea and vomiting, photophobia and phonophobia.   Moving worsened the pain.  Laying in ned slightly helps.     Maxalt and Aleve which usually helped did not help at all (has tried several times) The next day, she woke up and when she got out of bed she was lightheaded and passed out.   She has a ringing in her left ear. She has mostly stayed in bed and tried tramadol without benefit.   She was placed on prednisone on Monday and phenergan yesterday.   Pain is fluctuating between moderate and severe and she feels.   Topamax was started a few days ago.   She took it in the past and it helped but was poorly tolerated.    Current headache is longest one ever experienced (others would last 1-2 days).     She has a long history of episodic migraines. She often gets visual aura appear they are always associated with nausea and usually with vomiting. She always has photophobia and phonophobia. Pain is usually better if she lays down in a dark room.  At times they were rare but this year  they are occurring weekly for one or two days each.   Aleve often helps.  Maxalt helps if Aleve does not.      I personally reviewed the MRI of the brain dated 10/02/2013. 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   REVIEW OF SYSTEMS: Constitutional: No fevers, chills, sweats, or change in appetite Eyes: No visual changes, double vision, eye pain Ear, nose and throat: No hearing loss, ear pain, nasal congestion, sore throat Cardiovascular: No chest pain, palpitations Respiratory: No shortness of breath at rest or with exertion.   No wheezes GastrointestinaI: No nausea, vomiting, diarrhea, abdominal pain, fecal incontinence Genitourinary: No dysuria, urinary retention or frequency.  No nocturia. Musculoskeletal: No neck pain, back pain Integumentary: No rash, pruritus, skin lesions Neurological: as above Psychiatric: No depression at this time.  No anxiety Endocrine: No palpitations, diaphoresis, change in appetite, change in weigh or increased thirst Hematologic/Lymphatic: No anemia, purpura, petechiae. Allergic/Immunologic: No itchy/runny eyes, nasal congestion, recent allergic reactions, rashes  ALLERGIES: Allergies  Allergen Reactions  . Hydrocodone-Homatropine Nausea And Vomiting and Rash    Hycodan   . Percocet [Oxycodone-Acetaminophen] Hives and Rash    HOME MEDICATIONS:  Current Outpatient Prescriptions:  .  albuterol (PROVENTIL HFA;VENTOLIN HFA) 108 (90 BASE) MCG/ACT inhaler, Inhale 2 puffs into the lungs every 6 (six) hours as needed for wheezing or  shortness of breath., Disp: 1 Inhaler, Rfl: 2 .  chlorzoxazone (PARAFON FORTE DSC) 500 MG tablet, Take 1 tablet (500 mg total) by mouth 3 (three) times daily as needed for muscle spasms., Disp: 45 tablet, Rfl: 2 .  citalopram (CELEXA) 20 MG tablet, Take 1 tablet (20 mg total) by mouth daily., Disp: 30 tablet, Rfl: 12 .  clonazePAM (KLONOPIN) 0.5 MG tablet, 1/2-1 po BID prn anxiety, Disp: 20  tablet, Rfl: 0 .  CRYSELLE-28 0.3-30 MG-MCG tablet, Take 1 tablet by mouth daily., Disp: , Rfl: 11 .  predniSONE (DELTASONE) 20 MG tablet, 2 tablets daily for 5 days, Disp: 10 tablet, Rfl: 0 .  promethazine (PHENERGAN) 25 MG tablet, Take 1 tablet (25 mg total) by mouth every 6 (six) hours as needed for nausea., Disp: 30 tablet, Rfl: 0 .  rizatriptan (MAXALT) 10 MG tablet, Take 1 tablet at first sign of migraine. May repeat 2 hours later., Disp: 18 tablet, Rfl: 3 .  tiZANidine (ZANAFLEX) 4 MG capsule, Take 1 capsule (4 mg total) by mouth 3 (three) times daily as needed for muscle spasms., Disp: 30 capsule, Rfl: 0 .  topiramate (TOPAMAX) 50 MG tablet, Take 1 tablet (50 mg total) by mouth 2 (two) times daily., Disp: 60 tablet, Rfl: 3  PAST MEDICAL HISTORY: Past Medical History:  Diagnosis Date  . Anxiety   . Depression   . Endometriosis   . Hx of varicella   . IBS (irritable bowel syndrome)   . Migraines    with aura    PAST SURGICAL HISTORY: Past Surgical History:  Procedure Laterality Date  . LAPAROSCOPY     with chromopertubation    FAMILY HISTORY: Family History  Problem Relation Age of Onset  . Fibromyalgia Mother   . Depression Father   . Anxiety disorder Sister   . Bipolar disorder Maternal Aunt   . Cancer Maternal Grandmother        mouth and lung  . Bipolar disorder Maternal Grandfather   . Heart disease Paternal Grandfather     SOCIAL HISTORY:  Social History   Social History  . Marital status: Married    Spouse name: N/A  . Number of children: N/A  . Years of education: N/A   Occupational History  . Not on file.   Social History Main Topics  . Smoking status: Never Smoker  . Smokeless tobacco: Never Used  . Alcohol use No  . Drug use: No  . Sexual activity: Not Currently    Birth control/ protection: None   Other Topics Concern  . Not on file   Social History Narrative  . No narrative on file     PHYSICAL EXAM  Vitals:   07/06/17 1005    BP: 104/66  Pulse: 68  Resp: 14  Weight: 152 lb (68.9 kg)  Height: 5\' 3"  (1.6 m)    Body mass index is 26.93 kg/m.   General: The patient is well-developed and well-nourished and in no acute distress  Eyes:  Funduscopic exam shows normal optic discs and retinal vessels.  Venous pulsations are present.  Neck: The neck is supple, no carotid bruits are noted.  The neck is very tender over the left occiput.  Cardiovascular: The heart has a regular rate and rhythm with a normal S1 and S2. There were no murmurs, gallops or rubs. Lungs are clear to auscultation.  Skin: Extremities are without significant edema.  Musculoskeletal:  Back is nontender  Neurologic Exam  Mental status: The patient is  alert and oriented x 3 at the time of the examination. The patient has apparent normal recent and remote memory, with an apparently normal attention span and concentration ability.   Speech is normal.  Cranial nerves: Extraocular movements are full. Pupils are equal, round, and reactive to light and accomodation.  Visual fields are full.  Facial symmetry is present. There is good facial sensation to soft touch bilaterally.Facial strength is normal.  Trapezius and sternocleidomastoid strength is normal. No dysarthria is noted.  The tongue is midline, and the patient has symmetric elevation of the soft palate. No obvious hearing deficits are noted.  Motor:  Muscle bulk is normal.   Tone is normal. Strength is  5 / 5 in all 4 extremities.   Sensory: Sensory testing is intact to pinprick, soft touch and vibration sensation in all 4 extremities.  Coordination: Cerebellar testing reveals good finger-nose-finger and heel-to-shin bilaterally.  Gait and station: Station is normal.   Gait is normal. Tandem gait is normal. Romberg is negative.   Reflexes: Deep tendon reflexes are symmetric and normal bilaterally.   Plantar responses are flexor.    DIAGNOSTIC DATA (LABS, IMAGING, TESTING) - I reviewed  patient records, labs, notes, testing and imaging myself where available.  Lab Results  Component Value Date   WBC 6.9 05/12/2017   HGB 14.3 05/12/2017   HCT 42.9 05/12/2017   MCV 91 05/12/2017   PLT 326 05/12/2017    Lab Results  Component Value Date   TSH 1.380 05/12/2017       ASSESSMENT AND PLAN  Migraine with aura and without status migrainosus, not intractable  Intractable headache, unspecified chronicity pattern, unspecified headache type  Neck pain   In summary, Latausha Flamm is a 31 year old woman with a severe headache over the past 8 days migrainous features. She also has a history of episodic classic migraine headaches. I believe that the over the left splenius capitis and occipital nerve is perpetuating her migraine. I did a left splenius capitis trigger point injection with 80 mg Depo-Medrol in 3 mL Marcaine. After several minutes pain improved by more than half. Hopefully this will help to break the current daily headache. Additionally I wrote a prescription for indomethacin that could be taken up to 3 times a day if the headaches do not completely resolve or if they recur. I did not schedule follow-up but advised her to call us if she has any new or worsening neurologic symptoms or if the headaches recur.  She will continue on the Topamax for now.  Thank you for asking me to see Mrs. Demattia. Please let me know if I can be of further assistance with her or other patients in the future. onb indomethacin   Abbygale Lapid A. Epimenio Foot, MD, [facility] 07/06/2017, 10:11 AM Certified in Neurology, Clinical Neurophysiology, Sleep Medicine, Pain Medicine and Neuroimaging  [facility] [address] [place], [place] [address] [phone]

## 2017-08-24 ENCOUNTER — Ambulatory Visit (INDEPENDENT_AMBULATORY_CARE_PROVIDER_SITE_OTHER): Payer: BLUE CROSS/BLUE SHIELD | Admitting: Family Medicine

## 2017-08-24 ENCOUNTER — Encounter: Payer: Self-pay | Admitting: Family Medicine

## 2017-08-24 VITALS — BP 110/68 | Temp 98.8°F | Ht 63.0 in | Wt 152.0 lb

## 2017-08-24 DIAGNOSIS — J329 Chronic sinusitis, unspecified: Secondary | ICD-10-CM | POA: Diagnosis not present

## 2017-08-24 DIAGNOSIS — J4520 Mild intermittent asthma, uncomplicated: Secondary | ICD-10-CM

## 2017-08-24 DIAGNOSIS — J310 Chronic rhinitis: Secondary | ICD-10-CM

## 2017-08-24 MED ORDER — ALBUTEROL SULFATE HFA 108 (90 BASE) MCG/ACT IN AERS: 2.0000 | INHALATION_SPRAY | Freq: Four times a day (QID) | RESPIRATORY_TRACT | 2 refills | Status: DC | PRN

## 2017-08-24 MED ORDER — CEFDINIR 300 MG PO CAPS: 300.0000 mg | ORAL_CAPSULE | Freq: Two times a day (BID) | ORAL | 0 refills | Status: DC

## 2017-08-24 NOTE — Progress Notes (Signed)
   Subjective:    Patient ID: Stacey Compton, female    DOB: 1985/12/31, 31 y.o.   MRN: [id number]  HPIcough, runny nose, headache, sorethroat, fever, bodyaches, wheezing. Started yesterday. Taking tylenol, zyrtec, and inhaler.    Hit yest afternoon rather hard  Felt bad at work   Sore throat sorse      Tickle in the throat   Body cesm t max 101.5   using tylenol prn   Son sick with mild viral like symptoms ealier in the week   Review of Systems No headache, no major weight loss or weight gain, no chest pain no back pain abdominal pain no change in bowel habits complete ROS otherwise negative     Objective:   Physical Exam  Alert active good hydration positive nasal congestion. Positive frontal tenderness pharynx normal neck supplelungs clear intermittent deep bronchial cough , positive reactive airways with deep breath,heart regular in rhythm.      Assessment & Plan:  Impression probable parainfluenza or similar with secondary sinusitis/bronchitis and exacerbation of reactive airways plan antibiotics prescribed. Inhaler prescribed symptom care discussed

## 2017-08-30 ENCOUNTER — Ambulatory Visit (INDEPENDENT_AMBULATORY_CARE_PROVIDER_SITE_OTHER): Payer: BLUE CROSS/BLUE SHIELD | Admitting: Family Medicine

## 2017-08-30 ENCOUNTER — Encounter: Payer: Self-pay | Admitting: Family Medicine

## 2017-08-30 VITALS — BP 122/70 | Temp 98.7°F | Ht 63.0 in | Wt 155.0 lb

## 2017-08-30 DIAGNOSIS — J4541 Moderate persistent asthma with (acute) exacerbation: Secondary | ICD-10-CM

## 2017-08-30 MED ORDER — PREDNISONE 10 MG PO TABS: ORAL_TABLET | ORAL | 0 refills | Status: DC

## 2017-08-30 MED ORDER — FLUTICASONE PROPIONATE HFA 44 MCG/ACT IN AERO: INHALATION_SPRAY | RESPIRATORY_TRACT | 1 refills | Status: DC

## 2017-08-30 NOTE — Progress Notes (Signed)
   Subjective:    Patient ID: Stacey HoffAmber R Salvucci, female    DOB: 1986-07-15, 31 y.o.   MRN: [id number]  Pt found out this past Saturday she is pregnant.   Cough  This is a new problem. Episode onset: one week. Associated symptoms include headaches and wheezing. Associated symptoms comments: vomiting. Treatments tried: antibiotic, inhaler, robitussin, cough drops.   Pt is pregnant now  Pt now off celexa  Still using inhaler and antibiotics  Bad cough eads to vomiting  No hx f wheezing as a child Used an inhaler around two yrs ago first  Was in earlier in the mo with   Sig   Cough not great at night  But also not terrible  Using inhaler four times per day      Review of Systems  Respiratory: Positive for cough and wheezing.   Neurological: Positive for headaches.       Objective:   Physical Exam Alert vitals stable, NAD. Blood pressure good on repeat. HEENT normal. Lungs clear. Heart regular rate and rhythm. Rare wheeze with expiration intermittent cough       Assessment & Plan:  mpression exacerbation asthma with recentparainfluenz, discussed at length, patient is pregnant, willate oral steroids haled Floven

## 2017-09-18 LAB — OB RESULTS CONSOLE RPR: RPR: NONREACTIVE

## 2017-09-18 LAB — OB RESULTS CONSOLE ABO/RH: RH Type: POSITIVE

## 2017-09-18 LAB — OB RESULTS CONSOLE RUBELLA ANTIBODY, IGM: Rubella: IMMUNE

## 2017-09-18 LAB — OB RESULTS CONSOLE HEPATITIS B SURFACE ANTIGEN: Hepatitis B Surface Ag: NEGATIVE

## 2017-09-18 LAB — OB RESULTS CONSOLE ANTIBODY SCREEN: Antibody Screen: NEGATIVE

## 2017-09-18 LAB — OB RESULTS CONSOLE GC/CHLAMYDIA
Chlamydia: NEGATIVE
Gonorrhea: NEGATIVE

## 2017-09-18 LAB — OB RESULTS CONSOLE HIV ANTIBODY (ROUTINE TESTING): HIV: NONREACTIVE

## 2017-11-07 NOTE — L&D Delivery Note (Signed)
Delivery Note  SVD viable female Apgars 9,9 over 2nd degree ML lac.  Placenta delivered spontaneously intact with 3VC. Repair with 2-0 Chromic with good support and hemostasis noted.  R/V exam confirms.  PH art was sent.   Mother and baby to couplet care and are doing well.  EBL 250cc  Candice Campavid Kristine Chahal, MD

## 2018-01-11 ENCOUNTER — Encounter (HOSPITAL_COMMUNITY): Payer: Self-pay | Admitting: *Deleted

## 2018-01-11 ENCOUNTER — Inpatient Hospital Stay (HOSPITAL_COMMUNITY)
Admission: AD | Admit: 2018-01-11 | Discharge: 2018-01-11 | Disposition: A | Discharge status: Home / Self Care | Payer: BLUE CROSS/BLUE SHIELD | Source: Ambulatory Visit | Attending: Obstetrics and Gynecology | Admitting: Obstetrics and Gynecology

## 2018-01-11 ENCOUNTER — Other Ambulatory Visit: Payer: Self-pay

## 2018-01-11 DIAGNOSIS — Z833 Family history of diabetes mellitus: Secondary | ICD-10-CM | POA: Insufficient documentation

## 2018-01-11 DIAGNOSIS — Z885 Allergy status to narcotic agent status: Secondary | ICD-10-CM | POA: Insufficient documentation

## 2018-01-11 DIAGNOSIS — Z8742 Personal history of other diseases of the female genital tract: Secondary | ICD-10-CM | POA: Diagnosis not present

## 2018-01-11 DIAGNOSIS — O210 Mild hyperemesis gravidarum: Secondary | ICD-10-CM | POA: Diagnosis not present

## 2018-01-11 DIAGNOSIS — R112 Nausea with vomiting, unspecified: Secondary | ICD-10-CM | POA: Diagnosis present

## 2018-01-11 DIAGNOSIS — Z87440 Personal history of urinary (tract) infections: Secondary | ICD-10-CM | POA: Insufficient documentation

## 2018-01-11 DIAGNOSIS — Z818 Family history of other mental and behavioral disorders: Secondary | ICD-10-CM | POA: Insufficient documentation

## 2018-01-11 DIAGNOSIS — Z3A25 25 weeks gestation of pregnancy: Secondary | ICD-10-CM | POA: Insufficient documentation

## 2018-01-11 HISTORY — DX: Unspecified infectious disease: B99.9

## 2018-01-11 LAB — URINALYSIS, ROUTINE W REFLEX MICROSCOPIC
Bilirubin Urine: NEGATIVE
Glucose, UA: NEGATIVE mg/dL
Hgb urine dipstick: NEGATIVE
Ketones, ur: NEGATIVE mg/dL
Nitrite: NEGATIVE
Protein, ur: NEGATIVE mg/dL
Specific Gravity, Urine: 1.012 (ref 1.005–1.030)
pH: 7 (ref 5.0–8.0)

## 2018-01-11 LAB — BASIC METABOLIC PANEL
Anion gap: 10 (ref 5–15)
BUN: 7 mg/dL (ref 6–20)
CO2: 20 mmol/L — ABNORMAL LOW (ref 22–32)
Calcium: 9.1 mg/dL (ref 8.9–10.3)
Chloride: 104 mmol/L (ref 101–111)
Creatinine, Ser: 0.36 mg/dL — ABNORMAL LOW (ref 0.44–1.00)
GFR calc Af Amer: >60 mL/min (ref >60)
GFR calc non Af Amer: >60 mL/min (ref >60)
Glucose, Bld: 75 mg/dL (ref 65–99)
Potassium: 3.8 mmol/L (ref 3.5–5.1)
Sodium: 134 mmol/L — ABNORMAL LOW (ref 135–145)

## 2018-01-11 LAB — CBC
HCT: 35 % — ABNORMAL LOW (ref 36.0–46.0)
Hemoglobin: 12.2 g/dL (ref 12.0–15.0)
MCH: 32.4 pg (ref 26.0–34.0)
MCHC: 34.9 g/dL (ref 30.0–36.0)
MCV: 92.8 fL (ref 78.0–100.0)
Platelets: 213 10*3/uL (ref 150–400)
RBC: 3.77 MIL/uL — ABNORMAL LOW (ref 3.87–5.11)
RDW: 13.7 % (ref 11.5–15.5)
WBC: 10.2 10*3/uL (ref 4.0–10.5)

## 2018-01-11 MED ORDER — LACTATED RINGERS IV BOLUS (SEPSIS)
1000.0000 mL | Freq: Once | INTRAVENOUS | Status: AC
  Administered 2018-01-11: 1000 mL via INTRAVENOUS

## 2018-01-11 MED ORDER — PROMETHAZINE HCL 25 MG/ML IJ SOLN
25.0000 mg | Freq: Once | INTRAMUSCULAR | Status: AC
  Administered 2018-01-11: 25 mg via INTRAVENOUS
  Filled 2018-01-11: qty 1

## 2018-01-11 MED ORDER — PROMETHAZINE HCL 25 MG PO TABS: 25.0000 mg | ORAL_TABLET | Freq: Four times a day (QID) | ORAL | 0 refills | Status: DC | PRN

## 2018-01-11 MED ORDER — FAMOTIDINE IN NACL 20-0.9 MG/50ML-% IV SOLN
20.0000 mg | Freq: Once | INTRAVENOUS | Status: AC
  Administered 2018-01-11: 20 mg via INTRAVENOUS
  Filled 2018-01-11: qty 50

## 2018-01-11 NOTE — MAU Note (Signed)
Pt presents with c/o N&V.  Reports able to keep food/drink down but just feels dizzy and fatigued.  Reports vomited 5 times in 24 hour period. Denies VB or LOF. Reports FM.

## 2018-01-11 NOTE — MAU Provider Note (Signed)
History     CSN: [id number]  Arrival date and time: 01/11/18 0946   First Provider Initiated Contact with Patient 01/11/18 1108      Chief Complaint  Patient presents with  . Emesis  . Dizziness  . Fatigue   HPI Stacey Compton is a 32 y.o. G2P1001 at [redacted]w[redacted]d who presents with n/v. Reports daily nausea & vomiting since beginning of pregnancy. Normally vomits once per day but since Monday has vomited 5+ times per day. Previously was taking [place] but stopped taking the beginning of January d/t constipation. Now is only taking protonix for her symptoms. States she has felt weak this week and unable to go to work. Denies abdominal pain or vaginal bleeding.   OB History    Gravida Para Term Preterm AB Living   2 1 1     1    SAB TAB Ectopic Multiple Live Births           1      Past Medical History:  Diagnosis Date  . Anxiety   . Depression    denies  . Endometriosis   . Hx of varicella   . IBS (irritable bowel syndrome)   . Infection    UTI  . Migraines    with aura    Past Surgical History:  Procedure Laterality Date  . LAPAROSCOPY     with chromopertubation  . OTHER SURGICAL HISTORY     dx lap for endometriosis    Family History  Problem Relation Age of Onset  . Fibromyalgia Mother   . Diabetes Mother        borderline  . Depression Father   . Diabetes Father        type 2  . Anxiety disorder Sister   . Bipolar disorder Maternal Aunt   . Cancer Maternal Grandmother        mouth and lung  . Bipolar disorder Maternal Grandfather   . Stroke Maternal Grandfather   . Heart disease Paternal Grandfather     Social History   Tobacco Use  . Smoking status: Never Smoker  . Smokeless tobacco: Never Used  Substance Use Topics  . Alcohol use: No  . Drug use: No    Allergies:  Allergies  Allergen Reactions  . Hydrocodone-Homatropine Nausea And Vomiting and Rash    Hycodan   . Percocet [Oxycodone-Acetaminophen] Hives and Rash    Medications Prior to  Admission  Medication Sig Dispense Refill Last Dose  . albuterol (PROVENTIL HFA;VENTOLIN HFA) 108 (90 Base) MCG/ACT inhaler Inhale 2 puffs into the lungs every 6 (six) hours as needed for wheezing or shortness of breath. 1 Inhaler 2 Taking  . cefdinir (OMNICEF) 300 MG capsule Take 1 capsule (300 mg total) by mouth 2 (two) times daily. 20 capsule 0 Taking  . citalopram (CELEXA) 20 MG tablet Take 1 tablet (20 mg total) by mouth daily. (Patient not taking: Reported on 08/30/2017) 30 tablet 12 Not Taking  . clonazePAM (KLONOPIN) 0.5 MG tablet 1/2-1 po BID prn anxiety (Patient not taking: Reported on 08/30/2017) 20 tablet 0 Not Taking  . CRYSELLE-28 0.3-30 MG-MCG tablet Take 1 tablet by mouth daily.  11 Not Taking  . fluticasone (FLOVENT HFA) 44 MCG/ACT inhaler 2 puffs bid 1 Inhaler 1   . indomethacin (INDOCIN) 25 MG capsule Take with food up to tid prn headache (Patient not taking: Reported on 08/30/2017) 60 capsule 0 Not Taking  . predniSONE (DELTASONE) 10 MG tablet Take 4 qd for 3  days, then 3 qd for 3  Days, then 2 qd for 2 days 25 tablet 0   . rizatriptan (MAXALT) 10 MG tablet Take 1 tablet at first sign of migraine. May repeat 2 hours later. (Patient not taking: Reported on 08/30/2017) 18 tablet 3 Not Taking  . tiZANidine (ZANAFLEX) 4 MG capsule Take 1 capsule (4 mg total) by mouth 3 (three) times daily as needed for muscle spasms. (Patient not taking: Reported on 08/30/2017) 30 capsule 0 Not Taking    Review of Systems  Constitutional: Negative.   Gastrointestinal: Positive for nausea and vomiting. Negative for abdominal pain and diarrhea.  Genitourinary: Negative.    Physical Exam   Blood pressure (!) 101/56, pulse 88, temperature 98.2 F (36.8 C), temperature source Oral, height 5\' 3"  (1.6 m), weight 158 lb 4 oz (71.8 kg), SpO2 96 %, unknown if currently breastfeeding.  Physical Exam  Nursing note and vitals reviewed. Constitutional: She is oriented to person, place, and time. She  appears well-developed and well-nourished. No distress.  HENT:  Head: Normocephalic and atraumatic.  Eyes: Conjunctivae are normal. Right eye exhibits no discharge. Left eye exhibits no discharge. No scleral icterus.  Neck: Normal range of motion.  Cardiovascular: Normal rate, regular rhythm and normal heart sounds.  No murmur heard. Respiratory: Effort normal and breath sounds normal. No respiratory distress. She has no wheezes.  GI: Soft. Bowel sounds are normal. There is no tenderness.  Neurological: She is alert and oriented to person, place, and time.  Skin: Skin is warm and dry. She is not diaphoretic.  Psychiatric: She has a normal mood and affect. Her behavior is normal. Judgment and thought content normal.    MAU Course  Procedures Results for orders placed or performed during the hospital encounter of 01/11/18 (from the past 24 hour(s))  Urinalysis, Routine w reflex microscopic     Status: Abnormal   Collection Time: 01/11/18 10:08 AM  Result Value Ref Range   Color, Urine YELLOW YELLOW   APPearance CLEAR CLEAR   Specific Gravity, Urine 1.012 1.005 - 1.030   pH 7.0 5.0 - 8.0   Glucose, UA NEGATIVE NEGATIVE mg/dL   Hgb urine dipstick NEGATIVE NEGATIVE   Bilirubin Urine NEGATIVE NEGATIVE   Ketones, ur NEGATIVE NEGATIVE mg/dL   Protein, ur NEGATIVE NEGATIVE mg/dL   Nitrite NEGATIVE NEGATIVE   Leukocytes, UA LARGE (A) NEGATIVE   RBC / HPF 0-5 0 - 5 RBC/hpf   WBC, UA 0-5 0 - 5 WBC/hpf   Bacteria, UA RARE (A) NONE SEEN   Squamous Epithelial / LPF 0-5 (A) NONE SEEN   Mucus PRESENT   CBC     Status: Abnormal   Collection Time: 01/11/18 11:41 AM  Result Value Ref Range   WBC 10.2 4.0 - 10.5 K/uL   RBC 3.77 (L) 3.87 - 5.11 MIL/uL   Hemoglobin 12.2 12.0 - 15.0 g/dL   HCT 16.1 (L) 09.6 - 04.5 %   MCV 92.8 78.0 - 100.0 fL   MCH 32.4 26.0 - 34.0 pg   MCHC 34.9 30.0 - 36.0 g/dL   RDW 40.9 81.1 - 91.4 %   Platelets 213 150 - 400 K/uL  Basic metabolic panel     Status:  Abnormal   Collection Time: 01/11/18 11:41 AM  Result Value Ref Range   Sodium 134 (L) 135 - 145 mmol/L   Potassium 3.8 3.5 - 5.1 mmol/L   Chloride 104 101 - 111 mmol/L   CO2 20 (L) 22 - 32  mmol/L   Glucose, Bld 75 65 - 99 mg/dL   BUN 7 6 - 20 mg/dL   Creatinine, Ser 1.610.36 (L) 0.44 - 1.00 mg/dL   Calcium 9.1 8.9 - 09.610.3 mg/dL   GFR calc non Af Amer >60 >60 mL/min   GFR calc Af Amer >60 >60 mL/min   Anion gap 10 5 - 15    MDM NST:  Baseline: 150 bpm, Variability: Good {> 6 bpm), Accelerations: Non-reactive but appropriate for gestational age and Decelerations: Absent IV fluid bolus x 1 liter. Phenergan 25 mg & pepcid 20 mg IV Pt able to tolerate POs & no vomiting while in MAU C/w Dr. Renaldo FiddlerAdkins. Ok to discharge home. Patient to restart bonjesta.   Assessment and Plan  A: 1. Hyperemesis gravidarum, antepartum   2. [redacted] weeks gestation of pregnancy    P: Discharge home Rx phenergan Bonjesta flagged in system for prior auth, pt to call office for prior auth and rx to be completed Increase fiber & water intake Discussed reasons to return to MAU   Judeth HornErin Danielys Madry 01/11/2018, 11:08 AM

## 2018-01-11 NOTE — Discharge Instructions (Signed)
Eating Plan for Hyperemesis Gravidarum °Hyperemesis gravidarum is a severe form of morning sickness. Because this condition causes severe nausea and vomiting, it can lead to dehydration, malnutrition, and weight loss. One way to lessen the symptoms of nausea and vomiting is to follow the eating plan for hyperemesis gravidarum. It is often used along with prescribed medicines to control your symptoms. °What can I do to relieve my symptoms? °Listen to your body. Everyone is different and has different preferences. Find what works best for you. Take any of the following actions that are helpful to you: °· Eat and drink slowly. °· Eat 5-6 small meals daily instead of 3 large meals. °· Eat crackers before you get out of bed in the morning. °· Try having a snack in the middle of the night. °· Starchy foods are usually tolerated well. Examples include cereal, toast, bread, potatoes, pasta, rice, and pretzels. °· Ginger may help with nausea. Add ¼ tsp ground ginger to hot tea or choose ginger tea. °· Try drinking 100% fruit juice or an electrolyte drink. An electrolyte drink contains sodium, potassium, and chloride. °· Continue to take your prenatal vitamins as told by your health care provider. If you are having trouble taking your prenatal vitamins, talk with your health care provider about different options. °· Include at least 1 serving of protein with your meals and snacks. Protein options include meats or poultry, beans, nuts, eggs, and yogurt. Try eating a protein-rich snack before bed. Examples of these snacks include cheese and crackers or half of a peanut butter or turkey sandwich. °· Consider eliminating foods that trigger your symptoms. These may include spicy foods, coffee, high-fat foods, very sweet foods, and acidic foods. °· Try meals that have more protein combined with bland, salty, lower-fat, and dry foods, such as nuts, seeds, pretzels, crackers, and cereal. °· Talk with your healthcare provider about  starting a supplement of vitamin B6. °· Have fluids that are cold, clear, and carbonated or sour. Examples include lemonade, ginger ale, lemon-lime soda, ice water, and sparkling water. °· Try lemon or mint tea. °· Try brushing your teeth or using a mouth rinse after meals. ° °What should I avoid to reduce my symptoms? °Avoiding some of the following things may help reduce your symptoms. °· Foods with strong smells. Try eating meals in well-ventilated areas that are free of odors. °· Drinking water or other beverages with meals. Try not to drink anything during the 30 minutes before and after your meals. °· Drinking more than 1 cup of fluid at a time. Sometimes using a straw helps. °· Fried or high-fat foods, such as butter and cream sauces. °· Spicy foods. °· Skipping meals as best as you can. Nausea can be more intense on an empty stomach. If you cannot tolerate food at that time, do not force it. Try sucking on ice chips or other frozen items, and make up for missed calories later. °· Lying down within 2 hours after eating. °· Environmental triggers. These may include smoky rooms, closed spaces, rooms with strong smells, warm or humid places, overly loud and noisy rooms, and rooms with motion or flickering lights. °· Quick and sudden changes in your movement. ° °This information is not intended to replace advice given to you by your health care provider. Make sure you discuss any questions you have with your health care provider. °Document Released: 08/21/2007 Document Revised: 06/22/2016 Document Reviewed: 05/24/2016 °Elsevier Interactive Patient Education © 2018 [employer] ° °

## 2018-03-05 ENCOUNTER — Emergency Department (HOSPITAL_COMMUNITY): Payer: BLUE CROSS/BLUE SHIELD

## 2018-03-05 ENCOUNTER — Telehealth: Payer: Self-pay | Admitting: *Deleted

## 2018-03-05 ENCOUNTER — Emergency Department (HOSPITAL_COMMUNITY)
Admission: EM | Admit: 2018-03-05 | Discharge: 2018-03-05 | Disposition: A | Discharge status: Home / Self Care | Payer: BLUE CROSS/BLUE SHIELD | Attending: Emergency Medicine | Admitting: Emergency Medicine

## 2018-03-05 ENCOUNTER — Encounter (HOSPITAL_COMMUNITY): Payer: Self-pay

## 2018-03-05 ENCOUNTER — Other Ambulatory Visit: Payer: Self-pay

## 2018-03-05 DIAGNOSIS — R079 Chest pain, unspecified: Secondary | ICD-10-CM

## 2018-03-05 DIAGNOSIS — O26899 Other specified pregnancy related conditions, unspecified trimester: Secondary | ICD-10-CM | POA: Diagnosis present

## 2018-03-05 DIAGNOSIS — R0789 Other chest pain: Secondary | ICD-10-CM | POA: Diagnosis not present

## 2018-03-05 DIAGNOSIS — R0602 Shortness of breath: Secondary | ICD-10-CM | POA: Insufficient documentation

## 2018-03-05 DIAGNOSIS — Z79899 Other long term (current) drug therapy: Secondary | ICD-10-CM | POA: Diagnosis not present

## 2018-03-05 DIAGNOSIS — R0600 Dyspnea, unspecified: Secondary | ICD-10-CM

## 2018-03-05 LAB — BASIC METABOLIC PANEL
Anion gap: 12 (ref 5–15)
BUN: <5 mg/dL — ABNORMAL LOW (ref 6–20)
CO2: 20 mmol/L — ABNORMAL LOW (ref 22–32)
Calcium: 9 mg/dL (ref 8.9–10.3)
Chloride: 104 mmol/L (ref 101–111)
Creatinine, Ser: 0.43 mg/dL — ABNORMAL LOW (ref 0.44–1.00)
GFR calc Af Amer: >60 mL/min (ref >60)
GFR calc non Af Amer: >60 mL/min (ref >60)
Glucose, Bld: 89 mg/dL (ref 65–99)
Potassium: 3.7 mmol/L (ref 3.5–5.1)
Sodium: 136 mmol/L (ref 135–145)

## 2018-03-05 LAB — URINALYSIS, ROUTINE W REFLEX MICROSCOPIC
Bilirubin Urine: NEGATIVE
Glucose, UA: NEGATIVE mg/dL
Ketones, ur: NEGATIVE mg/dL
Nitrite: NEGATIVE
Protein, ur: NEGATIVE mg/dL
Specific Gravity, Urine: 1.002 — ABNORMAL LOW (ref 1.005–1.030)
pH: 8 (ref 5.0–8.0)

## 2018-03-05 LAB — TYPE AND SCREEN
ABO/RH(D): A POS
Antibody Screen: NEGATIVE

## 2018-03-05 LAB — CBC
HCT: 32.7 % — ABNORMAL LOW (ref 36.0–46.0)
Hemoglobin: 10.9 g/dL — ABNORMAL LOW (ref 12.0–15.0)
MCH: 31.3 pg (ref 26.0–34.0)
MCHC: 33.3 g/dL (ref 30.0–36.0)
MCV: 94 fL (ref 78.0–100.0)
Platelets: 225 10*3/uL (ref 150–400)
RBC: 3.48 MIL/uL — ABNORMAL LOW (ref 3.87–5.11)
RDW: 14.2 % (ref 11.5–15.5)
WBC: 10.4 10*3/uL (ref 4.0–10.5)

## 2018-03-05 LAB — ABO/RH: ABO/RH(D): A POS

## 2018-03-05 LAB — HEPATIC FUNCTION PANEL
ALT: 13 U/L — ABNORMAL LOW (ref 14–54)
AST: 22 U/L (ref 15–41)
Albumin: 2.9 g/dL — ABNORMAL LOW (ref 3.5–5.0)
Alkaline Phosphatase: 75 U/L (ref 38–126)
Bilirubin, Direct: <0.1 mg/dL — ABNORMAL LOW (ref 0.1–0.5)
Total Bilirubin: 0.4 mg/dL (ref 0.3–1.2)
Total Protein: 6.8 g/dL (ref 6.5–8.1)

## 2018-03-05 LAB — I-STAT TROPONIN, ED
Troponin i, poc: 0.01 ng/mL (ref 0.00–0.08)
Troponin i, poc: 0 ng/mL (ref 0.00–0.08)

## 2018-03-05 LAB — LIPASE, BLOOD: Lipase: 30 U/L (ref 11–51)

## 2018-03-05 LAB — BRAIN NATRIURETIC PEPTIDE: B Natriuretic Peptide: 69.9 pg/mL (ref 0.0–100.0)

## 2018-03-05 MED ORDER — LACTATED RINGERS IV BOLUS
500.0000 mL | Freq: Once | INTRAVENOUS | Status: AC
  Administered 2018-03-05: 1000 mL via INTRAVENOUS

## 2018-03-05 MED ORDER — IOPAMIDOL (ISOVUE-370) INJECTION 76%
100.0000 mL | Freq: Once | INTRAVENOUS | Status: AC | PRN
  Administered 2018-03-05: 100 mL via INTRAVENOUS

## 2018-03-05 MED ORDER — SODIUM CHLORIDE 0.9 % IV BOLUS: 500.0000 mL | Freq: Once | INTRAVENOUS | Status: DC

## 2018-03-05 MED ORDER — LACTATED RINGERS IV BOLUS
1000.0000 mL | Freq: Once | INTRAVENOUS | Status: AC
  Administered 2018-03-05: 1000 mL via INTRAVENOUS

## 2018-03-05 MED ORDER — DEXTROSE 50 % IV SOLN: 1.0000 | Freq: Once | INTRAVENOUS | Status: DC

## 2018-03-05 MED ORDER — CEPHALEXIN 500 MG PO CAPS: 500.0000 mg | ORAL_CAPSULE | Freq: Two times a day (BID) | ORAL | 0 refills | Status: AC

## 2018-03-05 MED ORDER — IOPAMIDOL (ISOVUE-370) INJECTION 76%
INTRAVENOUS | Status: AC
  Filled 2018-03-05: qty 100

## 2018-03-05 NOTE — ED Notes (Signed)
ED Provider at bedside. 

## 2018-03-05 NOTE — ED Notes (Signed)
PA spoke with [facility] RN re: recommendation to come see the pt,will continue to monitor

## 2018-03-05 NOTE — ED Notes (Signed)
Spoke with lab re: results of labs for pt

## 2018-03-05 NOTE — Progress Notes (Signed)
Paged Dr. Marcelle Overlie. His mailbox on his phone is full and I am unable to leave him a message. I call his office and his staff is unable to reach him. They say he is not there. I spoke with Toniann Fail RN at [phone]. She says pt's V/S, 02 sats, EKG, and chest x-ray are normal. Says she has obtained a FHR of 140BPm and she has no pregnancy complaints. Our [facility] response policy states that we will see pt's [redacted] weeks gestation or greater with a pregnancy complaint.

## 2018-03-05 NOTE — ED Notes (Signed)
Pt maintained O2 saturations between 99 and 100% on RA while walking

## 2018-03-05 NOTE — ED Notes (Signed)
Corrie Dandy, [facility] RN updated via phone regarding pts status, per RN she will contact the pts OB-GYN, will continue to monitor

## 2018-03-05 NOTE — Progress Notes (Signed)
Spoke with Dr. Marcelle Overlie. Pt is contracting every 3 min. FHR tracing is a category 1. Pt is rating these uc's a 3 on a pain scale of 1-10. Cervix is 1cm, thk, presenting part is a -3 station. No vaginal bleeding or leaking of fluid. Pt has had 1 liter of fluids. She has not eaten since this morning around 0900. Okay to give pt another liter of fluids and if her CT scan is normal, she can eat If the CT scan is abnormal or. If uc's cont after the pt has eaten, I will call Dr. Marcelle Overlie back.

## 2018-03-05 NOTE — Telephone Encounter (Signed)
Pt having chest pressure, sob, dizzy, has had low iron in the past. bp this morning was 80/54. 32 and a half weeks pregnant. Called ob/gyn and was told to go to ED. Husband called here because she did not want to go to ED. Advised husband she does need to go to ED. Husband states they will go ED.

## 2018-03-05 NOTE — ED Notes (Signed)
OB rapid response called and transferred to PA per RN [facility] request

## 2018-03-05 NOTE — ED Provider Notes (Signed)
Received patient at signout from PA couture.  Refer to provider note for full history and physical examination.  Briefly, patient is a 32 year old female [redacted] weeks pregnant complaining of chest pain and shortness of breath since yesterday.  Pain is exertional and she has associated lightheadedness.  Initial troponin and EKG are reassuring.  Possible UTI on UA.  Chest x-ray shows no acute cardiopulmonary abnormalities.  Rapid OB examination is in progress, OB/GYN MD was consulted and recommended nonstress test.  We are pending CTA chest to rule out PE.  Will call the patient's OB/GYN Dr. Marcelle Overlie after work-up is completed.   Physical Exam  BP (!) 109/59   Pulse 84   Temp 98.8 F (37.1 C) (Oral)   Resp 17   Ht  (1.6 m)   Wt 75.3 kg (166 lb)   SpO2 100%   BMI 29.41 kg/m   Physical Exam  Constitutional: She appears well-developed and well-nourished. No distress.  HENT:  Head: Normocephalic and atraumatic.  Eyes: Conjunctivae are normal. Right eye exhibits no discharge. Left eye exhibits no discharge.  Neck: No JVD present. No tracheal deviation present.  Cardiovascular: Normal rate.  Pulmonary/Chest: Effort normal and breath sounds normal. No accessory muscle usage. No tachypnea. No respiratory distress. She exhibits no tenderness.  Abdominal: Soft. Bowel sounds are normal. There is no tenderness.  Musculoskeletal: She exhibits no edema.       Right lower leg: Normal. She exhibits no tenderness and no edema.       Left lower leg: Normal. She exhibits no tenderness and no edema.  Neurological: She is alert.  Skin: Skin is warm and dry. No erythema.  Psychiatric: She has a normal mood and affect. Her behavior is normal.  Nursing note and vitals reviewed.   ED Course/Procedures     Procedures  MDM  CTA shows no evidence of PE or other acute cardiopulmonary abnormalities.  Spoke with Dr. Marcelle Overlie who recommends culturing urine and holding off on treatment for UTI at this time.   He states patient is stable for discharge home pending normal troponin.  Per rapid OB nurse Corrie Dandy, patient's fetal activity has improved significantly and contractions have resolved.  Nonstress test was within normal limits.  Patient will call Dr. Dennie Bible office tomorrow to set up follow-up appointment for this week.  Serial troponins are negative.  Patient is not orthostatic.  She was ambulated with SPO2 saturations between 99 and 100% on room air despite feeling short of breath.  She is resting comfortably and states that her symptoms have improved.  She is stable for discharge home with follow-up with her OB/GYN this week.  Discussed strict ED return precautions.  Patient and patient's visitor verbalized understanding of and agreement with plan and patient is stable for discharge home at this time.      Bennye Alm 03/05/18 1958    Raeford Razor, MD 03/06/18 1504

## 2018-03-05 NOTE — ED Triage Notes (Addendum)
Pt presents for evaluation of sob and chest pressure starting last night. States pain is constant. Denies abd pain, cramping, bleeding. Pt is [redacted] weeks pregnant. Pt states her iron and ferritin have been very low. Rapid response OB called.

## 2018-03-05 NOTE — ED Notes (Signed)
OB rapid response called and transferred to [employer]

## 2018-03-05 NOTE — ED Notes (Signed)
Mary, rapid OB RN states she attempted to contact patients OB-GYN but was unsuccessful. States she will speak with ED provider.

## 2018-03-05 NOTE — ED Notes (Signed)
Ok to d/c per MD (with BP)

## 2018-03-05 NOTE — Discharge Instructions (Addendum)
You will need to follow up with your OB-GYN doctor, Dr. Marcelle Overlie this week for re-evaluation. Call his office first thing tomorrow morning to set up a follow-up appointment for this week. You should return to the emergency department immediately for any chest pain, shortness of breath, lightheadedness, passing out or feeling like you need to pass out

## 2018-03-05 NOTE — ED Provider Notes (Signed)
[facility] EMERGENCY DEPARTMENT Provider Note   CSN: [id number] Arrival date & time: 03/05/18  1014     History   Chief Complaint Chief Complaint  Patient presents with  . Shortness of Breath  . Chest Pain    HPI Stacey Compton is a 32 y.o. female.  HPI   Patient is a 32 year old female G2P1A0 who presents the ED today to be evaluated for shortness of breath and chest pain that began last night.  Describes chest pain as "pressure "that radiates across the midline.  States that it is constant and is worse when she is exerting herself.  She also has exertional dyspnea, nausea, lightheadedness and diaphoresis but denies any vomiting.  Denies any abdominal pain, cramping, bleeding.  Denies any urinary symptoms or back pain. No recent URI sxs. Has not taken anything for her symptoms.   Of note states that she has had low ferritin during her pregnancy and was being evaluated to receive iron infusions.  She has not received any yet but states she is trying to get scheduled to do so.  Past Medical History:  Diagnosis Date  . Anxiety   . Depression    denies  . Endometriosis   . Hx of varicella   . IBS (irritable bowel syndrome)   . Infection    UTI  . Migraines    with aura    Patient Active Problem List   Diagnosis Date Noted  . Cephalgia 07/06/2017  . Neck pain 07/06/2017  . Gastroesophageal reflux disease without esophagitis 04/01/2016  . Anxiety and depression 04/01/2016  . Classic migraine 08/20/2015  . Shoulder tendinitis 08/08/2013  . Muscle spasm of right shoulder 08/08/2013  . Vaginal delivery 04/08/2013    Class: Status post    Past Surgical History:  Procedure Laterality Date  . LAPAROSCOPY     with chromopertubation  . OTHER SURGICAL HISTORY     dx lap for endometriosis     OB History    Gravida  2   Para  1   Term  1   Preterm      AB      Living  1     SAB      TAB      Ectopic      Multiple      Live Births    1            Home Medications    Prior to Admission medications   Medication Sig Start Date End Date Taking? Authorizing Provider  acetaminophen (TYLENOL) 325 MG tablet Take 325 mg by mouth every 6 (six) hours as needed.   Yes [provider]  ferrous sulfate 325 (65 FE) MG tablet Take 325 mg by mouth daily with breakfast.   Yes [provider]  Prenatal Vit-Fe Fumarate-FA (PRENATAL MULTIVITAMIN) TABS tablet Take 1 tablet by mouth daily at 12 noon.   Yes [provider]  albuterol (PROVENTIL HFA;VENTOLIN HFA) 108 (90 Base) MCG/ACT inhaler Inhale 2 puffs into the lungs every 6 (six) hours as needed for wheezing or shortness of breath. Patient not taking: Reported on 03/05/2018 08/24/17   Merlyn Albert, MD  cephALEXin (KEFLEX) 500 MG capsule Take 1 capsule (500 mg total) by mouth 2 (two) times daily for 7 days. 03/05/18 03/12/18  Andriel Omalley S, PA-C  citalopram (CELEXA) 20 MG tablet Take 1 tablet (20 mg total) by mouth daily. Patient not taking: Reported on 08/30/2017 05/12/17  Babs Sciara, MD  clonazePAM (KLONOPIN) 0.5 MG tablet 1/2-1 po BID prn anxiety Patient not taking: Reported on 08/30/2017 04/01/16   Campbell Riches, NP  fluticasone [facility] HFA) 44 MCG/ACT inhaler 2 puffs bid Patient not taking: Reported on 01/11/2018 08/30/17   Merlyn Albert, MD  promethazine (PHENERGAN) 25 MG tablet Take 1 tablet (25 mg total) by mouth every 6 (six) hours as needed for nausea or vomiting. Patient not taking: Reported on 03/05/2018 01/11/18   Judeth Horn, NP  rizatriptan (MAXALT) 10 MG tablet Take 1 tablet at first sign of migraine. May repeat 2 hours later. Patient not taking: Reported on 08/30/2017 06/27/17   Babs Sciara, MD  tiZANidine (ZANAFLEX) 4 MG capsule Take 1 capsule (4 mg total) by mouth 3 (three) times daily as needed for muscle spasms. Patient not taking: Reported on 08/30/2017 06/29/17   Babs Sciara, MD    Family History Family History   Problem Relation Age of Onset  . Fibromyalgia Mother   . Diabetes Mother        borderline  . Depression Father   . Diabetes Father        type 2  . Anxiety disorder Sister   . Bipolar disorder Maternal Aunt   . Cancer Maternal Grandmother        mouth and lung  . Bipolar disorder Maternal Grandfather   . Stroke Maternal Grandfather   . Heart disease Paternal Grandfather     Social History Social History   Tobacco Use  . Smoking status: Never Smoker  . Smokeless tobacco: Never Used  Substance Use Topics  . Alcohol use: No  . Drug use: No     Allergies   Hydrocodone-homatropine and Percocet [oxycodone-acetaminophen]   Review of Systems Review of Systems  Constitutional: Negative for chills and fever.  HENT: Negative for congestion, ear pain, rhinorrhea and sore throat.   Eyes: Negative for visual disturbance.  Respiratory: Positive for shortness of breath. Negative for cough.   Cardiovascular: Positive for chest pain. Negative for leg swelling.  Gastrointestinal: Negative for abdominal pain, constipation, diarrhea, nausea and vomiting.  Genitourinary: Negative for dysuria, flank pain, frequency, hematuria and urgency.  Musculoskeletal: Negative for arthralgias and back pain.  Skin: Negative for color change and rash.  Neurological: Positive for light-headedness. Negative for dizziness, seizures, syncope, numbness and headaches.  All other systems reviewed and are negative.   Physical Exam Updated Vital Signs BP (!) 109/59   Pulse 84   Temp 98.8 F (37.1 C) (Oral)   Resp 17   Ht  (1.6 m)   Wt 75.3 kg (166 lb)   SpO2 100%   BMI 29.41 kg/m   Physical Exam  Constitutional: She appears well-developed and well-nourished.  Non-toxic appearance. She does not appear ill. No distress.  HENT:  Head: Normocephalic and atraumatic.  Mouth/Throat: Oropharynx is clear and moist.  Eyes: Pupils are equal, round, and reactive to light. Conjunctivae and EOM are  normal.  No scleral icterus  Neck: Neck supple.  Cardiovascular: Normal rate, regular rhythm, normal heart sounds and intact distal pulses. Exam reveals no gallop.  No murmur heard. Pulmonary/Chest: Effort normal and breath sounds normal. No respiratory distress. She has no decreased breath sounds. She has no wheezes. She has no rhonchi. She has no rales.  No chest tenderness on exam.  Abdominal: Soft. Bowel sounds are normal. She exhibits no distension and no mass. There is no tenderness. There is no guarding.  Gravid  abdomen  Musculoskeletal: She exhibits no edema.  No calf TTP, erythema, swelling.  Neurological: She is alert.  Skin: Skin is warm and dry. Capillary refill takes less than 2 seconds.  Psychiatric: She has a normal mood and affect.  Nursing note and vitals reviewed.   ED Treatments / Results  Labs (all labs ordered are listed, but only abnormal results are displayed) Labs Reviewed  BASIC METABOLIC PANEL - Abnormal; Notable for the following components:      Result Value   CO2 20 (*)    BUN <5 (*)    Creatinine, Ser 0.43 (*)    All other components within normal limits  CBC - Abnormal; Notable for the following components:   RBC 3.48 (*)    Hemoglobin 10.9 (*)    HCT 32.7 (*)    All other components within normal limits  HEPATIC FUNCTION PANEL - Abnormal; Notable for the following components:   Albumin 2.9 (*)    ALT 13 (*)    Bilirubin, Direct <0.1 (*)    All other components within normal limits  URINALYSIS, ROUTINE W REFLEX MICROSCOPIC - Abnormal; Notable for the following components:   Color, Urine STRAW (*)    APPearance HAZY (*)    Specific Gravity, Urine 1.002 (*)    Hgb urine dipstick SMALL (*)    Leukocytes, UA LARGE (*)    Bacteria, UA RARE (*)    All other components within normal limits  URINE CULTURE  BRAIN NATRIURETIC PEPTIDE  LIPASE, BLOOD  I-STAT TROPONIN, ED  I-STAT TROPONIN, ED  TYPE AND SCREEN  ABO/RH    EKG EKG  Interpretation  Date/Time:  Monday March 05 2018 10:18:12 EDT Ventricular Rate:  80 PR Interval:  120 QRS Duration: 68 QT Interval:  396 QTC Calculation: 456 R Axis:   78 Text Interpretation:  Normal sinus rhythm with sinus arrhythmia Normal ECG No prior ECg for comparison.  No STEMI Confirmed by Theda Belfast ([id number]) on 03/05/2018 10:37:42 AM   Radiology Dg Chest 2 View  Result Date: 03/05/2018 CLINICAL DATA:  Shortness of breath, chest pain. EXAM: CHEST - 2 VIEW COMPARISON:  Radiographs of September 01, 2015. FINDINGS: The heart size and mediastinal contours are within normal limits. Both lungs are clear. No pneumothorax or pleural effusion is noted. The visualized skeletal structures are unremarkable. IMPRESSION: No active cardiopulmonary disease. Electronically Signed   By: Lupita Raider, M.D.   On: 03/05/2018 12:03    Procedures Procedures (including critical care time)  Medications Ordered in ED Medications  lactated ringers bolus 1,000 mL (1,000 mLs Intravenous New Bag/Given 03/05/18 1559)  lactated ringers bolus 500 mL (has no administration in time range)  iopamidol (ISOVUE-370) 76 % injection (has no administration in time range)     Initial Impression / Assessment and Plan / ED Course  I have reviewed the triage vital signs and the nursing notes.  Pertinent labs & imaging results that were available during my care of the patient were reviewed by me and considered in my medical decision making (see chart for details).    Pt On fetal heart rate monitor and fetal heart rate is 148.  Baby is moving on abdominal exam.  1:06 PM Spoke with Corrie Dandy from [facility] rapid response team.  She states she contacted Dr. Dennie Bible office and left a message to let him know that patient is here in the ED.  She states that since patient has no pregnancy complaints that she will not be seeing the  patient however is available for consult and will see patient if anything changes.  1:47 PM CONSULT  with Dr. Vergie Living for ob-gyn.  Discussed patient case and that the plan is to rule out PE.  He stated that either CT PE or V/Q scan is appropriate for patient however VQ scan is preferred as this gives less radiation to mom.  He recommended having rapid response OB team the patient and complete a nonstress test. Will wait for results of labs before ordering scan to see if there are any abnormalities to explain sxs.  2:05 PM Iab was contacted and states that labs that were added on have not been run.  They will run the labs now.  2:20 PM Spoke with Corrie Dandy from rapid response OB and requested that she see pt to perform NST.  She spoke with Dr. Marcelle Overlie and informed him that pt was here. He agrees with NST.   2:22pm discussed workup thus far with pt and her father. Discussed imaging options that that vq scan is preferred per on call obgyn doc. Pt consents to imaging. Pt is in no respiratory distress. Sating at 100% on RA.   2:55PM CONSULT with Dr. Marcelle Overlie who recommended calling radiology for scan recommendations. He states if scan is negative pt can be discharged.   2:58 PM CONSULT with Dr. Reche Dixon from radiology who recommended calling Dr. Vernard Gambles, nuclear medicine, for further recommendations.   Attempted calling Dr. Thayer Headings multiple times and am unable to get ahold of him. Will call to speak to another radiologist.   3:22 PM CONSULT with Dr. Aundria Mems, from radiology, who recommended CTA chest with shield.   Discussed CTA with pt and she consents to testing. Rapid response OB nurse in the room with pt and completing NST. States pt is having small contractions. Cervix not dilated. Pt is in no distress. She had no abd pain currently. OB Nurse has tried contacting Dr. Marcelle Overlie. She will continue to monitor pt.   Final Clinical Impressions(s) / ED Diagnoses   Final diagnoses:  Urinary tract infection with hematuria, site unspecified  Chest pain, unspecified type  Dyspnea, unspecified type   32 year old  female presenting with chest pain and shortness of breath.  Denies any abdominal, vaginal or urinary sxs initially. VSS.   Cardiopulmonary and abdominal exams are benign. labwork significant for mildly low CO2 to 20. Normal renal and hepatic function. CBC with no leukocytosis, mild anemia to 10.9. BNP and lipase negative. Initial trop negative. Second trop pending.   UA with leukocytes, pt asymptomatic however will require tx for UTI. Culture sent.   CXR negative for PNA, PTX, or widened mediastinum. ECG with NSR HR 80. No ischemic changes. No s1q3t3. Normal ECG.   Given patients presentation, most concerned for PE. CTA is currently ordered and is pending at this time. Pt is stable and at shift change, pt care was signed out to [facility], PA-C who will assume care of the patient. Plan is to follow up on CTA, delta trop, and results of the NST. If all are negative then pt is likely safe for discharge with tx of UTI. She will make appropriate disposition decision based on results and contact Dr. Marcelle Overlie to update him on pt status.  ED Discharge Orders        Ordered    cephALEXin (KEFLEX) 500 MG capsule  2 times daily     03/05/18 1618       Antoniette Peake S, PA-C 03/05/18 1623  Tegeler, Canary Brim, MD 03/05/18 2022

## 2018-03-05 NOTE — Progress Notes (Addendum)
Dr. Juleen China in to discuss CT scan. Pt agreeable. No questions.Okay for pt to eat now.

## 2018-03-05 NOTE — Progress Notes (Signed)
Spoke with Dr. Marcelle Overlie. Pt is having occasional uc's. FHR is a category 1 tracing. Pt is OB cleared. She is to call the office in the morning so that she can be seen this week. The PA has spoken with Dr. Marcelle Overlie concerning labs and all test.

## 2018-03-05 NOTE — Progress Notes (Signed)
Called Dr. Dennie Bible phone. Went to [employer] and his mailbox is full. Unable to leave a message. Courtney PA in. Says she spoke with Dr. Marcelle Overlie and radiology. Pt is to have CT scan.

## 2018-03-05 NOTE — Progress Notes (Signed)
Spoke with [place], [place]. Says pt is in no distress. Has no pregnancy complaints and she will call me back if anything changes. Our policy is to see pt's 23 weeks or greater with a pregnancy related complaint. I have been unable to reach Dr. Marcelle Overlie to ask him if he wants me to see the pt.

## 2018-03-05 NOTE — Telephone Encounter (Signed)
I agree with this assessment-ER evaluation recommended

## 2018-03-05 NOTE — ED Notes (Addendum)
Spoke with Rapid Response [facility], advised to call if any abnormalities to vitals or labs.

## 2018-03-05 NOTE — ED Notes (Signed)
Patient placed on fetal heart monitor.

## 2018-03-05 NOTE — ED Notes (Signed)
Patient transported to CT 

## 2018-03-05 NOTE — Progress Notes (Signed)
Spoke with Dr. Marcelle Overlie.Pt was sent over from his office this morning to [facility] with c/o chest pain and shortness of breath. Our [facility] policy has changed in that we see pt's 23 weeks and greater with pregnancy related complaints. This pt has no pregnancy related complaints. She has normal V/S, EKG, 02 sats, chest x-ray, labs They reported a FHRof 140 BPM.  I told the Ed staff that if anything changed, They could call me back and I would come and monitor the pt. Dr. Marcelle Overlie does want me to see the pt and do a NST.

## 2018-03-08 LAB — URINE CULTURE: Culture: >=100000 — AB

## 2018-03-09 ENCOUNTER — Other Ambulatory Visit (HOSPITAL_COMMUNITY): Payer: Self-pay | Admitting: *Deleted

## 2018-03-09 ENCOUNTER — Telehealth: Payer: Self-pay | Admitting: *Deleted

## 2018-03-09 NOTE — Telephone Encounter (Signed)
Post ED Visit - Positive Culture Follow-up  Culture report reviewed by antimicrobial stewardship pharmacist:   Enzo Bi, Pharm.D.  Celedonio Miyamoto, Pharm.D., BCPS AQ-ID  Garvin Fila, Pharm.D., BCPS  [place]ina Pillion, Pharm.D., BCPS  [place], [address].D., BCPS, AAHIVP  Estella Husk, Pharm.D., BCPS, AAHIVP  Lysle Pearl, PharmD, BCPS  Sherlynn Carbon, PharmD  Pollyann Samples, PharmD, BCPS  Positive urine culture Treated with Cephalexin, organism sensitive to the same and no further patient follow-up is required at this time.  Virl Axe [facility] 03/09/2018, 12:10 PM

## 2018-03-12 ENCOUNTER — Ambulatory Visit (HOSPITAL_COMMUNITY)
Admission: RE | Admit: 2018-03-12 | Discharge: 2018-03-12 | Disposition: A | Discharge status: Home / Self Care | Payer: BLUE CROSS/BLUE SHIELD | Source: Ambulatory Visit | Attending: Obstetrics and Gynecology | Admitting: Obstetrics and Gynecology

## 2018-03-12 DIAGNOSIS — O9989 Other specified diseases and conditions complicating pregnancy, childbirth and the puerperium: Secondary | ICD-10-CM | POA: Insufficient documentation

## 2018-03-12 DIAGNOSIS — D509 Iron deficiency anemia, unspecified: Secondary | ICD-10-CM | POA: Insufficient documentation

## 2018-03-12 MED ORDER — FERUMOXYTOL INJECTION 510 MG/17 ML
510.0000 mg | INTRAVENOUS | Status: DC
  Administered 2018-03-12: 510 mg via INTRAVENOUS
  Filled 2018-03-12: qty 17

## 2018-03-15 ENCOUNTER — Ambulatory Visit (HOSPITAL_COMMUNITY)
Admission: RE | Admit: 2018-03-15 | Discharge: 2018-03-15 | Disposition: A | Discharge status: Home / Self Care | Payer: BLUE CROSS/BLUE SHIELD | Source: Ambulatory Visit | Attending: Obstetrics and Gynecology | Admitting: Obstetrics and Gynecology

## 2018-03-15 DIAGNOSIS — O99019 Anemia complicating pregnancy, unspecified trimester: Secondary | ICD-10-CM | POA: Insufficient documentation

## 2018-03-15 DIAGNOSIS — D509 Iron deficiency anemia, unspecified: Secondary | ICD-10-CM | POA: Diagnosis not present

## 2018-03-15 MED ORDER — SODIUM CHLORIDE 0.9 % IV SOLN
510.0000 mg | INTRAVENOUS | Status: DC
  Administered 2018-03-15: 510 mg via INTRAVENOUS
  Filled 2018-03-15: qty 17

## 2018-04-16 ENCOUNTER — Telehealth (HOSPITAL_COMMUNITY): Payer: Self-pay | Admitting: *Deleted

## 2018-04-16 ENCOUNTER — Encounter (HOSPITAL_COMMUNITY): Payer: Self-pay | Admitting: *Deleted

## 2018-04-16 LAB — OB RESULTS CONSOLE GBS: GBS: NEGATIVE

## 2018-04-16 NOTE — Telephone Encounter (Signed)
Preadmission screen  

## 2018-04-18 MED ORDER — ZOLPIDEM TARTRATE 5 MG PO TABS: 5.0000 mg | ORAL_TABLET | Freq: Every evening | ORAL | Status: DC | PRN

## 2018-04-18 MED ORDER — ZOLPIDEM TARTRATE 10 MG PO TABS: 10.0000 mg | ORAL_TABLET | Freq: Every evening | ORAL | Status: DC | PRN

## 2018-04-19 ENCOUNTER — Inpatient Hospital Stay (HOSPITAL_COMMUNITY): Payer: BLUE CROSS/BLUE SHIELD | Admitting: Anesthesiology

## 2018-04-19 ENCOUNTER — Inpatient Hospital Stay (HOSPITAL_COMMUNITY)
Admission: RE | Admit: 2018-04-19 | Discharge: 2018-04-20 | DRG: 807 | Disposition: A | Discharge status: Home / Self Care | Payer: BLUE CROSS/BLUE SHIELD | Attending: Obstetrics and Gynecology | Admitting: Obstetrics and Gynecology

## 2018-04-19 ENCOUNTER — Encounter (HOSPITAL_COMMUNITY): Payer: Self-pay

## 2018-04-19 DIAGNOSIS — Z3A39 39 weeks gestation of pregnancy: Secondary | ICD-10-CM | POA: Diagnosis not present

## 2018-04-19 DIAGNOSIS — Z3483 Encounter for supervision of other normal pregnancy, third trimester: Secondary | ICD-10-CM | POA: Diagnosis present

## 2018-04-19 DIAGNOSIS — Z3490 Encounter for supervision of normal pregnancy, unspecified, unspecified trimester: Secondary | ICD-10-CM | POA: Diagnosis present

## 2018-04-19 LAB — CBC
HCT: 33 % — ABNORMAL LOW (ref 36.0–46.0)
Hemoglobin: 11.2 g/dL — ABNORMAL LOW (ref 12.0–15.0)
MCH: 32.7 pg (ref 26.0–34.0)
MCHC: 33.9 g/dL (ref 30.0–36.0)
MCV: 96.5 fL (ref 78.0–100.0)
Platelets: 196 10*3/uL (ref 150–400)
RBC: 3.42 MIL/uL — ABNORMAL LOW (ref 3.87–5.11)
RDW: 16.2 % — ABNORMAL HIGH (ref 11.5–15.5)
WBC: 9.9 10*3/uL (ref 4.0–10.5)

## 2018-04-19 LAB — TYPE AND SCREEN
ABO/RH(D): A POS
Antibody Screen: NEGATIVE

## 2018-04-19 MED ORDER — EPHEDRINE 5 MG/ML INJ: 10.0000 mg | INTRAVENOUS | Status: DC | PRN

## 2018-04-19 MED ORDER — BUTORPHANOL TARTRATE 1 MG/ML IJ SOLN: 1.0000 mg | INTRAMUSCULAR | Status: DC | PRN

## 2018-04-19 MED ORDER — SOD CITRATE-CITRIC ACID 500-334 MG/5ML PO SOLN: 30.0000 mL | ORAL | Status: DC | PRN

## 2018-04-19 MED ORDER — BENZOCAINE-MENTHOL 20-0.5 % EX AERO
1.0000 "application " | INHALATION_SPRAY | CUTANEOUS | Status: DC | PRN
  Administered 2018-04-19: 1 via TOPICAL
  Filled 2018-04-19: qty 56

## 2018-04-19 MED ORDER — COCONUT OIL OIL: 1.0000 "application " | TOPICAL_OIL | Status: DC | PRN

## 2018-04-19 MED ORDER — OXYCODONE-ACETAMINOPHEN 5-325 MG PO TABS: 1.0000 | ORAL_TABLET | ORAL | Status: DC | PRN

## 2018-04-19 MED ORDER — WITCH HAZEL-GLYCERIN EX PADS
1.0000 "application " | MEDICATED_PAD | CUTANEOUS | Status: DC | PRN
  Administered 2018-04-19: 1 via TOPICAL

## 2018-04-19 MED ORDER — EPHEDRINE 5 MG/ML INJ
10.0000 mg | INTRAVENOUS | Status: DC | PRN
  Filled 2018-04-19: qty 2

## 2018-04-19 MED ORDER — TERBUTALINE SULFATE 1 MG/ML IJ SOLN
0.2500 mg | Freq: Once | INTRAMUSCULAR | Status: DC | PRN
  Filled 2018-04-19: qty 1

## 2018-04-19 MED ORDER — SIMETHICONE 80 MG PO CHEW: 80.0000 mg | CHEWABLE_TABLET | ORAL | Status: DC | PRN

## 2018-04-19 MED ORDER — TERBUTALINE SULFATE 1 MG/ML IJ SOLN
0.2500 mg | Freq: Once | INTRAMUSCULAR | Status: DC | PRN
Start: 2018-04-19 — End: 2018-04-19
  Filled 2018-04-19: qty 1

## 2018-04-19 MED ORDER — OXYTOCIN 40 UNITS IN LACTATED RINGERS INFUSION - SIMPLE MED
2.5000 [IU]/h | INTRAVENOUS | Status: DC
  Filled 2018-04-19: qty 1000

## 2018-04-19 MED ORDER — LACTATED RINGERS IV SOLN
INTRAVENOUS | Status: DC
  Administered 2018-04-19: 10:00:00 via INTRAVENOUS

## 2018-04-19 MED ORDER — IBUPROFEN 600 MG PO TABS
600.0000 mg | ORAL_TABLET | Freq: Four times a day (QID) | ORAL | Status: DC
Start: 2018-04-19 — End: 2018-04-20
  Administered 2018-04-19 – 2018-04-20 (×4): 600 mg via ORAL
  Filled 2018-04-19 (×4): qty 1

## 2018-04-19 MED ORDER — PHENYLEPHRINE 40 MCG/ML (10ML) SYRINGE FOR IV PUSH (FOR BLOOD PRESSURE SUPPORT)
80.0000 ug | PREFILLED_SYRINGE | INTRAVENOUS | Status: DC | PRN
  Filled 2018-04-19: qty 10
  Filled 2018-04-19: qty 5

## 2018-04-19 MED ORDER — OXYCODONE-ACETAMINOPHEN 5-325 MG PO TABS: 2.0000 | ORAL_TABLET | ORAL | Status: DC | PRN

## 2018-04-19 MED ORDER — MEASLES, MUMPS & RUBELLA VAC ~~LOC~~ INJ: 0.5000 mL | INJECTION | Freq: Once | SUBCUTANEOUS | Status: DC

## 2018-04-19 MED ORDER — LACTATED RINGERS IV SOLN: 500.0000 mL | INTRAVENOUS | Status: DC | PRN

## 2018-04-19 MED ORDER — OXYTOCIN 40 UNITS IN LACTATED RINGERS INFUSION - SIMPLE MED
1.0000 m[IU]/min | INTRAVENOUS | Status: DC
  Administered 2018-04-19: 2 m[IU]/min via INTRAVENOUS

## 2018-04-19 MED ORDER — MEDROXYPROGESTERONE ACETATE 150 MG/ML IM SUSP: 150.0000 mg | INTRAMUSCULAR | Status: DC | PRN

## 2018-04-19 MED ORDER — ONDANSETRON HCL 4 MG/2ML IJ SOLN: 4.0000 mg | Freq: Four times a day (QID) | INTRAMUSCULAR | Status: DC | PRN

## 2018-04-19 MED ORDER — LACTATED RINGERS IV SOLN: 500.0000 mL | Freq: Once | INTRAVENOUS | Status: DC

## 2018-04-19 MED ORDER — ACETAMINOPHEN 325 MG PO TABS
650.0000 mg | ORAL_TABLET | ORAL | Status: DC | PRN
  Administered 2018-04-20: 650 mg via ORAL
  Filled 2018-04-19: qty 2

## 2018-04-19 MED ORDER — DIPHENHYDRAMINE HCL 50 MG/ML IJ SOLN: 12.5000 mg | INTRAMUSCULAR | Status: DC | PRN

## 2018-04-19 MED ORDER — ONDANSETRON HCL 4 MG PO TABS: 4.0000 mg | ORAL_TABLET | ORAL | Status: DC | PRN

## 2018-04-19 MED ORDER — LIDOCAINE HCL (PF) 1 % IJ SOLN
INTRAMUSCULAR | Status: DC | PRN
  Administered 2018-04-19 (×2): 5 mL via EPIDURAL

## 2018-04-19 MED ORDER — ONDANSETRON HCL 4 MG/2ML IJ SOLN: 4.0000 mg | INTRAMUSCULAR | Status: DC | PRN

## 2018-04-19 MED ORDER — DIBUCAINE 1 % RE OINT: 1.0000 "application " | TOPICAL_OINTMENT | RECTAL | Status: DC | PRN

## 2018-04-19 MED ORDER — PHENYLEPHRINE 40 MCG/ML (10ML) SYRINGE FOR IV PUSH (FOR BLOOD PRESSURE SUPPORT): 80.0000 ug | PREFILLED_SYRINGE | INTRAVENOUS | Status: DC | PRN

## 2018-04-19 MED ORDER — OXYTOCIN BOLUS FROM INFUSION
500.0000 mL | Freq: Once | INTRAVENOUS | Status: AC
  Administered 2018-04-19: 500 mL via INTRAVENOUS

## 2018-04-19 MED ORDER — LIDOCAINE HCL (PF) 1 % IJ SOLN
30.0000 mL | INTRAMUSCULAR | Status: DC | PRN
  Filled 2018-04-19: qty 30

## 2018-04-19 MED ORDER — DIPHENHYDRAMINE HCL 25 MG PO CAPS: 25.0000 mg | ORAL_CAPSULE | Freq: Four times a day (QID) | ORAL | Status: DC | PRN

## 2018-04-19 MED ORDER — LACTATED RINGERS IV SOLN
500.0000 mL | Freq: Once | INTRAVENOUS | Status: AC
  Administered 2018-04-19: 500 mL via INTRAVENOUS

## 2018-04-19 MED ORDER — MISOPROSTOL 25 MCG QUARTER TABLET
25.0000 ug | ORAL_TABLET | ORAL | Status: AC
  Administered 2018-04-19 (×2): 25 ug via VAGINAL
  Filled 2018-04-19 (×3): qty 1

## 2018-04-19 MED ORDER — TETANUS-DIPHTH-ACELL PERTUSSIS 5-2.5-18.5 LF-MCG/0.5 IM SUSP: 0.5000 mL | Freq: Once | INTRAMUSCULAR | Status: DC

## 2018-04-19 MED ORDER — SENNOSIDES-DOCUSATE SODIUM 8.6-50 MG PO TABS
2.0000 | ORAL_TABLET | ORAL | Status: DC
  Administered 2018-04-19: 2 via ORAL
  Filled 2018-04-19: qty 2

## 2018-04-19 MED ORDER — ZOLPIDEM TARTRATE 5 MG PO TABS: 5.0000 mg | ORAL_TABLET | Freq: Every evening | ORAL | Status: DC | PRN

## 2018-04-19 MED ORDER — FENTANYL 2.5 MCG/ML BUPIVACAINE 1/10 % EPIDURAL INFUSION (WH - ANES)
14.0000 mL/h | INTRAMUSCULAR | Status: DC | PRN
  Administered 2018-04-19: 12 mL/h via EPIDURAL
  Administered 2018-04-19: 14 mL/h via EPIDURAL
  Filled 2018-04-19: qty 100

## 2018-04-19 MED ORDER — PRENATAL MULTIVITAMIN CH
1.0000 | ORAL_TABLET | Freq: Every day | ORAL | Status: DC
  Administered 2018-04-20: 1 via ORAL
  Filled 2018-04-19: qty 1

## 2018-04-19 MED ORDER — ACETAMINOPHEN 325 MG PO TABS: 650.0000 mg | ORAL_TABLET | ORAL | Status: DC | PRN

## 2018-04-19 MED ORDER — PHENYLEPHRINE 40 MCG/ML (10ML) SYRINGE FOR IV PUSH (FOR BLOOD PRESSURE SUPPORT)
80.0000 ug | PREFILLED_SYRINGE | INTRAVENOUS | Status: DC | PRN
  Filled 2018-04-19: qty 5

## 2018-04-19 NOTE — Anesthesia Preprocedure Evaluation (Addendum)
Anesthesia Evaluation  Patient identified by MRN, date of birth, ID band Patient awake    Reviewed: Allergy & Precautions, NPO status , Patient's Chart, lab work & pertinent test results  History of Anesthesia Complications Negative for: history of anesthetic complications  Airway Mallampati: I  TM Distance: >3 FB Neck ROM: Full    Dental  (+) Dental Advisory Given   Pulmonary neg pulmonary ROS,    breath sounds clear to auscultation       Cardiovascular negative cardio ROS   Rhythm:Regular Rate:Normal     Neuro/Psych  Headaches, Anxiety Depression    GI/Hepatic Neg liver ROS, GERD  Medicated,  Endo/Other  negative endocrine ROS  Renal/GU negative Renal ROS     Musculoskeletal   Abdominal   Peds  Hematology plt 196k   Anesthesia Other Findings   Reproductive/Obstetrics (+) Pregnancy                            Anesthesia Physical Anesthesia Plan  ASA: II  Anesthesia Plan: Epidural   Post-op Pain Management:    Induction:   PONV Risk Score and Plan: 2 and Treatment may vary due to age or medical condition  Airway Management Planned: Natural Airway  Additional Equipment:   Intra-op Plan:   Post-operative Plan:   Informed Consent: I have reviewed the patients History and Physical, chart, labs and discussed the procedure including the risks, benefits and alternatives for the proposed anesthesia with the patient or authorized representative who has indicated his/her understanding and acceptance.   Dental advisory given  Plan Discussed with:   Anesthesia Plan Comments: (Patient identified. Risks/Benefits/Options discussed with patient including but not limited to bleeding, infection, nerve damage, paralysis, failed block, incomplete pain control, headache, blood pressure changes, nausea, vomiting, reactions to medication both or allergic, itching and postpartum back pain.  Confirmed with bedside nurse the patient's most recent platelet count. Confirmed with patient that they are not currently taking any anticoagulation, have any bleeding history or any family history of bleeding disorders. Patient expressed understanding and wished to proceed. All questions were answered. )       Anesthesia Quick Evaluation

## 2018-04-19 NOTE — Anesthesia Pain Management Evaluation Note (Signed)
  [facility] Pain Management Visit Note  Patient: Stacey Compton, 32 y.o., female  "Hello I am a member of the anesthesia team at [facility]. We have an anesthesia team available at all times to provide care throughout the hospital, including epidural management and anesthesia for C-section. I don't know your plan for the delivery whether it a natural birth, water birth, IV sedation, nitrous supplementation, doula or epidural, but we want to meet your pain goals."   1.Was your pain managed to your expectations on prior hospitalizations?   Yes   2.What is your expectation for pain management during this hospitalization?     Epidural  3.How can we help you reach that goal? Epidural when ready  Record the patient's initial score and the patient's pain goal.   Pain: 6  Pain Goal: 8 [facility] wants you to be able to say your pain was always managed very well.  Edison PaceWILKERSON,Rayce Brahmbhatt 04/19/2018

## 2018-04-19 NOTE — H&P (Signed)
Stacey Compton is a 32 y.o. female presenting for IOL.  Pregnancy uncomplicated.  GBS-. OB History    Gravida  2   Para  1   Term  1   Preterm      AB      Living  1     SAB      TAB      Ectopic      Multiple      Live Births  1          Past Medical History:  Diagnosis Date  . Anxiety   . Depression    denies  . Endometriosis   . Hx of varicella   . IBS (irritable bowel syndrome)   . Infection    UTI  . Migraines    with aura   Past Surgical History:  Procedure Laterality Date  . LAPAROSCOPY     with chromopertubation  . OTHER SURGICAL HISTORY     dx lap for endometriosis   Family History: family history includes Anxiety disorder in her sister; Bipolar disorder in her maternal aunt and maternal grandfather; Cancer in her maternal grandmother; Depression in her father; Diabetes in her father and mother; Fibromyalgia in her mother; Heart disease in her paternal grandfather; Stroke in her maternal grandfather. Social History:  reports that she has never smoked. She has never used smokeless tobacco. She reports that she does not drink alcohol or use drugs.     Maternal Diabetes: No Genetic Screening: Normal Maternal Ultrasounds/Referrals: Normal Fetal Ultrasounds or other Referrals:  None Maternal Substance Abuse:  No Significant Maternal Medications:  None Significant Maternal Lab Results:  None Other Comments:  None  ROS History Dilation: 3 Effacement (%): 70 Station: -3 Exam by:: Dr. Rana SnareLowe Blood pressure 106/66, pulse 74, temperature 98.2 F (36.8 Compton), temperature source Oral, resp. rate 16, height 5\' 3"  (1.6 m), weight 176 lb (79.8 kg), unknown if currently breastfeeding. Exam Physical Exam  Prenatal labs: ABO, Rh: --/--/A POS (06/13 0045) Antibody: NEG (06/13 0045) Rubella: Immune (11/12 0000) RPR: Nonreactive (11/12 0000)  HBsAg: Negative (11/12 0000)  HIV: Non-reactive (11/12 0000)  GBS: Negative (06/10 0000)   Assessment/Plan: IUP  at term Desires IOL AROM poss Pit Anticipate SVD   Stacey Compton 04/19/2018, 9:18 AM

## 2018-04-19 NOTE — Anesthesia Procedure Notes (Signed)
Epidural Patient location during procedure: OB Start time: 04/19/2018 10:13 AM End time: 04/19/2018 10:40 AM  Staffing Anesthesiologist: Jairo BenJackson, Nolyn Swab, MD Performed: anesthesiologist   Preanesthetic Checklist Completed: patient identified, surgical consent, pre-op evaluation, timeout performed, IV checked, risks and benefits discussed and monitors and equipment checked  Epidural Patient position: sitting Prep: site prepped and draped and DuraPrep Patient monitoring: blood pressure, continuous pulse ox and heart rate Approach: midline Location: L3-L4 Injection technique: LOR air  Needle:  Needle type: Tuohy  Needle gauge: 17 G Needle length: 9 cm Needle insertion depth: 5 cm Catheter type: closed end flexible Catheter size: 19 Gauge Catheter at skin depth: 11 cm Test dose: negative (1% lidocaine)  Assessment Events: blood not aspirated, injection not painful, no injection resistance, negative IV test and no paresthesia  Additional Notes Pt identified in Labor room.  Monitors applied. Working IV access confirmed. Sterile prep, drape lumbar spine.  1% lido local L 2,3.  #17ga Touhy os, repeat local L 3,4 and LOR air at 5cm, cath in easily to 11 cm skin. Test dose OK, cath dosed and infusion begun.  Patient asymptomatic, VSS, no heme aspirated, tolerated well.  Stacey Compton, MDReason for block:procedure for pain

## 2018-04-20 ENCOUNTER — Encounter (HOSPITAL_COMMUNITY): Payer: Self-pay | Admitting: *Deleted

## 2018-04-20 LAB — CBC
HCT: 34.9 % — ABNORMAL LOW (ref 36.0–46.0)
Hemoglobin: 11.7 g/dL — ABNORMAL LOW (ref 12.0–15.0)
MCH: 32.2 pg (ref 26.0–34.0)
MCHC: 33.5 g/dL (ref 30.0–36.0)
MCV: 96.1 fL (ref 78.0–100.0)
Platelets: 171 10*3/uL (ref 150–400)
RBC: 3.63 MIL/uL — ABNORMAL LOW (ref 3.87–5.11)
RDW: 16.5 % — ABNORMAL HIGH (ref 11.5–15.5)
WBC: 11.8 10*3/uL — ABNORMAL HIGH (ref 4.0–10.5)

## 2018-04-20 LAB — RPR: RPR Ser Ql: NONREACTIVE

## 2018-04-20 LAB — BIRTH TISSUE RECOVERY COLLECTION (PLACENTA DONATION)

## 2018-04-20 MED ORDER — IBUPROFEN 600 MG PO TABS: 600.0000 mg | ORAL_TABLET | Freq: Four times a day (QID) | ORAL | 0 refills | Status: DC

## 2018-04-20 NOTE — Anesthesia Postprocedure Evaluation (Signed)
Anesthesia Post Note  Patient: Stacey Compton  Procedure(s) Performed: AN AD HOC LABOR EPIDURAL     Patient location during evaluation: Mother Baby Anesthesia Type: Epidural Level of consciousness: awake and alert and oriented Pain management: satisfactory to patient Vital Signs Assessment: post-procedure vital signs reviewed and stable Respiratory status: spontaneous breathing and nonlabored ventilation Cardiovascular status: stable Postop Assessment: no headache, no backache, no signs of nausea or vomiting, adequate PO intake, patient able to bend at knees and able to ambulate (patient up walking) Anesthetic complications: no    Last Vitals:  Vitals:   04/20/18 0129 04/20/18 0535  BP: (!) 94/56 96/61  Pulse: (!) 57 (!) 57  Resp: 16 16  Temp: 37.1 C 36.6 C  SpO2: 98% 99%    Last Pain:  Vitals:   04/20/18 0741  TempSrc:   PainSc: 5    Pain Goal:                 Stacey HickmanGREGORY,Stacey Compton

## 2018-04-20 NOTE — Discharge Instructions (Signed)
Call MD for T>100.4, heavy vaginal bleeding, severe abdominal pain, or respiratory distress.  Call office to schedule postpartum visit in 6 weeks.  Pelvic rest x 6 weeks.   

## 2018-04-20 NOTE — Lactation Note (Signed)
This note was copied from a baby's chart. Lactation Consultation Note  Patient Name: Stacey Compton Today's Date: 04/20/2018 Reason for consult: Initial assessment;Term Breastfeeding consultation services and support information given to patient.  This is her second baby and she breastfed her first baby without problems.  Mom states newborn is latching easily.  She needs to untuck lips after latch.  She is using a manual pump to pre pump the left side prior to latching.  Observed baby latch easily to right side in cross cradle hold.  Gentle chin tug needed to bring lip out.  Baby has a recessed chin.  Baby fed for 5 minutes and then fell asleep.  Reviewed skin to skin, waking techniques and breast massage.  Encouraged to call for assist/concerns prn.  Maternal Data Has patient been taught Hand Expression?: Yes Does the patient have breastfeeding experience prior to this delivery?: Yes  Feeding Feeding Type: Breast Fed Length of feed: 5 min  LATCH Score Latch: Grasps breast easily, tongue down, lips flanged, rhythmical sucking.  Audible Swallowing: A few with stimulation  Type of Nipple: Everted at rest and after stimulation  Comfort (Breast/Nipple): Soft / non-tender  Hold (Positioning): No assistance needed to correctly position infant at breast.  LATCH Score: 9  Interventions Interventions: Breast feeding basics reviewed  Lactation Tools Discussed/Used     Consult Status Consult Status: Follow-up Date: 04/21/18 Follow-up type: In-patient    Huston FoleyMOULDEN, Jontrell Bushong S 04/20/2018, 11:20 AM

## 2018-04-20 NOTE — Progress Notes (Signed)
MOB was referred for history of depression/anxiety. * Referral screened out by Clinical Social Worker because none of the following criteria appear to apply: ~ History of anxiety/depression during this pregnancy, or of post-partum depression. ~ Diagnosis of anxiety and/or depression within last 3 years OR * MOB's symptoms currently being treated with medication and/or therapy. Please contact the Clinical Social Worker if needs arise, by MOB request, or if MOB scores greater than 9/yes to question 10 on Edinburgh Postpartum Depression Screen.  Willis Holquin Boyd-Gilyard, MSW, LCSW Clinical Social Work [phone] 

## 2018-04-20 NOTE — Discharge Summary (Signed)
Obstetric Discharge Summary Reason for Admission: induction of labor Prenatal Procedures: none Intrapartum Procedures: spontaneous vaginal delivery Postpartum Procedures: none Complications-Operative and Postpartum: 2nd degree perineal laceration Hemoglobin  Date Value Ref Range Status  04/20/2018 11.7 (L) 12.0 - 15.0 g/dL Final  91/47/829507/04/2017 62.114.3 11.1 - 15.9 g/dL Final   HCT  Date Value Ref Range Status  04/20/2018 34.9 (L) 36.0 - 46.0 % Final   Hematocrit  Date Value Ref Range Status  05/12/2017 42.9 34.0 - 46.6 % Final    Physical Exam:  General: alert, cooperative and appears stated age Lochia: appropriate Uterine Fundus: firm Incision: healing well, no significant drainage, no dehiscence DVT Evaluation: No evidence of DVT seen on physical exam. Negative Homan's sign. No cords or calf tenderness.  Discharge Diagnoses: Term Pregnancy-delivered  Discharge Information: Date: 04/20/2018 Activity: pelvic rest Diet: routine Medications: PNV and Ibuprofen Condition: stable Instructions: refer to practice specific booklet Discharge to: home   Newborn Data: Live born female  Birth Weight: 7 lb 12.5 oz (3530 g) APGAR: 9, 9  Newborn Delivery   Birth date/time:  04/19/2018 14:35:00 Delivery type:  Vaginal, Spontaneous     Home with mother.  Stacey Compton 04/20/2018, 8:47 AM

## 2018-04-20 NOTE — Progress Notes (Signed)
Post Partum Day 1 Subjective: no complaints, up ad lib, voiding and tolerating PO.  Wants d/c home today and circ prior to d/c.  Objective: Blood pressure 96/61, pulse (!) 57, temperature 97.8 F (36.6 C), temperature source Oral, resp. rate 16, height 5\' 3"  (1.6 m), weight 176 lb (79.8 kg), SpO2 99 %, unknown if currently breastfeeding.  Physical Exam:  General: alert, cooperative and appears stated age Lochia: appropriate Uterine Fundus: firm Incision: healing well, no significant drainage, no significant erythema DVT Evaluation: No evidence of DVT seen on physical exam. Negative Homan's sign. No cords or calf tenderness.  Recent Labs    04/19/18 0045 04/20/18 0521  HGB 11.2* 11.7*  HCT 33.0* 34.9*    Assessment/Plan: Discharge home, Breastfeeding and Circumcision prior to discharge Counseled re: circ including risk of bleeding, infection, and scarring.  All questions were answered and the patient wishes to proceed.   LOS: 1 day   Georganna Maxson 04/20/2018, 8:43 AM

## 2018-08-08 ENCOUNTER — Ambulatory Visit: Payer: BLUE CROSS/BLUE SHIELD | Admitting: Family Medicine

## 2018-08-08 VITALS — BP 110/72 | Ht 63.0 in | Wt 151.4 lb

## 2018-08-08 DIAGNOSIS — M25552 Pain in left hip: Secondary | ICD-10-CM

## 2018-08-08 DIAGNOSIS — M7062 Trochanteric bursitis, left hip: Secondary | ICD-10-CM

## 2018-08-08 NOTE — Progress Notes (Signed)
   Subjective:    Patient ID: Stacey Compton, female    DOB: 11-12-1985, 32 y.o.   MRN: [id number]  HPI Patient arrives with left hip pain for 2 weeks. Patient states her hip feels like it  Locks up at times and has pain down the leg- no known injury.  Yrs ago ho of hip pain, had bursitis  Went thru pt, occurred while dancing then  Calmed down til recrently   Locking sensation   Painful and tender , massive pain  Painful at night   No hx of rcenr tovruse  Pos br feeding        Review of Systems No headache, no major weight loss or weight gain, no chest pain no back pain abdominal pain no change in bowel habits complete ROS otherwise negative     Objective:   Physical Exam Alert vitals stable, NAD. Blood pressure good on repeat. HEENT normal. Lungs clear. Heart regular rate and rhythm. Impressive lateral left hip tenderness.  Negative straight leg raise.  Patient was prepped draped injected with 1 cc Depo-Medrol 2 cc Xylocaine.  A sterile fashion.  Over the trochanteric bursa  Impression trochanteric bursitis.  Injection given because of breast-feeding status.  Range of motion exercises encouraged to expect slow resolution       Assessment & Plan:

## 2018-08-12 MED ORDER — METHYLPREDNISOLONE ACETATE 40 MG/ML IJ SUSP: 40.0000 mg | Freq: Once | INTRAMUSCULAR | Status: DC

## 2018-12-11 ENCOUNTER — Ambulatory Visit: Payer: BLUE CROSS/BLUE SHIELD | Admitting: Family Medicine

## 2018-12-11 ENCOUNTER — Encounter: Payer: Self-pay | Admitting: Family Medicine

## 2018-12-11 VITALS — BP 116/72 | Temp 98.3°F | Ht 63.0 in | Wt 144.4 lb

## 2018-12-11 DIAGNOSIS — J029 Acute pharyngitis, unspecified: Secondary | ICD-10-CM

## 2018-12-11 DIAGNOSIS — J019 Acute sinusitis, unspecified: Secondary | ICD-10-CM | POA: Diagnosis not present

## 2018-12-11 LAB — POCT RAPID STREP A (OFFICE): Rapid Strep A Screen: NEGATIVE

## 2018-12-11 MED ORDER — AMOXICILLIN 500 MG PO CAPS: 500.0000 mg | ORAL_CAPSULE | Freq: Three times a day (TID) | ORAL | 0 refills | Status: AC

## 2018-12-11 NOTE — Progress Notes (Signed)
   Subjective:    Patient ID: Stacey Compton, female    DOB: 1986/08/29, 33 y.o.   MRN: [id number]  Sinusitis  This is a new problem. Episode onset: 3 days. Associated symptoms include chills, congestion, coughing, headaches and a sore throat. (Fever, body aches) Past treatments include acetaminophen.   Pt is breast feeding.   Reports had fever early last week for 2 days and felt bad the rest of the week, but no other symptoms. 4 days ago started with congestion, drainage, low grade fever, aches and chills. Had a couple episodes of vomiting yesterday. Sore throat worse yesterday. Cough started yesterday as well.   Review of Systems  Constitutional: Positive for chills.  HENT: Positive for congestion and sore throat.   Respiratory: Positive for cough.   Neurological: Positive for headaches.       Objective:   Physical Exam Vitals signs and nursing note reviewed.  Constitutional:      General: She is not in acute distress.    Appearance: She is not toxic-appearing.  HENT:     Head: Normocephalic and atraumatic.     Right Ear: Tympanic membrane normal.     Left Ear: Tympanic membrane normal.     Nose:     Right Sinus: No maxillary sinus tenderness or frontal sinus tenderness.     Left Sinus: Maxillary sinus tenderness and frontal sinus tenderness present.     Mouth/Throat:     Mouth: Mucous membranes are moist.     Pharynx: Posterior oropharyngeal erythema present. No oropharyngeal exudate.  Eyes:     General:        Right eye: No discharge.        Left eye: No discharge.  Neck:     Musculoskeletal: Neck supple. No neck rigidity.  Cardiovascular:     Rate and Rhythm: Normal rate and regular rhythm.     Heart sounds: Normal heart sounds.  Pulmonary:     Effort: Pulmonary effort is normal. No respiratory distress.     Breath sounds: Normal breath sounds.  Lymphadenopathy:     Cervical: No cervical adenopathy.  Skin:    General: Skin is warm and dry.  Neurological:   Mental Status: She is alert and oriented to person, place, and time.  Psychiatric:        Mood and Affect: Mood normal.       Results for orders placed or performed in visit on 12/11/18  POCT rapid strep A  Result Value Ref Range   Rapid Strep A Screen Negative Negative       Assessment & Plan:  Acute rhinosinusitis  Sore throat - Plan: Strep A DNA probe, POCT rapid strep A, CANCELED: POCT urinalysis dipstick  Rapid strep test is negative, will send back up. Discussed with patient the high likelihood that this is still a viral infection. Symptomatic care discussed. Given that she is having some sinus tenderness discussed the possibility of a secondary bacterial infection, offered abx or to wait and see how she feels in a couple days, pt prefers to start abx. Amoxicillin prescribed as this is generally considered safe in breastfeeding women. Warning signs discussed. F/u if symptoms worsen or fail to improve.   Dr. Lilyan Punt was consulted on this case and is in agreement with the above treatment plan.

## 2018-12-12 LAB — SPECIMEN STATUS REPORT

## 2018-12-12 LAB — STREP A DNA PROBE: Strep Gp A Direct, DNA Probe: NEGATIVE

## 2019-06-11 IMAGING — DX DG CHEST 2V
2 series · 2 of 2 positions shown · non-contrast
Comparison: Radiographs September 01, 2015.

CLINICAL DATA: Shortness of breath, chest pain.

EXAM:
CHEST - 2 VIEW

[chest pa]
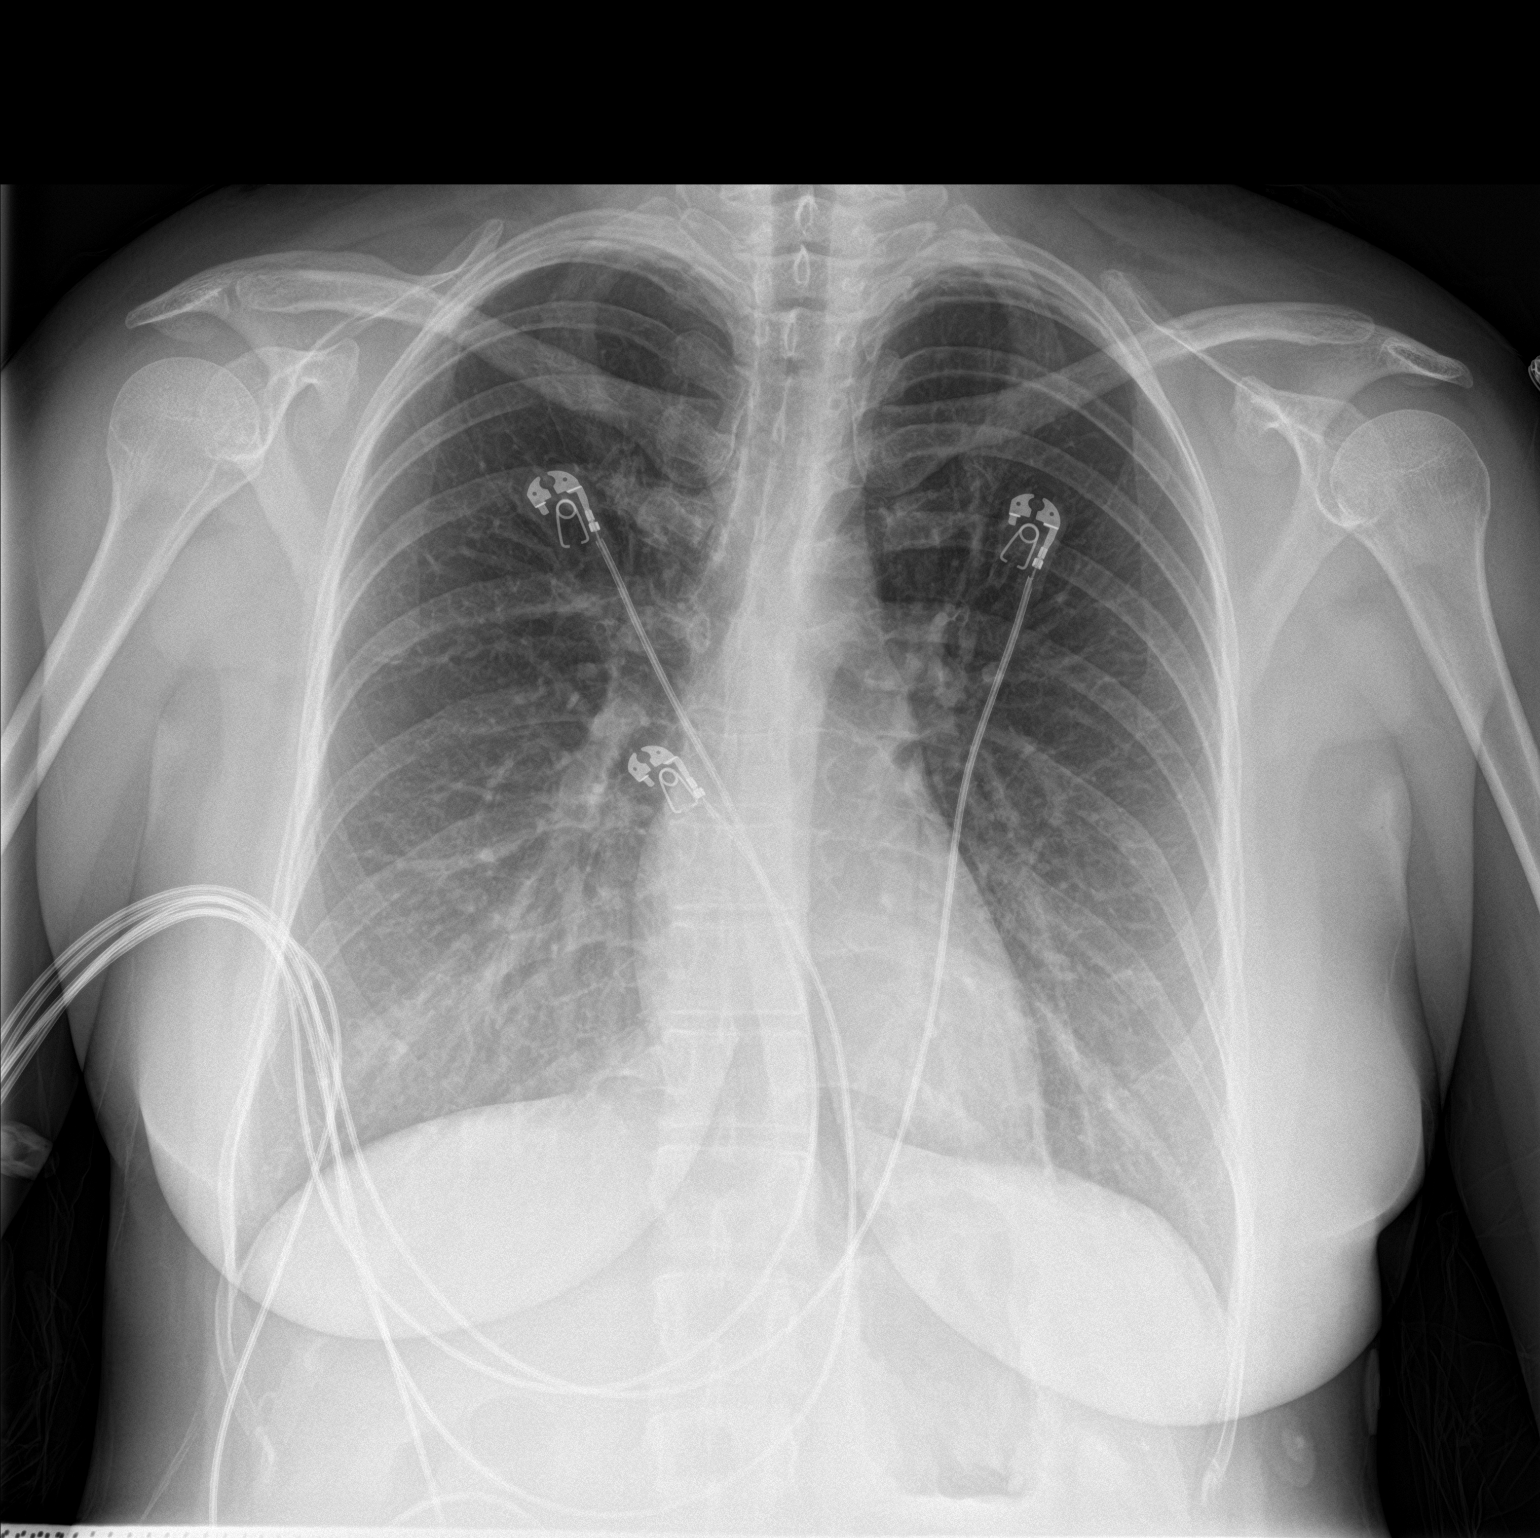

[chest lat]
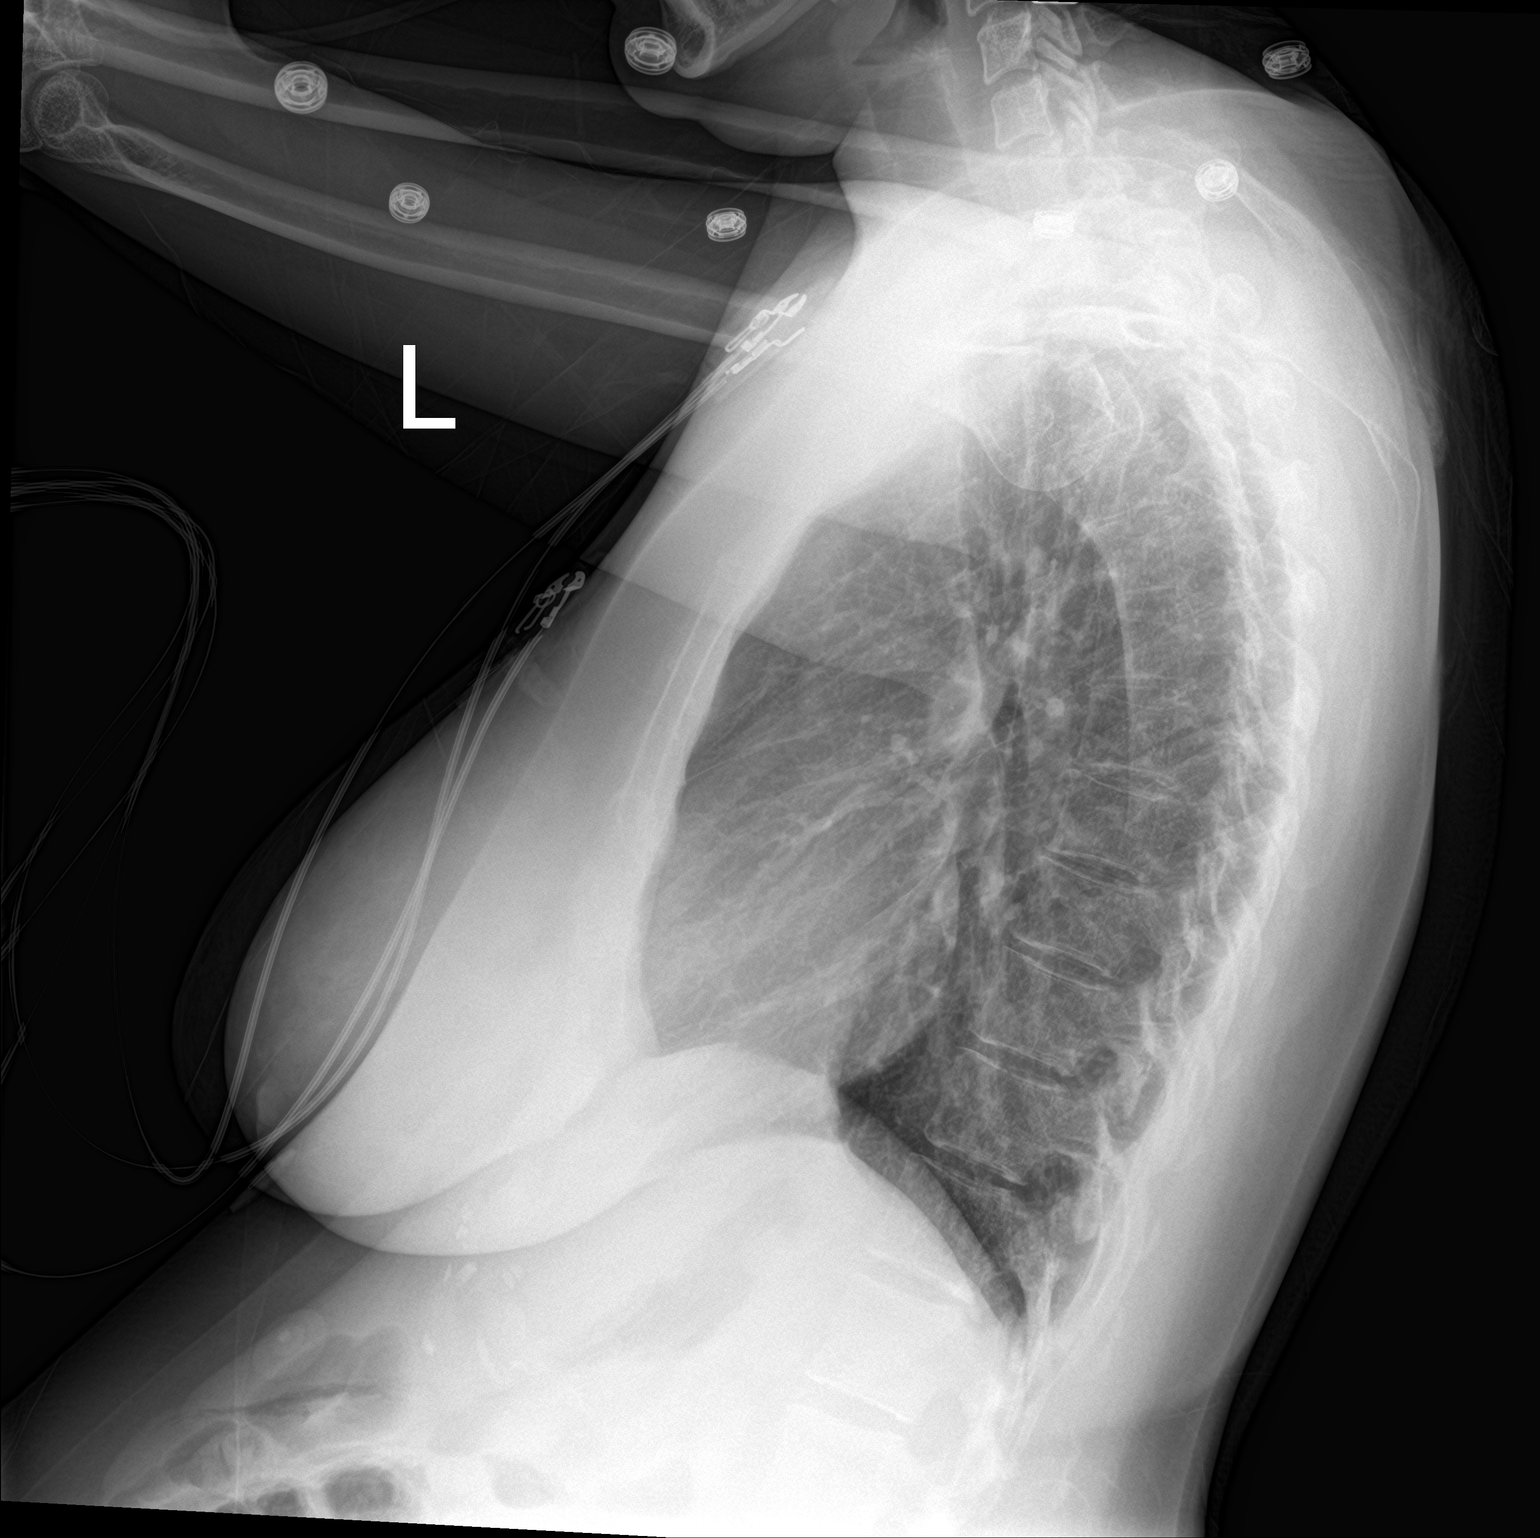

[2 of 2 positions shown; findings below may reference images not displayed]

FINDINGS: The heart size and mediastinal contours are within normal limits.
Both lungs are clear. No pneumothorax or pleural effusion is noted.
The visualized skeletal structures are unremarkable.
IMPRESSION: No active cardiopulmonary disease.

## 2019-07-19 ENCOUNTER — Other Ambulatory Visit: Payer: Self-pay

## 2019-07-19 ENCOUNTER — Ambulatory Visit (INDEPENDENT_AMBULATORY_CARE_PROVIDER_SITE_OTHER): Payer: BC Managed Care – PPO | Admitting: Nurse Practitioner

## 2019-07-19 DIAGNOSIS — G43109 Migraine with aura, not intractable, without status migrainosus: Secondary | ICD-10-CM

## 2019-07-19 MED ORDER — RIZATRIPTAN BENZOATE 10 MG PO TBDP: 10.0000 mg | ORAL_TABLET | ORAL | 0 refills | Status: DC | PRN

## 2019-07-19 NOTE — Progress Notes (Signed)
  VIRTUAL VISIT Subjective:    Patient ID: Stacey Compton, female    DOB: [birth year], 33 y.o.   MRN: [id number]  HPI Migraines. Was on rizatriptan but had to stop while she is breast feeding. Patient having a migraine lasting a week. Associated symptoms include nausea and vomiting. Has some numbness and tingling of hands. States the fatigue worse than pain. States yesterday migraine radiated to neck. Patient sensitive to light and sound. Has tried ice, heat, and ibuprofen with limited success. Has not had a severe migraine since 2018. Treated with rizatriptan and received trigger injection by neurology at that time. Has prolonged migraine about every 2 years. Has not identified any specific triggers.   Virtual Visit via Video Note  I connected with Stacey Compton on 07/19/19 at  2:40 PM EDT by a video enabled telemedicine application and verified that I am speaking with the correct person using two identifiers.  Location: Patient: home Provider: office   I discussed the limitations of evaluation and management by telemedicine and the availability of in person appointments. The patient expressed understanding and agreed to proceed.  History of Present Illness:    Observations/Objective: General: Alert and oriented. Rubs her forehead frequently.  Format  Patient present at home Provider present at office Consent for interaction obtained Coronavirus outbreak made virtual visit necessary   Assessment and Plan: Meds ordered this encounter  Medications  . rizatriptan (MAXALT-MLT) 10 MG disintegrating tablet    Sig: Take 1 tablet (10 mg total) by mouth as needed for migraine. May repeat in 2 hours if needed; Max 2 pills in 24 hours    Dispense:  10 tablet    Refill:  0    Order Specific Question:   Supervising Provider    Answer:   [place], [place]    - Patient requiring abortive therapy for migraine. Currently breast-feeding, however voluntarily willing to stop breast-feeding  to take rizatriptan medication for the weekend. Called patient and gave her specific instruction for medication. Instructed to dump breast milk this weekend. Provided education about taking no more than 2 tablets of rizatriptan in the 24 hours. Also, pt educated to wait 24 hours after last dose of rizatriptan before breast feeding again. Patient able to repeat instructions, revealing understanding. Patient currently on norethindrone daily for birth control with no missed pills. If symptoms unrelieved or worsening, please call office on Monday. Go to ED over the weekend if worse.

## 2019-07-19 NOTE — Progress Notes (Signed)
   Subjective:    Patient ID: Stacey Compton, female    DOB: [birth year], 33 y.o.   MRN: [id number]  HPI Migraines. Was rizatriptan but had to stop while she is breast feeding. Tried ibuprofen and it does not help.   Virtual Visit via Video Note  I connected with Asma R Rail on 07/19/19 at  2:40 PM EDT by a video enabled telemedicine application and verified that I am speaking with the correct person using two identifiers.  Location: Patient: home Provider: office   I discussed the limitations of evaluation and management by telemedicine and the availability of in person appointments. The patient expressed understanding and agreed to proceed.  History of Present Illness:    Observations/Objective:   Assessment and Plan:   Follow Up Instructions:    I discussed the assessment and treatment plan with the patient. The patient was provided an opportunity to ask questions and all were answered. The patient agreed with the plan and demonstrated an understanding of the instructions.   The patient was advised to call back or seek an in-person evaluation if the symptoms worsen or if the condition fails to improve as anticipated.       Review of Systems     Objective:   Physical Exam        Assessment & Plan:

## 2019-07-20 ENCOUNTER — Encounter: Payer: Self-pay | Admitting: Nurse Practitioner

## 2019-07-22 ENCOUNTER — Telehealth: Payer: Self-pay | Admitting: Family Medicine

## 2019-07-22 MED ORDER — ETODOLAC 400 MG PO TABS: 400.0000 mg | ORAL_TABLET | Freq: Three times a day (TID) | ORAL | 0 refills | Status: DC | PRN

## 2019-07-22 MED ORDER — TIZANIDINE HCL 4 MG PO TABS: 4.0000 mg | ORAL_TABLET | Freq: Three times a day (TID) | ORAL | 0 refills | Status: DC | PRN

## 2019-07-22 NOTE — Telephone Encounter (Signed)
So at this point we would recommend Holding on the breast-feeding Using a muscle relaxer as needed Also prescription anti-inflammatory And we can also initiate urgent consultation with neurology-especially if not improving (if she is interested) then may have to do additional injection  Zanaflex 4 mg 1 3 times daily as needed migraine home use only caution drowsiness, #21  Lodine 400 mg 1 3 times daily as needed headache, #21

## 2019-07-22 NOTE — Telephone Encounter (Signed)
Patient had virtual visit with Hoyle Sauer about her migraines and was prescribe maxalt 10 mg and was told to call back on Monday if not better. She states still has migraine and also is breastfeeding. [facility] Please advice

## 2019-07-22 NOTE — Telephone Encounter (Signed)
Patient states she is already on the wait list at neurology and has appt the first of Nov to see them(she is already a patient there) they stated they will call her if they have a cancellation and get her in sooner. Prescriptions sent electronically to pharmacy. Patient notified that she would need to hold breastfeeding while on medications. Patient verbalized understanding.

## 2019-07-23 ENCOUNTER — Telehealth: Payer: Self-pay | Admitting: Family Medicine

## 2019-07-23 NOTE — Telephone Encounter (Signed)
Left message to return call 

## 2019-07-23 NOTE — Telephone Encounter (Signed)
Patient advised per Dr Nicki Reaper that it would be ok for the next few days. Patient verbalized understanding.

## 2019-07-23 NOTE — Telephone Encounter (Signed)
That is permissible

## 2019-07-23 NOTE — Telephone Encounter (Signed)
Just to clarify she can do the Lodine, muscle relaxer and Maxalt for her migraine

## 2019-07-23 NOTE — Telephone Encounter (Signed)
Pt wonders if she should continue to take the Maxalt with the muscle relaxer we ordered yesterday  Please advise & call pt

## 2019-07-23 NOTE — Telephone Encounter (Signed)
Yes that would be okay over the course of the next few days

## 2019-07-26 ENCOUNTER — Other Ambulatory Visit: Payer: Self-pay | Admitting: Nurse Practitioner

## 2019-07-26 ENCOUNTER — Telehealth: Payer: Self-pay | Admitting: Family Medicine

## 2019-07-26 MED ORDER — ONDANSETRON 8 MG PO TBDP: 8.0000 mg | ORAL_TABLET | Freq: Three times a day (TID) | ORAL | 0 refills | Status: DC | PRN

## 2019-07-26 MED ORDER — KETOROLAC TROMETHAMINE 10 MG PO TABS: 10.0000 mg | ORAL_TABLET | Freq: Four times a day (QID) | ORAL | 0 refills | Status: DC | PRN

## 2019-07-26 NOTE — Telephone Encounter (Signed)
See note in chart

## 2019-07-26 NOTE — Progress Notes (Signed)
Phone call to patient regarding migraines. See note from 07/19/19. She called neurologist. She has not been seen in 2 years so had to go on a waiting list. Earliest appointment is early November. Headache has intensified over the past week with increased pain. Will wake her up at times. Had similar issues about 2 years ago which required trigger injections. Does not mind going to ED but wants to avoid COVID if possible. Has weaned her baby this week, no longer breastfeeding. Her insurance does not cover IM toradol so will send in po.  If no improvement, patient agrees to go to ED for evaluation.

## 2019-07-26 NOTE — Telephone Encounter (Signed)
Message for Stacey Compton- patient states migraines are worst and medication not helping. She called the urology office and she is on a waiting list . No appointment until [place]

## 2019-07-29 ENCOUNTER — Telehealth: Payer: Self-pay | Admitting: Family Medicine

## 2019-07-29 ENCOUNTER — Telehealth: Payer: Self-pay

## 2019-07-29 NOTE — Telephone Encounter (Signed)
Pt called states that she needs an sooner appt for her worsening migraines. Concerned that she may have to go to the ED. 

## 2019-07-29 NOTE — Telephone Encounter (Signed)
Pt called states that she needs an sooner appt for her worsening migraines. Concerned that she may have to go to the ED.

## 2019-07-29 NOTE — Telephone Encounter (Signed)
Per Hoyle Sauer note on 07/26/2019; the IM Toradol is not covered by insurance so she sent in po. Contacted patient and informed her that insurance does not cover IM Toradol. Pt verbalized understanding.

## 2019-07-29 NOTE — Telephone Encounter (Signed)
Noted  

## 2019-07-29 NOTE — Telephone Encounter (Signed)
Called, LVM offering sooner appt for 07/31/19 at 2:30pm with Amy L,NP. Asked her to call back to let us know if that works. She was last seen in 2018

## 2019-07-29 NOTE — Telephone Encounter (Signed)
Pt called to say that the ketorolac (TORADOL) 10 MG tablet was supposed to be ordered as an injectable  When she picked up Rx it was pills so she took it home & used this weekend but Hoyle Sauer told pt we were ordering an injectable  Please clarify & call pt     [facility]

## 2019-07-29 NOTE — Telephone Encounter (Signed)
Pt called back and agreed to be r/s to 9/23. Appt has been changed.

## 2019-07-31 ENCOUNTER — Other Ambulatory Visit: Payer: Self-pay

## 2019-07-31 ENCOUNTER — Ambulatory Visit: Payer: BC Managed Care – PPO | Admitting: Family Medicine

## 2019-07-31 ENCOUNTER — Encounter: Payer: Self-pay | Admitting: Family Medicine

## 2019-07-31 VITALS — BP 122/73 | HR 80 | Temp 98.2°F | Ht 63.0 in | Wt 135.8 lb

## 2019-07-31 DIAGNOSIS — G43109 Migraine with aura, not intractable, without status migrainosus: Secondary | ICD-10-CM

## 2019-07-31 MED ORDER — ERENUMAB-AOOE 140 MG/ML ~~LOC~~ SOAJ: 140.0000 mg | SUBCUTANEOUS | 3 refills | Status: DC

## 2019-07-31 NOTE — Progress Notes (Signed)
PATIENT: Stacey Compton DOB: 01-Nov-1986  REASON FOR VISIT: follow up HISTORY FROM: patient  Chief Complaint  Patient presents with  . Follow-up    Treatment room, Migraine f/u "worsening migraines"     HISTORY OF PRESENT ILLNESS: Today 08/01/19 [name] is a 33 y.o. female here today for follow up for migraines. She was last seen by Dr Felecia Shelling 2 years ago. She was on topiramate but stopped around that time after she became pregnant with her son. Since she has not needed anything prescription grade for management. Over the past three weeks, she has had an increase in headaches. She reports all over pain, sometimes unilateral. It is a constant pain with occasional stabbing pains. She has blurred vision, mild nausea, light sensitivity and sound sensitivity. She can not find specific trigger. She has seen PCP several times and treated with prednisone and rizatriptan. She was also started on tizanidine and tordol. Rizatriptan helps the most but isn't aborting headaches. She is not pregnant and has no plans to become pregnant. She is no longer breast feeding. She has MRI in 2014 that was normal. Headaches are similar to those in 2018.   HISTORY: (copied from Dr Garth Bigness note on 07/06/2017)  I had the pleasure seeing you patient, Stacey Compton, at [facility] for a neurologic consultation regarding her headaches.  Currently, she has a daily headache over the last 8 days.    Pain is located in the left forehead and the left occiput.   The pain started with throbbing pain on the left associated with nasuea and vomiting, photophobia and phonophobia.   Moving worsened the pain.  Laying in ned slightly helps.     Maxalt and Aleve which usually helped did not help at all (has tried several times) The next day, she woke up and when she got out of bed she was lightheaded and passed out.   She has a ringing in her left ear. She has mostly stayed in bed and tried tramadol without  benefit.   She was placed on prednisone on Monday and phenergan yesterday.   Pain is fluctuating between moderate and severe and she feels.   Topamax was started a few days ago.   She took it in the past and it helped but was poorly tolerated.    Current headache is longest one ever experienced (others would last 1-2 days).     She has a long history of episodic migraines. She often gets visual aura appear they are always associated with nausea and usually with vomiting. She always has photophobia and phonophobia. Pain is usually better if she lays down in a dark room.  At times they were rare but this year they are occurring weekly for one or two days each.   Aleve often helps.  Maxalt helps if Aleve does not.      I personally reviewed the MRI of the brain dated 10/02/2013. 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  REVIEW OF SYSTEMS: Out of a complete 14 system review of symptoms, the patient complains only of the following symptoms, headaches and all other reviewed systems are negative.   ALLERGIES: Allergies  Allergen Reactions  . Hydrocodone   . Hydrocodone-Acetaminophen   . Oxycodone-Acetaminophen   . Hydrocodone-Homatropine Nausea And Vomiting and Rash    Hycodan   . Percocet [Oxycodone-Acetaminophen] Hives and Rash    HOME MEDICATIONS: Outpatient Medications Prior to Visit  Medication Sig Dispense Refill  .  ketorolac (TORADOL) 10 MG tablet Take 1 tablet (10 mg total) by mouth every 6 (six) hours as needed. 20 tablet 0  . norethindrone (MICRONOR,CAMILA,ERRIN) 0.35 MG tablet Take 1 tablet by mouth daily.    . ondansetron (ZOFRAN-ODT) 8 MG disintegrating tablet Take 1 tablet (8 mg total) by mouth every 8 (eight) hours as needed for nausea or vomiting. 30 tablet 0  . rizatriptan (MAXALT-MLT) 10 MG disintegrating tablet Take 1 tablet (10 mg total) by mouth as needed for migraine. May repeat in 2 hours if needed; Max 2 pills in 24 hours 10 tablet 0  .  tiZANidine (ZANAFLEX) 4 MG tablet Take 1 tablet (4 mg total) by mouth 3 (three) times daily as needed (migraine headache). Home use only-caution drowsiness 21 tablet 0  . etodolac (LODINE) 400 MG tablet Take 1 tablet (400 mg total) by mouth 3 (three) times daily as needed (headache). 21 tablet 0   Facility-Administered Medications Prior to Visit  Medication Dose Route Frequency Provider Last Rate Last Dose  . methylPREDNISolone acetate (DEPO-MEDROL) injection 40 mg  40 mg Intra-Lesional Once Merlyn AlbertLuking, William S, MD        PAST MEDICAL HISTORY: Past Medical History:  Diagnosis Date  . Anxiety   . Depression    denies  . Endometriosis   . Hx of varicella   . IBS (irritable bowel syndrome)   . Infection    UTI  . Migraines    with aura    PAST SURGICAL HISTORY: Past Surgical History:  Procedure Laterality Date  . LAPAROSCOPY     with chromopertubation  . OTHER SURGICAL HISTORY     dx lap for endometriosis    FAMILY HISTORY: Family History  Problem Relation Age of Onset  . Fibromyalgia Mother   . Diabetes Mother        borderline  . Depression Father   . Diabetes Father        type 2  . Anxiety disorder Sister   . Bipolar disorder Maternal Aunt   . Cancer Maternal Grandmother        mouth and lung  . Bipolar disorder Maternal Grandfather   . Stroke Maternal Grandfather   . Heart disease Paternal Grandfather     SOCIAL HISTORY: Social History   Socioeconomic History  . Marital status: Married    Spouse name: Not on file  . Number of children: Not on file  . Years of education: Not on file  . Highest education level: Not on file  Occupational History  . Not on file  Social Needs  . Financial resource strain: Not on file  . Food insecurity    Worry: Not on file    Inability: Not on file  . Transportation needs    Medical: Not on file    Non-medical: Not on file  Tobacco Use  . Smoking status: Never Smoker  . Smokeless tobacco: Never Used  Substance and  Sexual Activity  . Alcohol use: No  . Drug use: No  . Sexual activity: Yes    Birth control/protection: None  Lifestyle  . Physical activity    Days per week: Not on file    Minutes per session: Not on file  . Stress: Not on file  Relationships  . Social Musicianconnections    Talks on phone: Not on file    Gets together: Not on file    Attends religious service: Not on file    Active member of club or organization: Not on  file    Attends meetings of clubs or organizations: Not on file    Relationship status: Not on file  . Intimate partner violence    Fear of current or ex partner: Not on file    Emotionally abused: Not on file    Physically abused: Not on file    Forced sexual activity: Not on file  Other Topics Concern  . Not on file  Social History Narrative  . Not on file      PHYSICAL EXAM  Vitals:   07/31/19 1438  BP: 122/73  Pulse: 80  Temp: 98.2 F (36.8 C)  Weight: 135 lb 12.8 oz (61.6 kg)  Height: 5\' 3"  (1.6 m)   Body mass index is 24.06 kg/m.  Generalized: Well developed, in no acute distress  Cardiology: normal rate and rhythm, no murmur noted Neurological examination  Mentation: Alert oriented to time, place, history taking. Follows all commands speech and language fluent Cranial nerve II-XII: Pupils were equal round reactive to light. Extraocular movements were full, visual field were full on confrontational test. Facial sensation and strength were normal. Uvula tongue midline. Head turning and shoulder shrug  were normal and symmetric. Motor: The motor testing reveals 5 over 5 strength of all 4 extremities. Good symmetric motor tone is noted throughout.  Sensory: Sensory testing is intact to soft touch on all 4 extremities. No evidence of extinction is noted.  Coordination: Cerebellar testing reveals good finger-nose-finger and heel-to-shin bilaterally.  Gait and station: Gait is normal.   DIAGNOSTIC DATA (LABS, IMAGING, TESTING) - I reviewed patient  records, labs, notes, testing and imaging myself where available.  No flowsheet data found.   Lab Results  Component Value Date   WBC 11.8 (H) 04/20/2018   HGB 11.7 (L) 04/20/2018   HCT 34.9 (L) 04/20/2018   MCV 96.1 04/20/2018   PLT 171 04/20/2018      Component Value Date/Time   NA 136 03/05/2018 1105   NA 136 05/12/2017 1003   K 3.7 03/05/2018 1105   CL 104 03/05/2018 1105   CO2 20 (L) 03/05/2018 1105   GLUCOSE 89 03/05/2018 1105   BUN <5 (L) 03/05/2018 1105   BUN 6 05/12/2017 1003   CREATININE 0.43 (L) 03/05/2018 1105   CALCIUM 9.0 03/05/2018 1105   PROT 6.8 03/05/2018 1105   PROT 7.8 01/19/2016 1115   ALBUMIN 2.9 (L) 03/05/2018 1105   ALBUMIN 4.5 01/19/2016 1115   AST 22 03/05/2018 1105   ALT 13 (L) 03/05/2018 1105   ALKPHOS 75 03/05/2018 1105   BILITOT 0.4 03/05/2018 1105   BILITOT 0.5 01/19/2016 1115   GFRNONAA >60 03/05/2018 1105   GFRAA >60 03/05/2018 1105   No results found for: CHOL, HDL, LDLCALC, LDLDIRECT, TRIG, CHOLHDL No results found for: GYJE5U No results found for: VITAMINB12 Lab Results  Component Value Date   TSH 1.380 05/12/2017     ASSESSMENT AND PLAN 33 y.o. year old female  has a past medical history of Anxiety, Depression, Endometriosis, varicella, IBS (irritable bowel syndrome), Infection, and Migraines. here with     ICD-10-CM   1. Migraine with aura and without status migrainosus, not intractable  G43.109     Bobie has experienced a worsening in migraines both in intensity and frequency over the past few months.  We will start Aimovig 140 mg every 30 days.  She may continue rizatriptan, Toradol and tizanidine as needed for abortive therapy.  She was advised to avoid over-the-counter analgesics on a regular basis.  Adequate hydration and healthy lifestyle advised.  Migraine journal may be beneficial to help identify triggers.  She will follow-up with me in 3 months, sooner if needed.  She verbalizes understanding and agreement with this  plan.    No orders of the defined types were placed in this encounter.    Meds ordered this encounter  Medications  . Erenumab-aooe 140 MG/ML SOAJ    Sig: Inject 140 mg into the skin every 30 (thirty) days.    Dispense:  3 pen    Refill:  3    Order Specific Question:   Supervising Provider    Answer:   Anson Fret [id number]      I spent 15 minutes with the patient. 50% of this time was spent counseling and educating patient on plan of care and medications.    Shawnie Dapper, FNP-C 08/01/2019, 9:18 AM [name] [address], [address] [place], [place] [address] [phone]

## 2019-07-31 NOTE — Patient Instructions (Addendum)
We will start Amovig for prevention  We will continue rizatriptan, tordol and tizanidine as needed for abortive therapy  Follow up with 3 months, sooner if needed   Migraine Headache A migraine headache is a very strong throbbing pain on one side or both sides of your head. This type of headache can also cause other symptoms. It can last from 4 hours to 3 days. Talk with your doctor about what things may bring on (trigger) this condition. What are the causes? The exact cause of this condition is not known. This condition may be triggered or caused by:  Drinking alcohol.  Smoking.  Taking medicines, such as: ? Medicine used to treat chest pain (nitroglycerin). ? Birth control pills. ? Estrogen. ? Some blood pressure medicines.  Eating or drinking certain products.  Doing physical activity. Other things that may trigger a migraine headache include:  Having a menstrual period.  Pregnancy.  Hunger.  Stress.  Not getting enough sleep or getting too much sleep.  Weather changes.  Tiredness (fatigue). What increases the risk?  Being 73-4 years old.  Being female.  Having a family history of migraine headaches.  Being Caucasian.  Having depression or anxiety.  Being very overweight. What are the signs or symptoms?  A throbbing pain. This pain may: ? Happen in any area of the head, such as on one side or both sides. ? Make it hard to do daily activities. ? Get worse with physical activity. ? Get worse around bright lights or loud noises.  Other symptoms may include: ? Feeling sick to your stomach (nauseous). ? Vomiting. ? Dizziness. ? Being sensitive to bright lights, loud noises, or smells.  Before you get a migraine headache, you may get warning signs (an aura). An aura may include: ? Seeing flashing lights or having blind spots. ? Seeing bright spots, halos, or zigzag lines. ? Having tunnel vision or blurred vision. ? Having numbness or a tingling  feeling. ? Having trouble talking. ? Having weak muscles.  Some people have symptoms after a migraine headache (postdromal phase), such as: ? Tiredness. ? Trouble thinking (concentrating). How is this treated?  Taking medicines that: ? Relieve pain. ? Relieve the feeling of being sick to your stomach. ? Prevent migraine headaches.  Treatment may also include: ? Having acupuncture. ? Avoiding foods that bring on migraine headaches. ? Learning ways to control your body functions (biofeedback). ? Therapy to help you know and deal with negative thoughts (cognitive behavioral therapy). Follow these instructions at home: Medicines  Take over-the-counter and prescription medicines only as told by your doctor.  Ask your doctor if the medicine prescribed to you: ? Requires you to avoid driving or using heavy machinery. ? Can cause trouble pooping (constipation). You may need to take these steps to prevent or treat trouble pooping:  Drink enough fluid to keep your pee (urine) pale yellow.  Take over-the-counter or prescription medicines.  Eat foods that are high in fiber. These include beans, whole grains, and fresh fruits and vegetables.  Limit foods that are high in fat and sugar. These include fried or sweet foods. Lifestyle  Do not drink alcohol.  Do not use any products that contain nicotine or tobacco, such as cigarettes, e-cigarettes, and chewing tobacco. If you need help quitting, ask your doctor.  Get at least 8 hours of sleep every night.  Limit and deal with stress. General instructions      Keep a journal to find out what may bring on  your migraine headaches. For example, write down: ? What you eat and drink. ? How much sleep you get. ? Any change in what you eat or drink. ? Any change in your medicines.  If you have a migraine headache: ? Avoid things that make your symptoms worse, such as bright lights. ? It may help to lie down in a dark, quiet room. ?  Do not drive or use heavy machinery. ? Ask your doctor what activities are safe for you.  Keep all follow-up visits as told by your doctor. This is important. Contact a doctor if:  You get a migraine headache that is different or worse than others you have had.  You have more than 15 headache days in one month. Get help right away if:  Your migraine headache gets very bad.  Your migraine headache lasts longer than 72 hours.  You have a fever.  You have a stiff neck.  You have trouble seeing.  Your muscles feel weak or like you cannot control them.  You start to lose your balance a lot.  You start to have trouble walking.  You pass out (faint).  You have a seizure. Summary  A migraine headache is a very strong throbbing pain on one side or both sides of your head. These headaches can also cause other symptoms.  This condition may be treated with medicines and changes to your lifestyle.  Keep a journal to find out what may bring on your migraine headaches.  Contact a doctor if you get a migraine headache that is different or worse than others you have had.  Contact your doctor if you have more than 15 headache days in a month. This information is not intended to replace advice given to you by your health care provider. Make sure you discuss any questions you have with your health care provider. Document Released: 08/02/2008 Document Revised: 02/15/2019 Document Reviewed: 12/06/2018 Elsevier Patient Education  2020 [employer].

## 2019-08-01 ENCOUNTER — Encounter: Payer: Self-pay | Admitting: Family Medicine

## 2019-08-01 NOTE — Progress Notes (Signed)
I have read the note, and I agree with the clinical assessment and plan.  Richard A. Sater, MD, PhD, FAAN Certified in Neurology, Clinical Neurophysiology, Sleep Medicine, Pain Medicine and Neuroimaging  [name] [address], [address] [place], [place] [address] [phone]  

## 2019-08-06 ENCOUNTER — Ambulatory Visit: Payer: Self-pay | Admitting: Family Medicine

## 2019-08-06 ENCOUNTER — Telehealth: Payer: Self-pay

## 2019-08-06 ENCOUNTER — Telehealth: Payer: Self-pay | Admitting: *Deleted

## 2019-08-06 ENCOUNTER — Encounter: Payer: Self-pay | Admitting: *Deleted

## 2019-08-06 NOTE — Telephone Encounter (Signed)
PA for Aimovig was completed via cover my meds and received instant coverage.  (Key: [id number])  This request has received a Favorable outcome from [place].  Please keep in mind this is not a guarantee of payment. Eligibility and Benefit determinations will be made at the time of service.  Please note any additional information provided by [facility] at the bottom of the screen.  Effective from 08/06/2019 through 11/03/2019.

## 2019-08-06 NOTE — Telephone Encounter (Signed)
Started PA on CMM, key: [id number], dx: 43.109, tried/failed: rizatriptan, Maxalt, topamax, Aleve, tizanidine, Toradol. Immediately approved: Effective from 08/06/2019 through 11/03/2019. Patient notified via my chart; faxed approval to [place].

## 2019-09-12 ENCOUNTER — Ambulatory Visit: Payer: BLUE CROSS/BLUE SHIELD | Admitting: Neurology

## 2019-09-23 ENCOUNTER — Other Ambulatory Visit: Payer: Self-pay

## 2019-09-23 ENCOUNTER — Encounter: Payer: Self-pay | Admitting: Family Medicine

## 2019-09-23 ENCOUNTER — Ambulatory Visit (INDEPENDENT_AMBULATORY_CARE_PROVIDER_SITE_OTHER): Payer: BC Managed Care – PPO | Admitting: Family Medicine

## 2019-09-23 DIAGNOSIS — G43109 Migraine with aura, not intractable, without status migrainosus: Secondary | ICD-10-CM

## 2019-09-23 MED ORDER — CITALOPRAM HYDROBROMIDE 20 MG PO TABS: ORAL_TABLET | ORAL | 5 refills | Status: DC

## 2019-09-23 MED ORDER — CLONAZEPAM 0.5 MG PO TABS: ORAL_TABLET | ORAL | 0 refills | Status: DC

## 2019-09-23 NOTE — Progress Notes (Signed)
   Subjective:  Audio only  Patient ID: Stacey Compton, female    DOB: [birth year], 33 y.o.   MRN: [id number]  Headache  This is a chronic problem. The problem has been gradually worsening. The quality of the pain is described as throbbing (pressure). Associated symptoms comments: Auroras, numbness/tingling in hands, nausea, vomiting. Exacerbated by: stress, anxiety  Treatments tried: Maxalt. The treatment provided mild relief.   Pt was able to see Neurology in [place]. Agreed to keep a diary of migraines and how much using med.   Virtual Visit via Video Note  I connected with [name] on 09/23/19 at  3:50 PM EST by a video enabled telemedicine application and verified that I am speaking with the correct person using two identifiers.  Location: Patient: home Provider: office   I discussed the limitations of evaluation and management by telemedicine and the availability of in person appointments. The patient expressed understanding and agreed to proceed.  History of Present Illness:    Observations/Objective:   Assessment and Plan:   Follow Up Instructions:    I discussed the assessment and treatment plan with the patient. The patient was provided an opportunity to ask questions and all were answered. The patient agreed with the plan and demonstrated an understanding of the instructions.   The patient was advised to call back or seek an in-person evaluation if the symptoms worsen or if the condition fails to improve as anticipated.  I provided 18 minutes of non-face-to-face time during this encounter.  Chart reviewed.  Neurology work-up reviewed  Patient keeping migraine headache diary now.  Notes definite increase of symptoms during periods of anxiety.  Anxiety definitely worsening.  Has stopped Celexa prepregnancy.  Also notes near panic attacks.  Clonazepam helped in the past.  Feels if these could be improved her migraines will improve Vicente Males, LPN   Review  of Systems  Neurological: Positive for headaches.       Objective:   Physical Exam  Virtual      Assessment & Plan:  Impression generalized anxiety disorder with occasional panic attack.  Resume Celexa 20 mg daily.  Klonopin to use rarely.  Exercise encouraged rationale discussed.  Maintain connection with migraine specialist.  Follow-up with Dr. Nicki Reaper in several months

## 2019-10-10 ENCOUNTER — Ambulatory Visit (INDEPENDENT_AMBULATORY_CARE_PROVIDER_SITE_OTHER): Payer: BC Managed Care – PPO | Admitting: Family Medicine

## 2019-10-10 ENCOUNTER — Other Ambulatory Visit: Payer: Self-pay

## 2019-10-10 ENCOUNTER — Encounter: Payer: Self-pay | Admitting: Family Medicine

## 2019-10-10 DIAGNOSIS — G43109 Migraine with aura, not intractable, without status migrainosus: Secondary | ICD-10-CM

## 2019-10-10 NOTE — Progress Notes (Signed)
   Subjective:  Cannot do adequate evaluation over the phone patient will come in tomorrow for evaluation  Patient ID: Stacey Compton, female    DOB: Dec 26, 1985, 33 y.o.   MRN: [id number]  Migraine  This is a recurrent problem. Episode onset: September. Associated symptoms include dizziness, nausea and numbness. Pertinent negatives include no abdominal pain, coughing, fever, rhinorrhea, vomiting or weakness. Associated symptoms comments: Aurora, faint feeling, fatigue. Treatments tried: migraine journal, neurology, Maxalt, Aimovig.  pt sees [facility] for migraines. Pt felt anxiety was main trigger. For about a week, pt has had severe migraine symptoms. Pt can go a couple of days with no symptoms but then when migraine comes on, it severe. Patient has been feeling bad over the past several days feeling fatigued tired also having headache that she describes as her migraines she denies any wheezing or difficulty breathing denies sweats chills fevers cough sore throat.  She has had progressive troubles with her headaches over the past month and the injectable medicine they have her on is not doing enough.  She also states she has had progressive fatigue and tiredness over the past several days patient with fatigue tiredness feeling rundown when she stands up and moves around. Virtual Visit via Telephone Note  I connected with [place] on 10/10/19 at 11:00 AM EST by telephone and verified that I am speaking with the correct person using two identifiers.  Location: Patient: home Provider: office    I discussed the limitations, risks, security and privacy concerns of performing an evaluation and management service by telephone and the availability of in person appointments. I also discussed with the patient that there may be a patient responsible charge related to this service. The patient expressed understanding and agreed to proceed.   History of Present Illness:     Observations/Objective:   Assessment and Plan:   Follow Up Instructions:    I discussed the assessment and treatment plan with the patient. The patient was provided an opportunity to ask questions and all were answered. The patient agreed with the plan and demonstrated an understanding of the instructions.   The patient was advised to call back or seek an in-person evaluation if the symptoms worsen or if the condition fails to improve as anticipated.  I provided 25 minutes of non-face-to-face time during this encounter.   Vicente Males, LPN   Review of Systems  Constitutional: Positive for fatigue. Negative for activity change, appetite change, chills, diaphoresis and fever.  HENT: Negative for congestion and rhinorrhea.   Respiratory: Negative for cough and shortness of breath.   Cardiovascular: Negative for chest pain and leg swelling.  Gastrointestinal: Positive for nausea. Negative for abdominal pain and vomiting.  Skin: Negative for color change.  Neurological: Positive for dizziness, light-headedness, numbness and headaches. Negative for weakness.  Psychiatric/Behavioral: Negative for agitation and confusion.       Objective:   Physical Exam        Assessment & Plan:  Will be seen in person on the fourth We will combine that history and video portion that was taken today along with the in person visit tomorrow for 1 visit only

## 2019-10-10 NOTE — Progress Notes (Deleted)
PATIENT: Stacey Compton DOB: 02/12/1986  REASON FOR VISIT: follow up HISTORY FROM: patient  No chief complaint on file.    HISTORY OF PRESENT ILLNESS: Today 10/10/19 Stacey Compton is a 33 y.o. female here today for follow up for worsening migraines. She was started on Amovig in 07/2019. She was seen by PCP on 11/16 for worsening migraines, thought to be related to anxiety and stress. She was started on citalopram  daily and clonazepam 0.25-0.5mg  BID as needed.   HISTORY: (copied from my note on 07/31/2019)  Stacey Compton is a 33 y.o. female here today for follow up for migraines. She was last seen by Dr Epimenio Foot 2 years ago. She was on topiramate but stopped around that time after she became pregnant with her son. Since she has not needed anything prescription grade for management. Over the past three weeks, she has had an increase in headaches. She reports all over pain, sometimes unilateral. It is a constant pain with occasional stabbing pains. She has blurred vision, mild nausea, light sensitivity and sound sensitivity. She can not find specific trigger. She has seen PCP several times and treated with prednisone and rizatriptan. She was also started on tizanidine and tordol. Rizatriptan helps the most but isn't aborting headaches. She is not pregnant and has no plans to become pregnant. She is no longer breast feeding. She has MRI in 2014 that was normal. Headaches are similar to those in 2018.   HISTORY: (copied from Dr Bonnita Hollow note on 07/06/2017)  I had the pleasure seeing you patient, Stacey Compton, at [facility] for a neurologic consultation regarding her headaches.  Currently, she has a daily headache over the last 8 days. Pain is located in the left forehead and the left occiput. The pain started with throbbing pain on the left associated with nasuea and vomiting, photophobia and phonophobia. Moving worsened the pain. Laying in ned slightly helps.  Maxalt and Aleve which usually helped did not help at all (has tried several times) The next day, she woke up and when she got out of bed she was lightheaded and passed out. She has a ringing in her left ear. She has mostly stayed in bed and tried tramadol without benefit. She was placed on prednisone on Monday and phenergan yesterday. Pain is fluctuating between moderate and severe and she feels. Topamax was started a few days ago. She took it in the past and it helped but was poorly tolerated. Current headache is longest one ever experienced (others would last 1-2 days).   She has a long history of episodic migraines.She often gets visual aura appear they are always associated with nausea and usually with vomiting. She always has photophobia and phonophobia. Pain is usually better if she lays down in a dark room.At times they were rare but this year they are occurring weekly for one or two days each. Aleve often helps. Maxalt helps if Aleve does not.   I personally reviewed the MRI of the brain dated 10/02/2013. 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   REVIEW OF SYSTEMS: Out of a complete 14 system review of symptoms, the patient complains only of the following symptoms, and all other reviewed systems are negative.  ALLERGIES: Allergies  Allergen Reactions  . Hydrocodone   . Hydrocodone-Acetaminophen   . Oxycodone-Acetaminophen   . Hydrocodone-Homatropine Nausea And Vomiting and Rash    Hycodan   . Percocet [Oxycodone-Acetaminophen] Hives and Rash  HOME MEDICATIONS: Outpatient Medications Prior to Visit  Medication Sig Dispense Refill  . citalopram (CELEXA) 20 MG tablet Take one tablet po every morning 30 tablet 5  . clonazePAM (KLONOPIN) 0.5 MG tablet Take 1/2-1 tablet po BID prn anxiety 24 tablet 0  . Erenumab-aooe 140 MG/ML SOAJ Inject 140 mg into the skin every 30 (thirty) days. 3 pen 3  . ketorolac (TORADOL) 10 MG  tablet Take 1 tablet (10 mg total) by mouth every 6 (six) hours as needed. 20 tablet 0  . norethindrone (MICRONOR,CAMILA,ERRIN) 0.35 MG tablet Take 1 tablet by mouth daily.    . ondansetron (ZOFRAN-ODT) 8 MG disintegrating tablet Take 1 tablet (8 mg total) by mouth every 8 (eight) hours as needed for nausea or vomiting. 30 tablet 0  . rizatriptan (MAXALT-MLT) 10 MG disintegrating tablet Take 1 tablet (10 mg total) by mouth as needed for migraine. May repeat in 2 hours if needed; Max 2 pills in 24 hours 10 tablet 0  . tiZANidine (ZANAFLEX) 4 MG tablet Take 1 tablet (4 mg total) by mouth 3 (three) times daily as needed (migraine headache). Home use only-caution drowsiness 21 tablet 0   Facility-Administered Medications Prior to Visit  Medication Dose Route Frequency Provider Last Rate Last Dose  . methylPREDNISolone acetate (DEPO-MEDROL) injection 40 mg  40 mg Intra-Lesional Once Merlyn AlbertLuking, William S, MD        PAST MEDICAL HISTORY: Past Medical History:  Diagnosis Date  . Anxiety   . Depression    denies  . Endometriosis   . Hx of varicella   . IBS (irritable bowel syndrome)   . Infection    UTI  . Migraines    with aura    PAST SURGICAL HISTORY: Past Surgical History:  Procedure Laterality Date  . LAPAROSCOPY     with chromopertubation  . OTHER SURGICAL HISTORY     dx lap for endometriosis    FAMILY HISTORY: Family History  Problem Relation Age of Onset  . Fibromyalgia Mother   . Diabetes Mother        borderline  . Depression Father   . Diabetes Father        type 2  . Anxiety disorder Sister   . Bipolar disorder Maternal Aunt   . Cancer Maternal Grandmother        mouth and lung  . Bipolar disorder Maternal Grandfather   . Stroke Maternal Grandfather   . Heart disease Paternal Grandfather     SOCIAL HISTORY: Social History   Socioeconomic History  . Marital status: Married    Spouse name: Not on file  . Number of children: Not on file  . Years of education:  Not on file  . Highest education level: Not on file  Occupational History  . Not on file  Social Needs  . Financial resource strain: Not on file  . Food insecurity    Worry: Not on file    Inability: Not on file  . Transportation needs    Medical: Not on file    Non-medical: Not on file  Tobacco Use  . Smoking status: Never Smoker  . Smokeless tobacco: Never Used  Substance and Sexual Activity  . Alcohol use: No  . Drug use: No  . Sexual activity: Yes    Birth control/protection: None  Lifestyle  . Physical activity    Days per week: Not on file    Minutes per session: Not on file  . Stress: Not on file  Relationships  .  Social Musician on phone: Not on file    Gets together: Not on file    Attends religious service: Not on file    Active member of club or organization: Not on file    Attends meetings of clubs or organizations: Not on file    Relationship status: Not on file  . Intimate partner violence    Fear of current or ex partner: Not on file    Emotionally abused: Not on file    Physically abused: Not on file    Forced sexual activity: Not on file  Other Topics Concern  . Not on file  Social History Narrative  . Not on file      PHYSICAL EXAM  There were no vitals filed for this visit. There is no height or weight on file to calculate BMI.  Generalized: Well developed, in no acute distress  Cardiology: normal rate and rhythm, no murmur noted Neurological examination  Mentation: Alert oriented to time, place, history taking. Follows all commands speech and language fluent Cranial nerve II-XII: Pupils were equal round reactive to light. Extraocular movements were full, visual field were full on confrontational test. Facial sensation and strength were normal. Uvula tongue midline. Head turning and shoulder shrug  were normal and symmetric. Motor: The motor testing reveals 5 over 5 strength of all 4 extremities. Good symmetric motor tone is noted  throughout.  Sensory: Sensory testing is intact to soft touch on all 4 extremities. No evidence of extinction is noted.  Coordination: Cerebellar testing reveals good finger-nose-finger and heel-to-shin bilaterally.  Gait and station: Gait is normal. Tandem gait is normal. Romberg is negative. No drift is seen.  Reflexes: Deep tendon reflexes are symmetric and normal bilaterally.   DIAGNOSTIC DATA (LABS, IMAGING, TESTING) - I reviewed patient records, labs, notes, testing and imaging myself where available.  No flowsheet data found.   Lab Results  Component Value Date   WBC 11.8 (H) 04/20/2018   HGB 11.7 (L) 04/20/2018   HCT 34.9 (L) 04/20/2018   MCV 96.1 04/20/2018   PLT 171 04/20/2018      Component Value Date/Time   NA 136 03/05/2018 1105   NA 136 05/12/2017 1003   K 3.7 03/05/2018 1105   CL 104 03/05/2018 1105   CO2 20 (L) 03/05/2018 1105   GLUCOSE 89 03/05/2018 1105   BUN <5 (L) 03/05/2018 1105   BUN 6 05/12/2017 1003   CREATININE 0.43 (L) 03/05/2018 1105   CALCIUM 9.0 03/05/2018 1105   PROT 6.8 03/05/2018 1105   PROT 7.8 01/19/2016 1115   ALBUMIN 2.9 (L) 03/05/2018 1105   ALBUMIN 4.5 01/19/2016 1115   AST 22 03/05/2018 1105   ALT 13 (L) 03/05/2018 1105   ALKPHOS 75 03/05/2018 1105   BILITOT 0.4 03/05/2018 1105   BILITOT 0.5 01/19/2016 1115   GFRNONAA >60 03/05/2018 1105   GFRAA >60 03/05/2018 1105   No results found for: CHOL, HDL, LDLCALC, LDLDIRECT, TRIG, CHOLHDL No results found for: FUXN2T No results found for: VITAMINB12 Lab Results  Component Value Date   TSH 1.380 05/12/2017       ASSESSMENT AND PLAN 33 y.o. year old female  has a past medical history of Anxiety, Depression, Endometriosis, varicella, IBS (irritable bowel syndrome), Infection, and Migraines. here with ***  No diagnosis found.     No orders of the defined types were placed in this encounter.    No orders of the defined types were placed in this  encounter.     I spent 15  minutes with the patient. 50% of this time was spent counseling and educating patient on plan of care and medications.    Debbora Presto, FNP-C 10/10/2019, 2:56 PM [name] [address], [place], [place] [address] [phone]

## 2019-10-11 ENCOUNTER — Encounter: Payer: Self-pay | Admitting: Family Medicine

## 2019-10-11 ENCOUNTER — Other Ambulatory Visit: Payer: Self-pay

## 2019-10-11 ENCOUNTER — Ambulatory Visit (INDEPENDENT_AMBULATORY_CARE_PROVIDER_SITE_OTHER): Payer: BC Managed Care – PPO | Admitting: Family Medicine

## 2019-10-11 VITALS — BP 122/72 | Temp 96.8°F | Wt 139.2 lb

## 2019-10-11 DIAGNOSIS — R5383 Other fatigue: Secondary | ICD-10-CM | POA: Diagnosis not present

## 2019-10-11 DIAGNOSIS — R519 Headache, unspecified: Secondary | ICD-10-CM

## 2019-10-11 DIAGNOSIS — R110 Nausea: Secondary | ICD-10-CM

## 2019-10-11 MED ORDER — KETOROLAC TROMETHAMINE 10 MG PO TABS: 10.0000 mg | ORAL_TABLET | Freq: Four times a day (QID) | ORAL | 0 refills | Status: DC | PRN

## 2019-10-11 NOTE — Progress Notes (Signed)
   Subjective:    Patient ID: Stacey Compton, female    DOB: 07/23/1986, 33 y.o.   MRN: [id number]  HPI Pt here for office visit due to migraines that have become worse. Pt had virtual visit with Dr.Soctt yesterday. Pt had aimovig shot last Monday.  Please see documentation from yesterday's visit for this subjective  Patient with significant fatigue denies true muscle aches but states she feels bad all over low energy she also relates moderate headache in the frontal part of the head as well as the back of the neck she denies numbness or tingling down into her arms She does relate getting lightheaded with standing up and moving around at times  Review of Systems  Constitutional: Positive for fatigue. Negative for activity change, appetite change and fever.  HENT: Negative for congestion and rhinorrhea.   Respiratory: Negative for cough and shortness of breath.   Cardiovascular: Negative for chest pain and leg swelling.  Gastrointestinal: Positive for nausea. Negative for abdominal pain and diarrhea.  Endocrine: Negative for polydipsia and polyphagia.  Skin: Negative for color change.  Neurological: Positive for dizziness, weakness, light-headedness and headaches.  Psychiatric/Behavioral: Negative for behavioral problems and confusion.       Objective:   Physical Exam Vitals signs reviewed.  Constitutional:      General: She is not in acute distress. HENT:     Head: Normocephalic and atraumatic.  Eyes:     General:        Right eye: No discharge.        Left eye: No discharge.  Neck:     Trachea: No tracheal deviation.  Cardiovascular:     Rate and Rhythm: Normal rate and regular rhythm.     Heart sounds: Normal heart sounds. No murmur.  Pulmonary:     Effort: Pulmonary effort is normal. No respiratory distress.     Breath sounds: Normal breath sounds.  Lymphadenopathy:     Cervical: No cervical adenopathy.  Skin:    General: Skin is warm and dry.  Neurological:   Mental Status: She is alert.     Coordination: Coordination normal.  Psychiatric:        Behavior: Behavior normal.    Neck no abnormal masses felt       Assessment & Plan:  Intractable headaches I have encouraged patient to talk with her neurologist about the possibility of adding Topamax currently she is not trying to have children Other options would include Botox further discussion with neurology recommended  Because of fatigue tiredness and significant low energy will run some blood test to make sure there is not secondary issues: 25 minutes spent with patient yesterday with video as well as today person greater than half in discussion

## 2019-10-12 ENCOUNTER — Encounter: Payer: Self-pay | Admitting: Family Medicine

## 2019-10-12 LAB — CBC WITH DIFFERENTIAL/PLATELET
Basophils Absolute: 0 10*3/uL (ref 0.0–0.2)
Basos: 1 %
EOS (ABSOLUTE): 0.3 10*3/uL (ref 0.0–0.4)
Eos: 4 %
Hematocrit: 43.5 % (ref 34.0–46.6)
Hemoglobin: 14.5 g/dL (ref 11.1–15.9)
Immature Grans (Abs): 0 10*3/uL (ref 0.0–0.1)
Immature Granulocytes: 0 %
Lymphocytes Absolute: 2 10*3/uL (ref 0.7–3.1)
Lymphs: 27 %
MCH: 31.7 pg (ref 26.6–33.0)
MCHC: 33.3 g/dL (ref 31.5–35.7)
MCV: 95 fL (ref 79–97)
Monocytes Absolute: 0.3 10*3/uL (ref 0.1–0.9)
Monocytes: 4 %
Neutrophils Absolute: 4.6 10*3/uL (ref 1.4–7.0)
Neutrophils: 64 %
Platelets: 299 10*3/uL (ref 150–450)
RBC: 4.57 x10E6/uL (ref 3.77–5.28)
RDW: 12 % (ref 11.7–15.4)
WBC: 7.2 10*3/uL (ref 3.4–10.8)

## 2019-10-12 LAB — COMPREHENSIVE METABOLIC PANEL
ALT: 8 IU/L (ref 0–32)
AST: 17 IU/L (ref 0–40)
Albumin/Globulin Ratio: 1.8 (ref 1.2–2.2)
Albumin: 4.9 g/dL — ABNORMAL HIGH (ref 3.8–4.8)
Alkaline Phosphatase: 62 IU/L (ref 39–117)
BUN/Creatinine Ratio: 13 (ref 9–23)
BUN: 9 mg/dL (ref 6–20)
Bilirubin Total: 0.4 mg/dL (ref 0.0–1.2)
CO2: 24 mmol/L (ref 20–29)
Calcium: 9.4 mg/dL (ref 8.7–10.2)
Chloride: 102 mmol/L (ref 96–106)
Creatinine, Ser: 0.7 mg/dL (ref 0.57–1.00)
GFR calc Af Amer: 130 mL/min/{1.73_m2} (ref >59)
GFR calc non Af Amer: 113 mL/min/{1.73_m2} (ref >59)
Globulin, Total: 2.8 g/dL (ref 1.5–4.5)
Glucose: 70 mg/dL (ref 65–99)
Potassium: 4.8 mmol/L (ref 3.5–5.2)
Sodium: 140 mmol/L (ref 134–144)
Total Protein: 7.7 g/dL (ref 6.0–8.5)

## 2019-10-12 LAB — TSH: TSH: 1.3 u[IU]/mL (ref 0.450–4.500)

## 2019-10-12 LAB — FERRITIN: Ferritin: 260 ng/mL — ABNORMAL HIGH (ref 15–150)

## 2019-10-12 LAB — SEDIMENTATION RATE: Sed Rate: 16 mm/hr (ref 0–32)

## 2019-10-14 ENCOUNTER — Telehealth: Payer: Self-pay | Admitting: Family Medicine

## 2019-10-14 DIAGNOSIS — R7989 Other specified abnormal findings of blood chemistry: Secondary | ICD-10-CM

## 2019-10-14 NOTE — Telephone Encounter (Signed)
Blood work ordered in [employer] and orders mailed to patient.

## 2019-10-14 NOTE — Addendum Note (Signed)
Addended by: Dairl Ponder on: 10/14/2019 10:41 AM   Modules accepted: Orders

## 2019-10-14 NOTE — Telephone Encounter (Signed)
Nurses I sent the patient a MyChart message regarding her ferritin  Due to elevated ferritin please order the following Ferritin, TIBC, hemoglobin /hematocrit These labs should be completed in May Please order then send the patient the requisition for the labs thanks with the indication to do these in May 2021

## 2019-10-15 ENCOUNTER — Telehealth: Payer: Self-pay

## 2019-10-15 ENCOUNTER — Encounter

## 2019-10-15 ENCOUNTER — Ambulatory Visit: Payer: Self-pay | Admitting: Family Medicine

## 2019-10-15 NOTE — Telephone Encounter (Signed)
LVM asking the patient to call our office to r/s her appt due to Debbora Presto, NP being out of office today. Office number was provided.

## 2019-10-17 ENCOUNTER — Encounter: Payer: Self-pay | Admitting: Adult Health

## 2019-10-17 ENCOUNTER — Other Ambulatory Visit: Payer: Self-pay

## 2019-10-17 ENCOUNTER — Ambulatory Visit: Payer: BC Managed Care – PPO | Admitting: Adult Health

## 2019-10-17 VITALS — BP 120/88 | HR 99 | Temp 97.8°F | Ht 63.0 in | Wt 138.6 lb

## 2019-10-17 DIAGNOSIS — R5383 Other fatigue: Secondary | ICD-10-CM

## 2019-10-17 DIAGNOSIS — R55 Syncope and collapse: Secondary | ICD-10-CM | POA: Diagnosis not present

## 2019-10-17 DIAGNOSIS — R519 Headache, unspecified: Secondary | ICD-10-CM

## 2019-10-17 DIAGNOSIS — G8929 Other chronic pain: Secondary | ICD-10-CM

## 2019-10-17 MED ORDER — NURTEC 75 MG PO TBDP: 75.0000 mg | ORAL_TABLET | Freq: Every day | ORAL | 5 refills | Status: DC | PRN

## 2019-10-17 MED ORDER — PREDNISONE 5 MG PO TABS: ORAL_TABLET | ORAL | 0 refills | Status: DC

## 2019-10-17 NOTE — Progress Notes (Signed)
PATIENT: Stacey Compton DOB: [birth year]  REASON FOR VISIT: follow up HISTORY FROM: patient  HISTORY OF PRESENT ILLNESS: Today 10/17/19:  [name] is a 33 year old female with a history of migraine headaches.  She returns today for follow-up.  She states that her migraines initially improved with Aimovig however for the last few months she feels like things have gotten worse.  She reports that she has had headache symptoms since November 13.  She reports that her headaches typically start off with an aura that she describes as numbness in the hands and face.  She states afterwards she will actually have pain in the head.  The location of her headache can vary.  She reports that she does have photophobia and phonophobia as well as nausea but no vomiting.  She reports in the past she could take Maxalt as soon as she felt the numbness and the headache would resolve.  She states that she has had new symptoms starting in the last month including extreme fatigue, 3 near syncopal events.  She states that there was several times she felt that she was unable to stand and had to lay down.  She did see her PCP who checked blood work which was relatively unremarkable.  In the past she has tried Topamax, gabapentin, Toradol, Percocet, tizanidine and Flexeril for her headaches.  She returns today for an evaluation.    HISTORY 08/01/19 [name] is a 33 y.o. female here today for follow up for migraines. She was last seen by Dr Epimenio FootSater 2 years ago. She was on topiramate but stopped around that time after she became pregnant with her son. Since she has not needed anything prescription grade for management. Over the past three weeks, she has had an increase in headaches. She reports all over pain, sometimes unilateral. It is a constant pain with occasional stabbing pains. She has blurred vision, mild nausea, light sensitivity and sound sensitivity. She can not find specific trigger. She has seen PCP several  times and treated with prednisone and rizatriptan. She was also started on tizanidine and tordol. Rizatriptan helps the most but isn't aborting headaches. She is not pregnant and has no plans to become pregnant. She is no longer breast feeding. She has MRI in 2014 that was normal. Headaches are similar to those in 2018.   REVIEW OF SYSTEMS: Out of a complete 14 system review of symptoms, the patient complains only of the following symptoms, and all other reviewed systems are negative.  See HPI  ALLERGIES: Allergies  Allergen Reactions  . Hydrocodone   . Hydrocodone-Acetaminophen   . Oxycodone-Acetaminophen   . Hydrocodone-Homatropine Nausea And Vomiting and Rash    Hycodan   . Percocet [Oxycodone-Acetaminophen] Hives and Rash    HOME MEDICATIONS: Outpatient Medications Prior to Visit  Medication Sig Dispense Refill  . citalopram (CELEXA) 20 MG tablet Take one tablet po every morning 30 tablet 5  . clonazePAM (KLONOPIN) 0.5 MG tablet Take 1/2-1 tablet po BID prn anxiety 24 tablet 0  . Erenumab-aooe 140 MG/ML SOAJ Inject 140 mg into the skin every 30 (thirty) days. 3 pen 3  . norethindrone (MICRONOR,CAMILA,ERRIN) 0.35 MG tablet Take 1 tablet by mouth daily.    . ondansetron (ZOFRAN-ODT) 8 MG disintegrating tablet Take 1 tablet (8 mg total) by mouth every 8 (eight) hours as needed for nausea or vomiting. 30 tablet 0  . rizatriptan (MAXALT-MLT) 10 MG disintegrating tablet Take 1 tablet (10 mg total) by mouth as needed  for migraine. May repeat in 2 hours if needed; Max 2 pills in 24 hours 10 tablet 0  . ketorolac (TORADOL) 10 MG tablet Take 1 tablet (10 mg total) by mouth every 6 (six) hours as needed. (Patient not taking: Reported on 10/17/2019) 20 tablet 0  . tiZANidine (ZANAFLEX) 4 MG tablet Take 1 tablet (4 mg total) by mouth 3 (three) times daily as needed (migraine headache). Home use only-caution drowsiness (Patient not taking: Reported on 10/17/2019) 21 tablet 0    Facility-Administered Medications Prior to Visit  Medication Dose Route Frequency Provider Last Rate Last Admin  . methylPREDNISolone acetate (DEPO-MEDROL) injection 40 mg  40 mg Intra-Lesional Once Merlyn Albert, MD        PAST MEDICAL HISTORY: Past Medical History:  Diagnosis Date  . Anxiety   . Depression    denies  . Endometriosis   . Hx of varicella   . IBS (irritable bowel syndrome)   . Infection    UTI  . Migraines    with aura    PAST SURGICAL HISTORY: Past Surgical History:  Procedure Laterality Date  . LAPAROSCOPY     with chromopertubation  . OTHER SURGICAL HISTORY     dx lap for endometriosis    FAMILY HISTORY: Family History  Problem Relation Age of Onset  . Fibromyalgia Mother   . Diabetes Mother        borderline  . Depression Father   . Diabetes Father        type 2  . Anxiety disorder Sister   . Bipolar disorder Maternal Aunt   . Cancer Maternal Grandmother        mouth and lung  . Bipolar disorder Maternal Grandfather   . Stroke Maternal Grandfather   . Heart disease Paternal Grandfather     SOCIAL HISTORY: Social History   Socioeconomic History  . Marital status: Married    Spouse name: Not on file  . Number of children: Not on file  . Years of education: Not on file  . Highest education level: Not on file  Occupational History  . Not on file  Tobacco Use  . Smoking status: Never Smoker  . Smokeless tobacco: Never Used  Substance and Sexual Activity  . Alcohol use: No  . Drug use: No  . Sexual activity: Yes    Birth control/protection: None  Other Topics Concern  . Not on file  Social History Narrative  . Not on file   Social Determinants of Health   Financial Resource Strain:   . Difficulty of Paying Living Expenses: Not on file  Food Insecurity:   . Worried About Programme researcher, broadcasting/film/video in the Last Year: Not on file  . Ran Out of Food in the Last Year: Not on file  Transportation Needs:   . Lack of Transportation  (Medical): Not on file  . Lack of Transportation (Non-Medical): Not on file  Physical Activity:   . Days of Exercise per Week: Not on file  . Minutes of Exercise per Session: Not on file  Stress:   . Feeling of Stress : Not on file  Social Connections:   . Frequency of Communication with Friends and Family: Not on file  . Frequency of Social Gatherings with Friends and Family: Not on file  . Attends Religious Services: Not on file  . Active Member of Clubs or Organizations: Not on file  . Attends Banker Meetings: Not on file  . Marital Status: Not  on file  Intimate Partner Violence:   . Fear of Current or Ex-Partner: Not on file  . Emotionally Abused: Not on file  . Physically Abused: Not on file  . Sexually Abused: Not on file      PHYSICAL EXAM  Vitals:   10/17/19 1303  BP: 120/88  Pulse: 99  Temp: 97.8 F (36.6 C)  Weight: 138 lb 9.6 oz (62.9 kg)  Height: 5\' 3"  (1.6 m)   Body mass index is 24.55 kg/m.  Generalized: Well developed, in no acute distress   Neurological examination  Mentation: Alert oriented to time, place, history taking. Follows all commands speech and language fluent Cranial nerve II-XII: Pupils were equal round reactive to light. Facial sensation and strength were normal. Uvula tongue midline. Head turning and shoulder shrug  were normal and symmetric. Motor: The motor testing reveals 5 over 5 strength of all 4 extremities. Good symmetric motor tone is noted throughout.  Sensory: Sensory testing is intact to soft touch on all 4 extremities. No evidence of extinction is noted.  Coordination: Cerebellar testing reveals good finger-nose-finger and heel-to-shin bilaterally.  Gait and station: Gait is normal.    Reflexes: Deep tendon reflexes are symmetric and normal bilaterally.   DIAGNOSTIC DATA (LABS, IMAGING, TESTING) - I reviewed patient records, labs, notes, testing and imaging myself where available.  Lab Results  Component Value  Date   WBC 7.2 10/11/2019   HGB 14.5 10/11/2019   HCT 43.5 10/11/2019   MCV 95 10/11/2019   PLT 299 10/11/2019      Component Value Date/Time   NA 140 10/11/2019 0958   K 4.8 10/11/2019 0958   CL 102 10/11/2019 0958   CO2 24 10/11/2019 0958   GLUCOSE 70 10/11/2019 0958   GLUCOSE 89 03/05/2018 1105   BUN 9 10/11/2019 0958   CREATININE 0.70 10/11/2019 0958   CALCIUM 9.4 10/11/2019 0958   PROT 7.7 10/11/2019 0958   ALBUMIN 4.9 (H) 10/11/2019 0958   AST 17 10/11/2019 0958   ALT 8 10/11/2019 0958   ALKPHOS 62 10/11/2019 0958   BILITOT 0.4 10/11/2019 0958   GFRNONAA 113 10/11/2019 0958   GFRAA 130 10/11/2019 0958   No results found for: CHOL, HDL, LDLCALC, LDLDIRECT, TRIG, CHOLHDL No results found for: HGBA1C No results found for: VITAMINB12 Lab Results  Component Value Date   TSH 1.300 10/11/2019      ASSESSMENT AND PLAN 33 y.o. year old female  has a past medical history of Anxiety, Depression, Endometriosis, varicella, IBS (irritable bowel syndrome), Infection, and Migraines. here with:  1.  Migraine headache 2: Near syncopal events 3: Fatigue  -Continue Aimovig -Start Botox-discussed potential side effects - Nurtec ordered- reviewed potential side effects and provided her with a co-pay card -Continue Maxalt -MRI of the brain due to new symptoms of near syncopal events and extreme fatigue -Prednisone Dosepak to hopefully break the cycle of current headache  Patient is advised that if her symptoms worsen or she develops new symptoms she should let us know.  She will follow-up in 4 months or sooner if needed.   I spent 25 minutes with the patient. 50% of this time was spent reviewing plan of care   Ward Givens, MSN, NP-C 10/17/2019, 1:15 PM [facility] [address], [place], [place] [address] [phone]

## 2019-10-17 NOTE — Patient Instructions (Addendum)
Your Plan:  Continue Aimovig Botox ordered MRI brain ordered Nurtec for abortive therapy If your symptoms worsen or you develop new symptoms please let us know.   Thank you for coming to see [place] at [facility]. I hope we have been able to provide you high quality care today.  You may receive a patient satisfaction survey over the next few weeks. We would appreciate your feedback and comments so that we may continue to improve ourselves and the health of our patients.

## 2019-10-18 NOTE — Progress Notes (Signed)
I have read the note, and I agree with the clinical assessment and plan.  Elimelech Houseman A. Nioma Mccubbins, MD, PhD, FAAN Certified in Neurology, Clinical Neurophysiology, Sleep Medicine, Pain Medicine and Neuroimaging  [name] [address], [address] [place], [place] [address] [phone]  

## 2019-10-21 ENCOUNTER — Encounter: Payer: Self-pay | Admitting: Adult Health

## 2019-10-23 ENCOUNTER — Telehealth: Payer: Self-pay

## 2019-10-23 ENCOUNTER — Encounter (HOSPITAL_COMMUNITY): Payer: Self-pay

## 2019-10-23 ENCOUNTER — Other Ambulatory Visit: Payer: Self-pay

## 2019-10-23 ENCOUNTER — Telehealth: Payer: Self-pay | Admitting: Adult Health

## 2019-10-23 ENCOUNTER — Emergency Department (HOSPITAL_COMMUNITY): Payer: BC Managed Care – PPO

## 2019-10-23 ENCOUNTER — Emergency Department (HOSPITAL_COMMUNITY)
Admission: EM | Admit: 2019-10-23 | Discharge: 2019-10-23 | Disposition: A | Discharge status: Home / Self Care | Payer: BC Managed Care – PPO | Attending: Emergency Medicine | Admitting: Emergency Medicine

## 2019-10-23 DIAGNOSIS — Z79899 Other long term (current) drug therapy: Secondary | ICD-10-CM | POA: Insufficient documentation

## 2019-10-23 DIAGNOSIS — R1031 Right lower quadrant pain: Secondary | ICD-10-CM | POA: Insufficient documentation

## 2019-10-23 DIAGNOSIS — R1110 Vomiting, unspecified: Secondary | ICD-10-CM | POA: Diagnosis not present

## 2019-10-23 DIAGNOSIS — R197 Diarrhea, unspecified: Secondary | ICD-10-CM | POA: Diagnosis not present

## 2019-10-23 DIAGNOSIS — R1030 Lower abdominal pain, unspecified: Secondary | ICD-10-CM

## 2019-10-23 LAB — CBC WITH DIFFERENTIAL/PLATELET
Abs Immature Granulocytes: 0.03 10*3/uL (ref 0.00–0.07)
Basophils Absolute: 0 10*3/uL (ref 0.0–0.1)
Basophils Relative: 0 %
Eosinophils Absolute: 0 10*3/uL (ref 0.0–0.5)
Eosinophils Relative: 0 %
HCT: 50 % — ABNORMAL HIGH (ref 36.0–46.0)
Hemoglobin: 16.6 g/dL — ABNORMAL HIGH (ref 12.0–15.0)
Immature Granulocytes: 0 %
Lymphocytes Relative: 21 %
Lymphs Abs: 2.1 10*3/uL (ref 0.7–4.0)
MCH: 32 pg (ref 26.0–34.0)
MCHC: 33.2 g/dL (ref 30.0–36.0)
MCV: 96.3 fL (ref 80.0–100.0)
Monocytes Absolute: 0.3 10*3/uL (ref 0.1–1.0)
Monocytes Relative: 3 %
Neutro Abs: 7.8 10*3/uL — ABNORMAL HIGH (ref 1.7–7.7)
Neutrophils Relative %: 76 %
Platelets: 357 10*3/uL (ref 150–400)
RBC: 5.19 MIL/uL — ABNORMAL HIGH (ref 3.87–5.11)
RDW: 12 % (ref 11.5–15.5)
WBC: 10.2 10*3/uL (ref 4.0–10.5)
nRBC: 0 % (ref 0.0–0.2)

## 2019-10-23 LAB — URINALYSIS, ROUTINE W REFLEX MICROSCOPIC
Bilirubin Urine: NEGATIVE
Glucose, UA: NEGATIVE mg/dL
Hgb urine dipstick: NEGATIVE
Ketones, ur: NEGATIVE mg/dL
Leukocytes,Ua: NEGATIVE
Nitrite: NEGATIVE
Protein, ur: NEGATIVE mg/dL
Specific Gravity, Urine: 1.019 (ref 1.005–1.030)
pH: 5 (ref 5.0–8.0)

## 2019-10-23 LAB — COMPREHENSIVE METABOLIC PANEL
ALT: 17 U/L (ref 0–44)
AST: 18 U/L (ref 15–41)
Albumin: 4.9 g/dL (ref 3.5–5.0)
Alkaline Phosphatase: 55 U/L (ref 38–126)
Anion gap: 9 (ref 5–15)
BUN: 9 mg/dL (ref 6–20)
CO2: 27 mmol/L (ref 22–32)
Calcium: 9.4 mg/dL (ref 8.9–10.3)
Chloride: 103 mmol/L (ref 98–111)
Creatinine, Ser: 0.57 mg/dL (ref 0.44–1.00)
GFR calc Af Amer: >60 mL/min (ref >60)
GFR calc non Af Amer: >60 mL/min (ref >60)
Glucose, Bld: 105 mg/dL — ABNORMAL HIGH (ref 70–99)
Potassium: 3.7 mmol/L (ref 3.5–5.1)
Sodium: 139 mmol/L (ref 135–145)
Total Bilirubin: 0.4 mg/dL (ref 0.3–1.2)
Total Protein: 8.7 g/dL — ABNORMAL HIGH (ref 6.5–8.1)

## 2019-10-23 LAB — I-STAT BETA HCG BLOOD, ED (MC, WL, AP ONLY): I-stat hCG, quantitative: <5 m[IU]/mL (ref <5)

## 2019-10-23 LAB — LIPASE, BLOOD: Lipase: 34 U/L (ref 11–51)

## 2019-10-23 MED ORDER — KETOROLAC TROMETHAMINE 30 MG/ML IJ SOLN
30.0000 mg | Freq: Once | INTRAMUSCULAR | Status: AC
  Administered 2019-10-23: 30 mg via INTRAVENOUS
  Filled 2019-10-23: qty 1

## 2019-10-23 MED ORDER — ONDANSETRON HCL 4 MG/2ML IJ SOLN
4.0000 mg | Freq: Once | INTRAMUSCULAR | Status: AC
  Administered 2019-10-23: 4 mg via INTRAVENOUS
  Filled 2019-10-23: qty 2

## 2019-10-23 MED ORDER — TRAMADOL HCL 50 MG PO TABS: 50.0000 mg | ORAL_TABLET | Freq: Four times a day (QID) | ORAL | 0 refills | Status: DC | PRN

## 2019-10-23 MED ORDER — HYDROMORPHONE HCL 1 MG/ML IJ SOLN
1.0000 mg | Freq: Once | INTRAMUSCULAR | Status: AC
  Administered 2019-10-23: 1 mg via INTRAVENOUS
  Filled 2019-10-23: qty 1

## 2019-10-23 MED ORDER — SODIUM CHLORIDE 0.9 % IV BOLUS
1000.0000 mL | Freq: Once | INTRAVENOUS | Status: AC
  Administered 2019-10-23: 1000 mL via INTRAVENOUS

## 2019-10-23 MED ORDER — IOHEXOL 300 MG/ML  SOLN
100.0000 mL | Freq: Once | INTRAMUSCULAR | Status: AC | PRN
  Administered 2019-10-23: 100 mL via INTRAVENOUS

## 2019-10-23 NOTE — Telephone Encounter (Signed)
Pending approval for Nurtec 75 mg Key: [id number] ICD 10 code: T26.712  Your information has been submitted to [place]. [employer] [place] will review the request and fax you a determination directly, typically within 3 business days of your submission once all necessary information is received.  If [employer] [place] has not responded in 3 business days or if you have any questions about your submission, contact [place] at [phone].

## 2019-10-23 NOTE — ED Provider Notes (Signed)
[facility] EMERGENCY DEPARTMENT Provider Note   CSN: [id number] Arrival date & time: 10/23/19  1713     History Chief Complaint  Patient presents with  . Abdominal Pain    Stacey Compton is a 33 y.o. female.  Patient complains of lower abdominal pain.  The history is provided by the patient. No language interpreter was used.  Abdominal Pain Pain location:  RLQ Pain quality: aching   Pain radiates to:  Does not radiate Pain severity:  Moderate Onset quality:  Sudden Timing:  Constant Progression:  Worsening Chronicity:  New Context: not alcohol use   Associated symptoms: diarrhea and vomiting   Associated symptoms: no chest pain, no cough, no fatigue and no hematuria        Past Medical History:  Diagnosis Date  . Anxiety   . Depression    denies  . Endometriosis   . Hx of varicella   . IBS (irritable bowel syndrome)   . Infection    UTI  . Migraines    with aura    Patient Active Problem List   Diagnosis Date Noted  . Encounter for induction of labor 04/19/2018  . Frequent headaches 07/06/2017  . Neck pain 07/06/2017  . Gastroesophageal reflux disease without esophagitis 04/01/2016  . Anxiety and depression 04/01/2016  . Classic migraine 08/20/2015  . Shoulder tendinitis 08/08/2013  . Muscle spasm of right shoulder 08/08/2013  . Vaginal delivery 04/08/2013    Class: Status post    Past Surgical History:  Procedure Laterality Date  . LAPAROSCOPY     with chromopertubation  . OTHER SURGICAL HISTORY     dx lap for endometriosis     OB History    Gravida  2   Para  1   Term  1   Preterm      AB      Living  1     SAB      TAB      Ectopic      Multiple      Live Births  1           Family History  Problem Relation Age of Onset  . Fibromyalgia Mother   . Diabetes Mother        borderline  . Depression Father   . Diabetes Father        type 2  . Anxiety disorder Sister   . Bipolar disorder Maternal Aunt   .  Cancer Maternal Grandmother        mouth and lung  . Bipolar disorder Maternal Grandfather   . Stroke Maternal Grandfather   . Heart disease Paternal Grandfather     Social History   Tobacco Use  . Smoking status: Never Smoker  . Smokeless tobacco: Never Used  Substance Use Topics  . Alcohol use: No  . Drug use: No    Home Medications Prior to Admission medications   Medication Sig Start Date End Date Taking? Authorizing Provider  citalopram (CELEXA) 20 MG tablet Take one tablet po every morning Patient taking differently: Take 20 mg by mouth at bedtime.  09/23/19  Yes Merlyn Albert, MD  clonazePAM (KLONOPIN) 0.5 MG tablet Take 1/2-1 tablet po BID prn anxiety Patient taking differently: Take 0.25-0.5 mg by mouth 2 (two) times daily as needed for anxiety.  09/23/19  Yes Merlyn Albert, MD  Erenumab-aooe 140 MG/ML SOAJ Inject 140 mg into the skin every 30 (thirty) days. 07/31/19  Yes Lomax, Amy,  NP  ketorolac (TORADOL) 10 MG tablet Take 1 tablet (10 mg total) by mouth every 6 (six) hours as needed. Patient taking differently: Take 10 mg by mouth every 6 (six) hours as needed. For pain 10/11/19  Yes Luking, Scott A, MD  norethindrone (MICRONOR,CAMILA,ERRIN) 0.35 MG tablet Take 1 tablet by mouth at bedtime.  11/26/18  Yes [provider]  predniSONE (DELTASONE) 5 MG tablet Begin taking 6 tablets daily, taper by one tablet daily until off the medication. 10/17/19  Yes Butch PennyMillikan, Megan, NP  rizatriptan (MAXALT-MLT) 10 MG disintegrating tablet Take 1 tablet (10 mg total) by mouth as needed for migraine. May repeat in 2 hours if needed; Max 2 pills in 24 hours 07/19/19  Yes Sherie DonHoskins, Carolyn C, NP  ondansetron (ZOFRAN-ODT) 8 MG disintegrating tablet Take 1 tablet (8 mg total) by mouth every 8 (eight) hours as needed for nausea or vomiting. 07/26/19   Campbell RichesHoskins, Carolyn C, NP  Rimegepant Sulfate (NURTEC) 75 MG TBDP Take 75 mg by mouth daily as needed. Take at the onset of migraine.1  tablet/24 hours Patient not taking: Reported on 10/23/2019 10/17/19   Butch PennyMillikan, Megan, NP  tiZANidine (ZANAFLEX) 4 MG tablet Take 1 tablet (4 mg total) by mouth 3 (three) times daily as needed (migraine headache). Home use only-caution drowsiness Patient not taking: Reported on 10/17/2019 07/22/19   Babs SciaraLuking, Scott A, MD  traMADol (ULTRAM) 50 MG tablet Take 1 tablet (50 mg total) by mouth every 6 (six) hours as needed. 10/23/19   Bethann BerkshireZammit, Cannie Muckle, MD    Allergies    Hydrocodone-homatropine and Percocet [oxycodone-acetaminophen]  Review of Systems   Review of Systems  Constitutional: Negative for appetite change and fatigue.  HENT: Negative for congestion, ear discharge and sinus pressure.   Eyes: Negative for discharge.  Respiratory: Negative for cough.   Cardiovascular: Negative for chest pain.  Gastrointestinal: Positive for abdominal pain, diarrhea and vomiting.  Genitourinary: Negative for frequency and hematuria.  Musculoskeletal: Negative for back pain.  Skin: Negative for rash.  Neurological: Negative for seizures and headaches.  Psychiatric/Behavioral: Negative for hallucinations.    Physical Exam Updated Vital Signs BP 103/79   Pulse 87   Resp 16   Ht 5\' 3"  (1.6 m)   Wt 61.2 kg   LMP 10/08/2019   SpO2 99%   BMI 23.91 kg/m   Physical Exam Vitals and nursing note reviewed.  Constitutional:      Appearance: She is well-developed.  HENT:     Head: Normocephalic.     Nose: Nose normal.  Eyes:     General: No scleral icterus.    Conjunctiva/sclera: Conjunctivae normal.  Neck:     Thyroid: No thyromegaly.  Cardiovascular:     Rate and Rhythm: Normal rate and regular rhythm.     Heart sounds: No murmur. No friction rub. No gallop.   Pulmonary:     Breath sounds: No stridor. No wheezing or rales.  Chest:     Chest wall: No tenderness.  Abdominal:     General: There is no distension.     Tenderness: There is abdominal tenderness. There is no rebound.    Musculoskeletal:        General: Normal range of motion.     Cervical back: Neck supple.  Lymphadenopathy:     Cervical: No cervical adenopathy.  Skin:    Findings: No erythema or rash.  Neurological:     Mental Status: She is oriented to person, place, and time.  Motor: No abnormal muscle tone.     Coordination: Coordination normal.  Psychiatric:        Behavior: Behavior normal.     ED Results / Procedures / Treatments   Labs (all labs ordered are listed, but only abnormal results are displayed) Labs Reviewed  CBC WITH DIFFERENTIAL/PLATELET - Abnormal; Notable for the following components:      Result Value   RBC 5.19 (*)    Hemoglobin 16.6 (*)    HCT 50.0 (*)    Neutro Abs 7.8 (*)    All other components within normal limits  COMPREHENSIVE METABOLIC PANEL - Abnormal; Notable for the following components:   Glucose, Bld 105 (*)    Total Protein 8.7 (*)    All other components within normal limits  LIPASE, BLOOD  URINALYSIS, ROUTINE W REFLEX MICROSCOPIC  I-STAT BETA HCG BLOOD, ED ([facility], [facility], [facility] ONLY)  POC SARS CORONAVIRUS 2 AG -  ED    EKG None  Radiology CT ABDOMEN PELVIS W CONTRAST  Result Date: 10/23/2019 CLINICAL DATA:  Lower abdominal pain. EXAM: CT ABDOMEN AND PELVIS WITH CONTRAST TECHNIQUE: Multidetector CT imaging of the abdomen and pelvis was performed using the standard protocol following bolus administration of intravenous contrast. CONTRAST:  OMNIPAQUE IOHEXOL 300 MG/ML  SOLN COMPARISON:  None. FINDINGS: Lower chest: Lung bases are clear. No effusions. Heart is normal size. Hepatobiliary: Gallstones noted within the gallbladder. No focal hepatic abnormality. Pancreas: No focal abnormality or ductal dilatation. Spleen: No focal abnormality.  Normal size. Adrenals/Urinary Tract: No adrenal abnormality. No focal renal abnormality. No stones or hydronephrosis. Urinary bladder is unremarkable. Stomach/Bowel: Stomach, large and small bowel grossly  unremarkable. Appendix not definitively seen. No inflammatory process in the right lower quadrant. Vascular/Lymphatic: No evidence of aneurysm or adenopathy. Reproductive: Uterus and adnexa unremarkable.  No mass. Other: Moderate free fluid in the pelvis.  No free air. Musculoskeletal: No acute bony abnormality. IMPRESSION: Cholelithiasis. Moderate free fluid in the pelvis. Electronically Signed   By: Charlett Nose M.D.   On: 10/23/2019 21:16    Procedures Procedures (including critical care time)  Medications Ordered in ED Medications  ondansetron (ZOFRAN) injection 4 mg (4 mg Intravenous Given 10/23/19 1735)  ketorolac (TORADOL) 30 MG/ML injection 30 mg (30 mg Intravenous Given 10/23/19 1734)  sodium chloride 0.9 % bolus 1,000 mL (0 mLs Intravenous Stopped 10/23/19 1904)  HYDROmorphone (DILAUDID) injection 1 mg (1 mg Intravenous Given 10/23/19 2034)  iohexol (OMNIPAQUE) 300 MG/ML solution 100 mL (100 mLs Intravenous Contrast Given 10/23/19 2045)    ED Course  I have reviewed the triage vital signs and the nursing notes.  Pertinent labs & imaging results that were available during my care of the patient were reviewed by me and considered in my medical decision making (see chart for details).    MDM Rules/Calculators/A&P                     CT scan shows pelvic fluid.  I spoke with the radiologist and this is consistent with a ruptured ovarian cyst.  She also has gallstones.  But no cholecystitis.  Patient given Ultram to help with the pain from what is most likely a ruptured ovarian cyst and she is referred to general surgery for her gallstones Final Clinical Impression(s) / ED Diagnoses Final diagnoses:  Lower abdominal pain    Rx / DC Orders ED Discharge Orders         Ordered    traMADol (ULTRAM) 50  MG tablet  Every 6 hours PRN     10/23/19 2149           Milton Ferguson, MD 10/23/19 2154

## 2019-10-23 NOTE — Discharge Instructions (Signed)
Follow-up with your family doctor or your OB/GYN doctor for your lower abdominal pain most likely cause from ruptured ovarian cyst.  Follow-up with Dr. Constance Haw for your gallstones

## 2019-10-23 NOTE — Telephone Encounter (Signed)
PA for [employer] done on cover my meds:  Your information has been submitted to [employer] [place]. [employer] [place] will review the request and fax you a determination directly, typically within 3 business days of your submission once all necessary information is received. If [employer] [place] has not responded in 3 business days or if you have any questions about your submission, contact [employer] [place] at [phone]  

## 2019-10-23 NOTE — ED Triage Notes (Signed)
Pt started having severe lower left and right abdominal pain. Pt passed out she believes. Was treated last week for migraines. Vomiting, diarrhea, and diaphoresis upon arrival of EMS.

## 2019-10-23 NOTE — ED Notes (Signed)
Pt was informed that we need a urine sample. 

## 2019-10-23 NOTE — Telephone Encounter (Signed)
IF pt calls back vm was left for her. Pts mom is not on the dpr or emergency contact to speak with. LEft vm for patient or husband to call back about severe vomiting.

## 2019-10-23 NOTE — Telephone Encounter (Signed)
I called the patient and spoke to her husband.  He advised that he was not with the patient.  He advised that they called EMS because the patient developed severe vomiting approximately 1 hour ago.  She is being taken to [facility].  I will continue to review the notes on epic

## 2019-10-23 NOTE — Telephone Encounter (Signed)
Pt's mother called to speak for her daughter. Pt is having sever vomiting and they are wanting to know if it has to do with the predniSONE (DELTASONE) 5 MG tablet that she is on. They would like to be advised if they should go to the emergency room. Please advise.

## 2019-10-24 MED ORDER — TRAMADOL HCL 50 MG PO TABS: 50.0000 mg | ORAL_TABLET | Freq: Four times a day (QID) | ORAL | 0 refills | Status: DC | PRN

## 2019-10-24 NOTE — Telephone Encounter (Signed)
Patient seen in the emergency department yesterday.  Tramadol was sent to [facility] to called to say they do not have this in stock.  I have written an additional tramadol prescription to be sent to the [facility].  Patient advised she can pick up there. [place] drug database reviewed.

## 2019-10-24 NOTE — Telephone Encounter (Signed)
[employer] approve from 10/23/2019 to 10/21/2020.

## 2019-10-28 ENCOUNTER — Other Ambulatory Visit: Payer: Self-pay

## 2019-10-28 ENCOUNTER — Ambulatory Visit: Payer: BC Managed Care – PPO | Attending: Internal Medicine

## 2019-10-28 DIAGNOSIS — Z20822 Contact with and (suspected) exposure to covid-19: Secondary | ICD-10-CM

## 2019-10-28 MED ORDER — NURTEC 75 MG PO TBDP: 75.0000 mg | ORAL_TABLET | Freq: Every day | ORAL | 5 refills | Status: DC | PRN

## 2019-10-29 ENCOUNTER — Encounter: Payer: Self-pay | Admitting: General Surgery

## 2019-10-29 ENCOUNTER — Other Ambulatory Visit: Payer: Self-pay

## 2019-10-29 ENCOUNTER — Ambulatory Visit: Payer: BC Managed Care – PPO | Admitting: General Surgery

## 2019-10-29 VITALS — BP 109/80 | HR 91 | Temp 98.8°F | Resp 16 | Ht 63.0 in | Wt 135.0 lb

## 2019-10-29 DIAGNOSIS — K8020 Calculus of gallbladder without cholecystitis without obstruction: Secondary | ICD-10-CM | POA: Diagnosis not present

## 2019-10-29 LAB — NOVEL CORONAVIRUS, NAA: SARS-CoV-2, NAA: NOT DETECTED

## 2019-10-29 NOTE — Patient Instructions (Signed)
Laparoscopic Cholecystectomy Laparoscopic cholecystectomy is surgery to remove the gallbladder. The gallbladder is a pear-shaped organ that lies beneath the liver on the right side of the body. The gallbladder stores bile, which is a fluid that helps the body to digest fats. Cholecystectomy is often done for inflammation of the gallbladder (cholecystitis). This condition is usually caused by a buildup of gallstones (cholelithiasis) in the gallbladder. Gallstones can block the flow of bile, which can result in inflammation and pain. In severe cases, emergency surgery may be required. This procedure is done though small incisions in your abdomen (laparoscopic surgery). A thin scope with a camera (laparoscope) is inserted through one incision. Thin surgical instruments are inserted through the other incisions. In some cases, a laparoscopic procedure may be turned into a type of surgery that is done through a larger incision (open surgery). Tell a health care provider about:  Any allergies you have.  All medicines you are taking, including vitamins, herbs, eye drops, creams, and over-the-counter medicines.  Any problems you or family members have had with anesthetic medicines.  Any blood disorders you have.  Any surgeries you have had.  Any medical conditions you have.  Whether you are pregnant or may be pregnant. What are the risks? Generally, this is a safe procedure. However, problems may occur, including:  Infection.  Bleeding.  Allergic reactions to medicines.  Damage to other structures or organs.  A stone remaining in the common bile duct. The common bile duct carries bile from the gallbladder into the small intestine.  A bile leak from the cyst duct that is clipped when your gallbladder is removed. Medicines  Ask your health care provider about: ? Changing or stopping your regular medicines. This is especially important if you are taking diabetes medicines or blood thinners. ?  Taking medicines such as aspirin and ibuprofen. These medicines can thin your blood. Do not take these medicines before your procedure if your health care provider instructs you not to.  You may be given antibiotic medicine to help prevent infection. General instructions  Let your health care provider know if you develop a cold or an infection before surgery.  Plan to have someone take you home from the hospital or clinic.  Ask your health care provider how your surgical site will be marked or identified. What happens during the procedure?   To reduce your risk of infection: ? Your health care team will wash or sanitize their hands. ? Your skin will be washed with soap. ? Hair may be removed from the surgical area.  An IV tube may be inserted into one of your veins.  You will be given one or more of the following: ? A medicine to help you relax (sedative). ? A medicine to make you fall asleep (general anesthetic).  A breathing tube will be placed in your mouth.  Your surgeon will make several small cuts (incisions) in your abdomen.  The laparoscope will be inserted through one of the small incisions. The camera on the laparoscope will send images to a TV screen (monitor) in the operating room. This lets your surgeon see inside your abdomen.  Air-like gas will be pumped into your abdomen. This will expand your abdomen to give the surgeon more room to perform the surgery.  Other tools that are needed for the procedure will be inserted through the other incisions. The gallbladder will be removed through one of the incisions.  Your common bile duct may be examined. If stones are found   in the common bile duct, they may be removed.  After your gallbladder has been removed, the incisions will be closed with stitches (sutures), staples, or skin glue.  Your incisions may be covered with a bandage (dressing). The procedure may vary among health care providers and hospitals. What happens  after the procedure?  Your blood pressure, heart rate, breathing rate, and blood oxygen level will be monitored until the medicines you were given have worn off.  You will be given medicines as needed to control your pain.  Do not drive for 24 hours if you were given a sedative. This information is not intended to replace advice given to you by your health care provider. Make sure you discuss any questions you have with your health care provider. Document Released: 10/24/2005 Document Revised: 10/06/2017 Document Reviewed: 04/11/2016 Elsevier Patient Education  2020 Elsevier Inc. Cholelithiasis  Cholelithiasis is also called "gallstones." It is a kind of gallbladder disease. The gallbladder is an organ that stores a liquid (bile) that helps you digest fat. Gallstones may not cause symptoms (may be silent gallstones) until they cause a blockage, and then they can cause pain (gallbladder attack). Follow these instructions at home:  Take over-the-counter and prescription medicines only as told by your doctor.  Stay at a healthy weight.  Eat healthy foods. This includes: ? Eating fewer fatty foods, like fried foods. ? Eating fewer refined carbs (refined carbohydrates). Refined carbs are breads and grains that are highly processed, like white bread and white rice. Instead, choose whole grains like whole-wheat bread and brown rice. ? Eating more fiber. Almonds, fresh fruit, and beans are healthy sources of fiber.  Keep all follow-up visits as told by your doctor. This is important. Contact a doctor if:  You have sudden pain in the upper right side of your belly (abdomen). Pain might spread to your right shoulder or your chest. This may be a sign of a gallbladder attack.  You feel sick to your stomach (are nauseous).  You throw up (vomit).  You have been diagnosed with gallstones that have no symptoms and you get: ? Belly pain. ? Discomfort, burning, or fullness in the upper part of your  belly (indigestion). Get help right away if:  You have sudden pain in the upper right side of your belly, and it lasts for more than 2 hours.  You have belly pain that lasts for more than 5 hours.  You have a fever or chills.  You keep feeling sick to your stomach or you keep throwing up.  Your skin or the whites of your eyes turn yellow (jaundice).  You have dark-colored pee (urine).  You have light-colored poop (stool). Summary  Cholelithiasis is also called "gallstones."  The gallbladder is an organ that stores a liquid (bile) that helps you digest fat.  Silent gallstones are gallstones that do not cause symptoms.  A gallbladder attack may cause sudden pain in the upper right side of your belly. Pain might spread to your right shoulder or your chest. If this happens, contact your doctor.  If you have sudden pain in the upper right side of your belly that lasts for more than 2 hours, get help right away. This information is not intended to replace advice given to you by your health care provider. Make sure you discuss any questions you have with your health care provider. Document Released: 04/11/2008 Document Revised: 10/06/2017 Document Reviewed: 07/10/2016 Elsevier Patient Education  2020 Elsevier Inc.  

## 2019-10-29 NOTE — Progress Notes (Signed)
[facility] History and Physical  Reason for Referral: Gallstones  Referring Physician: ED     Chief Complaint     Abdominal Pain      Stacey Compton is a 33 y.o. female.  HPI: Ms. Berger is a very sweet 33 yo who comes in with a recent ED visit with some right sided pain that was RUQ and RLQ. She had imaging done that demonstrated stones in her gallbladder, no obvious signs of cholecystitis, and some free fluid in the pelvis possibly from a ruptured cyst. She said that prior to this episode she really had never had such severe pain. She says that the pain was sudden onset, sharp in nature, and was associated with diarrhea and vomiting. She actually passed out and had to get EMS to take her to the ED. She was able to get some relief after getting pain medication in the ED. She says that now that she thinks about it that she has had upset stomach and indigestion issues for a while, and wonders if this was related to her gallbladder. She has been having issues with migraines and has been on different medicines, and she says that she is just overall tired and weak. She has two children at home and stays at home with them.      Past Medical History:  Diagnosis Date  . Anxiety   . Depression    denies  . Endometriosis   . Hx of varicella   . IBS (irritable bowel syndrome)   . Infection    UTI  . Migraines    with aura        Past Surgical History:  Procedure Laterality Date  . LAPAROSCOPY     with chromopertubation  . OTHER SURGICAL HISTORY     dx lap for endometriosis        Family History  Problem Relation Age of Onset  . Fibromyalgia Mother   . Diabetes Mother    borderline  . Depression Father   . Diabetes Father    type 2  . Anxiety disorder Sister   . Bipolar disorder Maternal Aunt   . Cancer Maternal Grandmother    mouth and lung  . Bipolar disorder Maternal Grandfather   . Stroke Maternal Grandfather   . Heart disease Paternal Grandfather     Social History       Tobacco Use  . Smoking status: Never Smoker  . Smokeless tobacco: Never Used  Substance Use Topics  . Alcohol use: No  . Drug use: No   Medications: I have reviewed the patient's current medications.       Allergies as of 10/29/2019      Reactions   Hydrocodone-homatropine Nausea And Vomiting, Rash   Hycodan    Percocet [oxycodone-acetaminophen] Hives, Rash           Medication List       Accurate as of October 29, 2019 10:11 AM. If you have any questions, ask your nurse or doctor.        citalopram 20 MG tablet  Commonly known as: CELEXA  Take one tablet po every morning  What changed:  how much to take  how to take this  when to take this  additional instructions  clonazePAM 0.5 MG tablet  Commonly known as: KlonoPIN  Take 1/2-1 tablet po BID prn anxiety  What changed:  how much to take  how to take this  when to take this  reasons to   take this  additional instructions  Erenumab-aooe 140 MG/ML Soaj  Inject 140 mg into the skin every 30 (thirty) days.   ketorolac 10 MG tablet  Commonly known as: TORADOL  Take 1 tablet (10 mg total) by mouth every 6 (six) hours as needed.  What changed: additional instructions   norethindrone 0.35 MG tablet  Commonly known as: MICRONOR  Take 1 tablet by mouth at bedtime.   Nurtec 75 MG Tbdp  Generic drug: Rimegepant Sulfate  Take 75 mg by mouth daily as needed. Take at the onset of migraine.1 tablet/24 hours   ondansetron 8 MG disintegrating tablet  Commonly known as: ZOFRAN-ODT  Take 1 tablet (8 mg total) by mouth every 8 (eight) hours as needed for nausea or vomiting.   predniSONE 5 MG tablet  Commonly known as: DELTASONE  Begin taking 6 tablets daily, taper by one tablet daily until off the medication.   rizatriptan 10 MG disintegrating tablet  Commonly known as: MAXALT-MLT  Take 1 tablet (10 mg total) by mouth as needed for migraine. May repeat in 2 hours if needed; Max 2 pills in 24  hours   tiZANidine 4 MG tablet  Commonly known as: Zanaflex  Take 1 tablet (4 mg total) by mouth 3 (three) times daily as needed (migraine headache). Home use only-caution drowsiness   traMADol 50 MG tablet  Commonly known as: ULTRAM  Take 1 tablet (50 mg total) by mouth every 6 (six) hours as needed.   traMADol 50 MG tablet  Commonly known as: ULTRAM  Take 1 tablet (50 mg total) by mouth every 6 (six) hours as needed.       ROS:  A comprehensive review of systems was negative except for: Constitutional: positive for fatigue  Eyes: positive for blurred vision  Gastrointestinal: positive for abdominal pain, diarrhea, nausea and indigestion  Neurological: positive for dizziness and migraines  Blood pressure 109/80, pulse 91, temperature 98.8 F (37.1 C), temperature source Oral, resp. rate 16, height 5' 3" (1.6 m), weight 135 lb (61.2 kg), last menstrual period 10/08/2019, SpO2 97 %, currently breastfeeding.  Physical Exam  Vitals reviewed.  Constitutional:  Appearance: She is well-developed.  HENT:  Head: Normocephalic and atraumatic.  Eyes:  Extraocular Movements: Extraocular movements intact.  Pupils: Pupils are equal, round, and reactive to light.  Cardiovascular:  Rate and Rhythm: Normal rate and regular rhythm.  Pulmonary:  Effort: Pulmonary effort is normal.  Breath sounds: Normal breath sounds.  Abdominal:  General: There is no distension.  Palpations: Abdomen is soft.  Tenderness: There is no abdominal tenderness.  Hernia: No hernia is present.  Musculoskeletal:  Comments: Moves all extremities  Skin:  General: Skin is warm and dry.  Neurological:  General: No focal deficit present.  Mental Status: She is alert and oriented to person, place, and time.  Psychiatric:  Mood and Affect: Mood normal.  Behavior: Behavior normal.   Results:  CT a/p 10/23/19  Personally reviewed, stones in the gallbladder, no thickening or edema, wall enhances but does not appear  thick  IMPRESSION:  Cholelithiasis.  Moderate free fluid in the pelvis.  Results for Samaras, Jalilah R (MRN [id number]) as of 11/01/2019 13:59   Ref. Range 10/23/2019 17:30  COMPREHENSIVE METABOLIC PANEL Unknown Rpt (A)  Sodium Latest Ref Range: 135 - 145 mmol/L 139  Potassium Latest Ref Range: 3.5 - 5.1 mmol/L 3.7  Chloride Latest Ref Range: 98 - 111 mmol/L 103  CO2 Latest Ref Range: 22 - 32 mmol/L 27    Glucose Latest Ref Range: 70 - 99 mg/dL 105 (H)  BUN Latest Ref Range: 6 - 20 mg/dL 9  Creatinine Latest Ref Range: 0.44 - 1.00 mg/dL 0.57  Calcium Latest Ref Range: 8.9 - 10.3 mg/dL 9.4  Anion gap Latest Ref Range: 5 - 15  9  Alkaline Phosphatase Latest Ref Range: 38 - 126 U/L 55  Albumin Latest Ref Range: 3.5 - 5.0 g/dL 4.9  Lipase Latest Ref Range: 11 - 51 U/L 34  AST Latest Ref Range: 15 - 41 U/L 18  ALT Latest Ref Range: 0 - 44 U/L 17  Total Protein Latest Ref Range: 6.5 - 8.1 g/dL 8.7 (H)  Total Bilirubin Latest Ref Range: 0.3 - 1.2 mg/dL 0.4  GFR, Est Non African American Latest Ref Range: >60 mL/min >60  GFR, Est African American Latest Ref Range: >60 mL/min >60  WBC Latest Ref Range: 4.0 - 10.5 K/uL 10.2  RBC Latest Ref Range: 3.87 - 5.11 MIL/uL 5.19 (H)  Hemoglobin Latest Ref Range: 12.0 - 15.0 g/dL 16.6 (H)  HCT Latest Ref Range: 36.0 - 46.0 % 50.0 (H)  MCV Latest Ref Range: 80.0 - 100.0 fL 96.3  MCH Latest Ref Range: 26.0 - 34.0 pg 32.0  MCHC Latest Ref Range: 30.0 - 36.0 g/dL 33.2  RDW Latest Ref Range: 11.5 - 15.5 % 12.0  Platelets Latest Ref Range: 150 - 400 K/uL 357  nRBC Latest Ref Range: 0.0 - 0.2 % 0.0  Neutrophils Latest Units: % 76  Lymphocytes Latest Units: % 21  Monocytes Relative Latest Units: % 3  Eosinophil Latest Units: % 0  Basophil Latest Units: % 0  Immature Granulocytes Latest Units: % 0  NEUT# Latest Ref Range: 1.7 - 7.7 K/uL 7.8 (H)  Lymphocyte # Latest Ref Range: 0.7 - 4.0 K/uL 2.1  Monocyte # Latest Ref Range: 0.1 - 1.0 K/uL 0.3  Eosinophils  Absolute Latest Ref Range: 0.0 - 0.5 K/uL 0.0  Basophils Absolute Latest Ref Range: 0.0 - 0.1 K/uL 0.0  Abs Immature Granulocytes Latest Ref Range: 0.00 - 0.07 K/uL 0.03  Assessment & Plan:  Alajia R Dromgoole is a 33 y.o. female with gallstones that are symptomatic with some indigestion and upset stomach symptoms leading up to an acute episode of pain and nausea/vomiting and passing out. She says she is not eating much still and feels weak and fatigued. I discussed that some of her symptoms could be from the gallbladder but others are likely not from the gallbladder. It is difficult to know if all of the pain on the ED admission was from the gallbladder or if there was a ruptured cyst. We discussed that the weakness and fatigue are likely from something else, but that we cannot prove this or disprove that the gallbladder is contributing. We discussed that normal symptoms of gallbladder attacks are right upper quadrant pain, some nausea/vomiting and bloating, but that people do have atypical symptoms.  -PLAN: I counseled the patient about the indication, risks and benefits of laparoscopic cholecystectomy. She understands there is a very small chance for bleeding, infection, injury to normal structures (including common bile duct), conversion to open surgery, persistent symptoms, evolution of postcholecystectomy diarrhea, need for secondary interventions, anesthesia reaction, cardiopulmonary issues and other risks not specifically detailed here. I described the expected recovery, the plan for follow-up and the restrictions during the recovery phase. All questions were answered.  Discussed that not all of her symptoms may improve. She may need further work up and testing with   her PCP if she continues to have the fatigue / weakness.  Will get her surgery done in the upcoming weeks. Discussed that most people are only out of work for a few days, and she has a good support system at home to help with her children.   Kayman Snuffer C Idelia Caudell  10/29/2019, 10:11 AM   

## 2019-10-30 ENCOUNTER — Ambulatory Visit: Payer: BC Managed Care – PPO

## 2019-10-30 DIAGNOSIS — R519 Headache, unspecified: Secondary | ICD-10-CM | POA: Diagnosis not present

## 2019-10-30 DIAGNOSIS — R55 Syncope and collapse: Secondary | ICD-10-CM | POA: Diagnosis not present

## 2019-10-30 DIAGNOSIS — G8929 Other chronic pain: Secondary | ICD-10-CM | POA: Diagnosis not present

## 2019-10-30 MED ORDER — GADOBENATE DIMEGLUMINE 529 MG/ML IV SOLN
10.0000 mL | Freq: Once | INTRAVENOUS | Status: AC | PRN
  Administered 2019-10-30: 10 mL via INTRAVENOUS

## 2019-10-30 NOTE — Telephone Encounter (Signed)
Received from [facility] denial for nurtec.  Needs to try 2 triptans (has only tried rizatriptan).   REF # [id number].

## 2019-11-01 DIAGNOSIS — K8020 Calculus of gallbladder without cholecystitis without obstruction: Secondary | ICD-10-CM

## 2019-11-01 NOTE — H&P (Signed)
[facility] History and Physical  Reason for Referral: Gallstones  Referring Physician: ED     Chief Complaint     Abdominal Pain      Timothea R Rump is a 33 y.o. female.  HPI: Ms. Lyall is a very sweet 33 yo who comes in with a recent ED visit with some right sided pain that was RUQ and RLQ. She had imaging done that demonstrated stones in her gallbladder, no obvious signs of cholecystitis, and some free fluid in the pelvis possibly from a ruptured cyst. She said that prior to this episode she really had never had such severe pain. She says that the pain was sudden onset, sharp in nature, and was associated with diarrhea and vomiting. She actually passed out and had to get EMS to take her to the ED. She was able to get some relief after getting pain medication in the ED. She says that now that she thinks about it that she has had upset stomach and indigestion issues for a while, and wonders if this was related to her gallbladder. She has been having issues with migraines and has been on different medicines, and she says that she is just overall tired and weak. She has two children at home and stays at home with them.      Past Medical History:  Diagnosis Date  . Anxiety   . Depression    denies  . Endometriosis   . Hx of varicella   . IBS (irritable bowel syndrome)   . Infection    UTI  . Migraines    with aura        Past Surgical History:  Procedure Laterality Date  . LAPAROSCOPY     with chromopertubation  . OTHER SURGICAL HISTORY     dx lap for endometriosis        Family History  Problem Relation Age of Onset  . Fibromyalgia Mother   . Diabetes Mother    borderline  . Depression Father   . Diabetes Father    type 2  . Anxiety disorder Sister   . Bipolar disorder Maternal Aunt   . Cancer Maternal Grandmother    mouth and lung  . Bipolar disorder Maternal Grandfather   . Stroke Maternal Grandfather   . Heart disease Paternal Grandfather     Social History       Tobacco Use  . Smoking status: Never Smoker  . Smokeless tobacco: Never Used  Substance Use Topics  . Alcohol use: No  . Drug use: No   Medications: I have reviewed the patient's current medications.       Allergies as of 10/29/2019      Reactions   Hydrocodone-homatropine Nausea And Vomiting, Rash   Hycodan    Percocet [oxycodone-acetaminophen] Hives, Rash           Medication List       Accurate as of October 29, 2019 10:11 AM. If you have any questions, ask your nurse or doctor.        citalopram 20 MG tablet  Commonly known as: CELEXA  Take one tablet po every morning  What changed:  how much to take  how to take this  when to take this  additional instructions  clonazePAM 0.5 MG tablet  Commonly known as: KlonoPIN  Take 1/2-1 tablet po BID prn anxiety  What changed:  how much to take  how to take this  when to take this  reasons to  take this  additional instructions  Erenumab-aooe 140 MG/ML Soaj  Inject 140 mg into the skin every 30 (thirty) days.   ketorolac 10 MG tablet  Commonly known as: TORADOL  Take 1 tablet (10 mg total) by mouth every 6 (six) hours as needed.  What changed: additional instructions   norethindrone 0.35 MG tablet  Commonly known as: MICRONOR  Take 1 tablet by mouth at bedtime.   Nurtec 75 MG Tbdp  Generic drug: Rimegepant Sulfate  Take 75 mg by mouth daily as needed. Take at the onset of migraine.1 tablet/24 hours   ondansetron 8 MG disintegrating tablet  Commonly known as: ZOFRAN-ODT  Take 1 tablet (8 mg total) by mouth every 8 (eight) hours as needed for nausea or vomiting.   predniSONE 5 MG tablet  Commonly known as: DELTASONE  Begin taking 6 tablets daily, taper by one tablet daily until off the medication.   rizatriptan 10 MG disintegrating tablet  Commonly known as: MAXALT-MLT  Take 1 tablet (10 mg total) by mouth as needed for migraine. May repeat in 2 hours if needed; Max 2 pills in 24  hours   tiZANidine 4 MG tablet  Commonly known as: Zanaflex  Take 1 tablet (4 mg total) by mouth 3 (three) times daily as needed (migraine headache). Home use only-caution drowsiness   traMADol 50 MG tablet  Commonly known as: ULTRAM  Take 1 tablet (50 mg total) by mouth every 6 (six) hours as needed.   traMADol 50 MG tablet  Commonly known as: ULTRAM  Take 1 tablet (50 mg total) by mouth every 6 (six) hours as needed.       ROS:  A comprehensive review of systems was negative except for: Constitutional: positive for fatigue  Eyes: positive for blurred vision  Gastrointestinal: positive for abdominal pain, diarrhea, nausea and indigestion  Neurological: positive for dizziness and migraines  Blood pressure 109/80, pulse 91, temperature 98.8 F (37.1 C), temperature source Oral, resp. rate 16, height 5\' 3"  (1.6 m), weight 135 lb (61.2 kg), last menstrual period 10/08/2019, SpO2 97 %, currently breastfeeding.  Physical Exam  Vitals reviewed.  Constitutional:  Appearance: She is well-developed.  HENT:  Head: Normocephalic and atraumatic.  Eyes:  Extraocular Movements: Extraocular movements intact.  Pupils: Pupils are equal, round, and reactive to light.  Cardiovascular:  Rate and Rhythm: Normal rate and regular rhythm.  Pulmonary:  Effort: Pulmonary effort is normal.  Breath sounds: Normal breath sounds.  Abdominal:  General: There is no distension.  Palpations: Abdomen is soft.  Tenderness: There is no abdominal tenderness.  Hernia: No hernia is present.  Musculoskeletal:  Comments: Moves all extremities  Skin:  General: Skin is warm and dry.  Neurological:  General: No focal deficit present.  Mental Status: She is alert and oriented to person, place, and time.  Psychiatric:  Mood and Affect: Mood normal.  Behavior: Behavior normal.   Results:  CT a/p 10/23/19  Personally reviewed, stones in the gallbladder, no thickening or edema, wall enhances but does not appear  thick  IMPRESSION:  Cholelithiasis.  Moderate free fluid in the pelvis.  Results for Sissy HoffMMONS, Serrena R (MRN [id number]) as of 11/01/2019 13:59   Ref. Range 10/23/2019 17:30  COMPREHENSIVE METABOLIC PANEL Unknown Rpt (A)  Sodium Latest Ref Range: 135 - 145 mmol/L 139  Potassium Latest Ref Range: 3.5 - 5.1 mmol/L 3.7  Chloride Latest Ref Range: 98 - 111 mmol/L 103  CO2 Latest Ref Range: 22 - 32 mmol/L 27  Glucose Latest Ref Range: 70 - 99 mg/dL 105 (H)  BUN Latest Ref Range: 6 - 20 mg/dL 9  Creatinine Latest Ref Range: 0.44 - 1.00 mg/dL 0.57  Calcium Latest Ref Range: 8.9 - 10.3 mg/dL 9.4  Anion gap Latest Ref Range: 5 - 15  9  Alkaline Phosphatase Latest Ref Range: 38 - 126 U/L 55  Albumin Latest Ref Range: 3.5 - 5.0 g/dL 4.9  Lipase Latest Ref Range: 11 - 51 U/L 34  AST Latest Ref Range: 15 - 41 U/L 18  ALT Latest Ref Range: 0 - 44 U/L 17  Total Protein Latest Ref Range: 6.5 - 8.1 g/dL 8.7 (H)  Total Bilirubin Latest Ref Range: 0.3 - 1.2 mg/dL 0.4  GFR, Est Non African American Latest Ref Range: >60 mL/min >60  GFR, Est African American Latest Ref Range: >60 mL/min >60  WBC Latest Ref Range: 4.0 - 10.5 K/uL 10.2  RBC Latest Ref Range: 3.87 - 5.11 MIL/uL 5.19 (H)  Hemoglobin Latest Ref Range: 12.0 - 15.0 g/dL 16.6 (H)  HCT Latest Ref Range: 36.0 - 46.0 % 50.0 (H)  MCV Latest Ref Range: 80.0 - 100.0 fL 96.3  MCH Latest Ref Range: 26.0 - 34.0 pg 32.0  MCHC Latest Ref Range: 30.0 - 36.0 g/dL 33.2  RDW Latest Ref Range: 11.5 - 15.5 % 12.0  Platelets Latest Ref Range: 150 - 400 K/uL 357  nRBC Latest Ref Range: 0.0 - 0.2 % 0.0  Neutrophils Latest Units: % 76  Lymphocytes Latest Units: % 21  Monocytes Relative Latest Units: % 3  Eosinophil Latest Units: % 0  Basophil Latest Units: % 0  Immature Granulocytes Latest Units: % 0  NEUT# Latest Ref Range: 1.7 - 7.7 K/uL 7.8 (H)  Lymphocyte # Latest Ref Range: 0.7 - 4.0 K/uL 2.1  Monocyte # Latest Ref Range: 0.1 - 1.0 K/uL 0.3  Eosinophils  Absolute Latest Ref Range: 0.0 - 0.5 K/uL 0.0  Basophils Absolute Latest Ref Range: 0.0 - 0.1 K/uL 0.0  Abs Immature Granulocytes Latest Ref Range: 0.00 - 0.07 K/uL 0.03  Assessment & Plan:  AADHIRA HEFFERNAN is a 33 y.o. female with gallstones that are symptomatic with some indigestion and upset stomach symptoms leading up to an acute episode of pain and nausea/vomiting and passing out. She says she is not eating much still and feels weak and fatigued. I discussed that some of her symptoms could be from the gallbladder but others are likely not from the gallbladder. It is difficult to know if all of the pain on the ED admission was from the gallbladder or if there was a ruptured cyst. We discussed that the weakness and fatigue are likely from something else, but that we cannot prove this or disprove that the gallbladder is contributing. We discussed that normal symptoms of gallbladder attacks are right upper quadrant pain, some nausea/vomiting and bloating, but that people do have atypical symptoms.  -PLAN: I counseled the patient about the indication, risks and benefits of laparoscopic cholecystectomy. She understands there is a very small chance for bleeding, infection, injury to normal structures (including common bile duct), conversion to open surgery, persistent symptoms, evolution of postcholecystectomy diarrhea, need for secondary interventions, anesthesia reaction, cardiopulmonary issues and other risks not specifically detailed here. I described the expected recovery, the plan for follow-up and the restrictions during the recovery phase. All questions were answered.  Discussed that not all of her symptoms may improve. She may need further work up and testing with  her PCP if she continues to have the fatigue / weakness.  Will get her surgery done in the upcoming weeks. Discussed that most people are only out of work for a few days, and she has a good support system at home to help with her children.   [name]  10/29/2019, 10:11 AM

## 2019-11-05 ENCOUNTER — Encounter: Payer: Self-pay | Admitting: *Deleted

## 2019-11-12 NOTE — Telephone Encounter (Signed)
Please make patient aware. Can inquire if she has tired another triptan in the past- if not we can consider trying a different triptan .

## 2019-11-12 NOTE — Telephone Encounter (Signed)
I called pharmacy and she was able to get nurtec thru her insurance.

## 2019-11-12 NOTE — Telephone Encounter (Signed)
Tried to call pharmacy and system busy.  Will try later.

## 2019-11-13 NOTE — Patient Instructions (Signed)
Stacey Compton  11/13/2019     @PREFPERIOPPHARMACY @   Your procedure is scheduled on  11/18/2019.  Report to 01/16/2020 at  [phone] A.M.  Call this number if you have problems the morning of surgery:  [phone]   Remember:  Do not eat or drink after midnight.                       Take these medicines the morning of surgery with A SIP OF WATER  Clonazepam, zofran(if needed), rimegepant(if needed), tramadol(if needed)    Do not wear jewelry, make-up or nail polish.  Do not wear lotions, powders, or perfumes. Please wear deodorant and brush your teeth.  Do not shave 48 hours prior to surgery.  Men may shave face and neck.  Do not bring valuables to the hospital.  [facility] is not responsible for any belongings or valuables.  Contacts, dentures or bridgework may not be worn into surgery.  Leave your suitcase in the car.  After surgery it may be brought to your room.  For patients admitted to the hospital, discharge time will be determined by your treatment team.  Patients discharged the day of surgery will not be allowed to drive home.   Name and phone number of your driver:   family Special instructions:  None  Please read over the following fact sheets that you were given. Anesthesia Post-op Instructions and Care and Recovery After Surgery       Laparoscopic Cholecystectomy, Care After This sheet gives you information about how to care for yourself after your procedure. Your health care provider may also give you more specific instructions. If you have problems or questions, contact your health care provider. What can I expect after the procedure? After the procedure, it is common to have:  Pain at your incision sites. You will be given medicines to control this pain.  Mild nausea or vomiting.  Bloating and possible shoulder pain from the air-like gas that was used during the procedure. Follow these instructions at home: Incision care   Follow  instructions from your health care provider about how to take care of your incisions. Make sure you: ? Wash your hands with soap and water before you change your bandage (dressing). If soap and water are not available, use hand sanitizer. ? Change your dressing as told by your health care provider. ? Leave stitches (sutures), skin glue, or adhesive strips in place. These skin closures may need to be in place for 2 weeks or longer. If adhesive strip edges start to loosen and curl up, you may trim the loose edges. Do not remove adhesive strips completely unless your health care provider tells you to do that.  Do not take baths, swim, or use a hot tub until your health care provider approves. Ask your health care provider if you can take showers. You may only be allowed to take sponge baths for bathing.  Check your incision area every day for signs of infection. Check for: ? More redness, swelling, or pain. ? More fluid or blood. ? Warmth. ? Pus or a bad smell. Activity  Do not drive or use heavy machinery while taking prescription pain medicine.  Do not lift anything that is heavier than 10 lb (4.5 kg) until your health care provider approves.  Do not play contact sports until your health care provider approves.  Do not drive for 24 hours if you  were given a medicine to help you relax (sedative).  Rest as needed. Do not return to work or school until your health care provider approves. General instructions  Take over-the-counter and prescription medicines only as told by your health care provider.  To prevent or treat constipation while you are taking prescription pain medicine, your health care provider may recommend that you: ? Drink enough fluid to keep your urine clear or pale yellow. ? Take over-the-counter or prescription medicines. ? Eat foods that are high in fiber, such as fresh fruits and vegetables, whole grains, and beans. ? Limit foods that are high in fat and processed  sugars, such as fried and sweet foods. Contact a health care provider if:  You develop a rash.  You have more redness, swelling, or pain around your incisions.  You have more fluid or blood coming from your incisions.  Your incisions feel warm to the touch.  You have pus or a bad smell coming from your incisions.  You have a fever.  One or more of your incisions breaks open. Get help right away if:  You have trouble breathing.  You have chest pain.  You have increasing pain in your shoulders.  You faint or feel dizzy when you stand.  You have severe pain in your abdomen.  You have nausea or vomiting that lasts for more than one day.  You have leg pain. This information is not intended to replace advice given to you by your health care provider. Make sure you discuss any questions you have with your health care provider. Document Revised: 10/06/2017 Document Reviewed: 04/11/2016 Elsevier Patient Education  2020 [employer]  General Anesthesia, Adult, Care After This sheet gives you information about how to care for yourself after your procedure. Your health care provider may also give you more specific instructions. If you have problems or questions, contact your health care provider. What can I expect after the procedure? After the procedure, the following side effects are common:  Pain or discomfort at the IV site.  Nausea.  Vomiting.  Sore throat.  Trouble concentrating.  Feeling cold or chills.  Weak or tired.  Sleepiness and fatigue.  Soreness and body aches. These side effects can affect parts of the body that were not involved in surgery. Follow these instructions at home:  For at least 24 hours after the procedure:  Have a responsible adult stay with you. It is important to have someone help care for you until you are awake and alert.  Rest as needed.  Do not: ? Participate in activities in which you could fall or become injured. ? Drive.  ? Use heavy machinery. ? Drink alcohol. ? Take sleeping pills or medicines that cause drowsiness. ? Make important decisions or sign legal documents. ? Take care of children on your own. Eating and drinking  Follow any instructions from your health care provider about eating or drinking restrictions.  When you feel hungry, start by eating small amounts of foods that are soft and easy to digest (bland), such as toast. Gradually return to your regular diet.  Drink enough fluid to keep your urine pale yellow.  If you vomit, rehydrate by drinking water, juice, or clear broth. General instructions  If you have sleep apnea, surgery and certain medicines can increase your risk for breathing problems. Follow instructions from your health care provider about wearing your sleep device: ? Anytime you are sleeping, including during daytime naps. ? While taking prescription pain medicines, sleeping  medicines, or medicines that make you drowsy.  Return to your normal activities as told by your health care provider. Ask your health care provider what activities are safe for you.  Take over-the-counter and prescription medicines only as told by your health care provider.  If you smoke, do not smoke without supervision.  Keep all follow-up visits as told by your health care provider. This is important. Contact a health care provider if:  You have nausea or vomiting that does not get better with medicine.  You cannot eat or drink without vomiting.  You have pain that does not get better with medicine.  You are unable to pass urine.  You develop a skin rash.  You have a fever.  You have redness around your IV site that gets worse. Get help right away if:  You have difficulty breathing.  You have chest pain.  You have blood in your urine or stool, or you vomit blood. Summary  After the procedure, it is common to have a sore throat or nausea. It is also common to feel tired.  Have a  responsible adult stay with you for the first 24 hours after general anesthesia. It is important to have someone help care for you until you are awake and alert.  When you feel hungry, start by eating small amounts of foods that are soft and easy to digest (bland), such as toast. Gradually return to your regular diet.  Drink enough fluid to keep your urine pale yellow.  Return to your normal activities as told by your health care provider. Ask your health care provider what activities are safe for you. This information is not intended to replace advice given to you by your health care provider. Make sure you discuss any questions you have with your health care provider. Document Revised: 10/27/2017 Document Reviewed: 06/09/2017 Elsevier Patient Education  2020 [employer] How to Use Chlorhexidine for Bathing Chlorhexidine gluconate (CHG) is a germ-killing (antiseptic) solution that is used to clean the skin. It can get rid of the bacteria that normally live on the skin and can keep them away for about 24 hours. To clean your skin with CHG, you may be given:  A CHG solution to use in the shower or as part of a sponge bath.  A prepackaged cloth that contains CHG. Cleaning your skin with CHG may help lower the risk for infection:  While you are staying in the intensive care unit of the hospital.  If you have a vascular access, such as a central line, to provide short-term or long-term access to your veins.  If you have a catheter to drain urine from your bladder.  If you are on a ventilator. A ventilator is a machine that helps you breathe by moving air in and out of your lungs.  After surgery. What are the risks? Risks of using CHG include:  A skin reaction.  Hearing loss, if CHG gets in your ears.  Eye injury, if CHG gets in your eyes and is not rinsed out.  The CHG product catching fire. Make sure that you avoid smoking and flames after applying CHG to your skin. Do not use  CHG:  If you have a chlorhexidine allergy or have previously reacted to chlorhexidine.  On babies younger than 392 months of age. How to use CHG solution  Use CHG only as told by your health care provider, and follow the instructions on the label.  Use the full amount of CHG as  directed. Usually, this is one bottle. During a shower Follow these steps when using CHG solution during a shower (unless your health care provider gives you different instructions): 1. Start the shower. 2. Use your normal soap and shampoo to wash your face and hair. 3. Turn off the shower or move out of the shower stream. 4. Pour the CHG onto a clean washcloth. Do not use any type of brush or rough-edged sponge. 5. Starting at your neck, lather your body down to your toes. Make sure you follow these instructions: ? If you will be having surgery, pay special attention to the part of your body where you will be having surgery. Scrub this area for at least 1 minute. ? Do not use CHG on your head or face. If the solution gets into your ears or eyes, rinse them well with water. ? Avoid your genital area. ? Avoid any areas of skin that have broken skin, cuts, or scrapes. ? Scrub your back and under your arms. Make sure to wash skin folds. 6. Let the lather sit on your skin for 1-2 minutes or as long as told by your health care provider. 7. Thoroughly rinse your entire body in the shower. Make sure that all body creases and crevices are rinsed well. 8. Dry off with a clean towel. Do not put any substances on your body afterward-such as powder, lotion, or perfume-unless you are told to do so by your health care provider. Only use lotions that are recommended by the manufacturer. 9. Put on clean clothes or pajamas. 10. If it is the night before your surgery, sleep in clean sheets.  During a sponge bath Follow these steps when using CHG solution during a sponge bath (unless your health care provider gives you different  instructions): 1. Use your normal soap and shampoo to wash your face and hair. 2. Pour the CHG onto a clean washcloth. 3. Starting at your neck, lather your body down to your toes. Make sure you follow these instructions: ? If you will be having surgery, pay special attention to the part of your body where you will be having surgery. Scrub this area for at least 1 minute. ? Do not use CHG on your head or face. If the solution gets into your ears or eyes, rinse them well with water. ? Avoid your genital area. ? Avoid any areas of skin that have broken skin, cuts, or scrapes. ? Scrub your back and under your arms. Make sure to wash skin folds. 4. Let the lather sit on your skin for 1-2 minutes or as long as told by your health care provider. 5. Using a different clean, wet washcloth, thoroughly rinse your entire body. Make sure that all body creases and crevices are rinsed well. 6. Dry off with a clean towel. Do not put any substances on your body afterward-such as powder, lotion, or perfume-unless you are told to do so by your health care provider. Only use lotions that are recommended by the manufacturer. 7. Put on clean clothes or pajamas. 8. If it is the night before your surgery, sleep in clean sheets. How to use CHG prepackaged cloths  Only use CHG cloths as told by your health care provider, and follow the instructions on the label.  Use the CHG cloth on clean, dry skin.  Do not use the CHG cloth on your head or face unless your health care provider tells you to.  When washing with the CHG cloth: ? Avoid  your genital area. ? Avoid any areas of skin that have broken skin, cuts, or scrapes. Before surgery Follow these steps when using a CHG cloth to clean before surgery (unless your health care provider gives you different instructions): 1. Using the CHG cloth, vigorously scrub the part of your body where you will be having surgery. Scrub using a back-and-forth motion for 3 minutes. The  area on your body should be completely wet with CHG when you are done scrubbing. 2. Do not rinse. Discard the cloth and let the area air-dry. Do not put any substances on the area afterward, such as powder, lotion, or perfume. 3. Put on clean clothes or pajamas. 4. If it is the night before your surgery, sleep in clean sheets.  For general bathing Follow these steps when using CHG cloths for general bathing (unless your health care provider gives you different instructions). 1. Use a separate CHG cloth for each area of your body. Make sure you wash between any folds of skin and between your fingers and toes. Wash your body in the following order, switching to a new cloth after each step: ? The front of your neck, shoulders, and chest. ? Both of your arms, under your arms, and your hands. ? Your stomach and groin area, avoiding the genitals. ? Your right leg and foot. ? Your left leg and foot. ? The back of your neck, your back, and your buttocks. 2. Do not rinse. Discard the cloth and let the area air-dry. Do not put any substances on your body afterward-such as powder, lotion, or perfume-unless you are told to do so by your health care provider. Only use lotions that are recommended by the manufacturer. 3. Put on clean clothes or pajamas. Contact a health care provider if:  Your skin gets irritated after scrubbing.  You have questions about using your solution or cloth. Get help right away if:  Your eyes become very red or swollen.  Your eyes itch badly.  Your skin itches badly and is red or swollen.  Your hearing changes.  You have trouble seeing.  You have swelling or tingling in your mouth or throat.  You have trouble breathing.  You swallow any chlorhexidine. Summary  Chlorhexidine gluconate (CHG) is a germ-killing (antiseptic) solution that is used to clean the skin. Cleaning your skin with CHG may help to lower your risk for infection.  You may be given CHG to use for  bathing. It may be in a bottle or in a prepackaged cloth to use on your skin. Carefully follow your health care provider's instructions and the instructions on the product label.  Do not use CHG if you have a chlorhexidine allergy.  Contact your health care provider if your skin gets irritated after scrubbing. This information is not intended to replace advice given to you by your health care provider. Make sure you discuss any questions you have with your health care provider. Document Revised: 01/10/2019 Document Reviewed: 09/21/2017 Elsevier Patient Education  2020 [employer].

## 2019-11-14 ENCOUNTER — Encounter (HOSPITAL_COMMUNITY)
Admission: RE | Admit: 2019-11-14 | Discharge: 2019-11-14 | Disposition: A | Discharge status: Home / Self Care | Payer: 59 | Source: Ambulatory Visit | Attending: General Surgery | Admitting: General Surgery

## 2019-11-14 ENCOUNTER — Other Ambulatory Visit: Payer: Self-pay

## 2019-11-14 ENCOUNTER — Encounter (HOSPITAL_COMMUNITY): Payer: Self-pay

## 2019-11-14 ENCOUNTER — Other Ambulatory Visit: Payer: 59

## 2019-11-14 DIAGNOSIS — Z01812 Encounter for preprocedural laboratory examination: Secondary | ICD-10-CM | POA: Insufficient documentation

## 2019-11-14 HISTORY — DX: Anemia, unspecified: D64.9

## 2019-11-14 HISTORY — DX: Nausea with vomiting, unspecified: R11.2

## 2019-11-14 HISTORY — DX: Other specified postprocedural states: Z98.890

## 2019-11-14 LAB — HCG, SERUM, QUALITATIVE: Preg, Serum: NEGATIVE

## 2019-11-15 ENCOUNTER — Other Ambulatory Visit: Payer: 59

## 2019-11-15 ENCOUNTER — Other Ambulatory Visit (HOSPITAL_COMMUNITY)
Admission: RE | Admit: 2019-11-15 | Discharge: 2019-11-15 | Disposition: A | Discharge status: Home / Self Care | Payer: 59 | Source: Ambulatory Visit | Attending: General Surgery | Admitting: General Surgery

## 2019-11-15 ENCOUNTER — Encounter: Payer: Self-pay | Admitting: General Surgery

## 2019-11-15 DIAGNOSIS — Z01812 Encounter for preprocedural laboratory examination: Secondary | ICD-10-CM | POA: Diagnosis present

## 2019-11-15 DIAGNOSIS — Z20822 Contact with and (suspected) exposure to covid-19: Secondary | ICD-10-CM | POA: Insufficient documentation

## 2019-11-15 LAB — SARS CORONAVIRUS 2 (TAT 6-24 HRS): SARS Coronavirus 2: NEGATIVE

## 2019-11-18 ENCOUNTER — Encounter (HOSPITAL_COMMUNITY)
Admission: RE | Disposition: A | Discharge status: Home / Self Care | Payer: Self-pay | Source: Home / Self Care | Attending: General Surgery

## 2019-11-18 ENCOUNTER — Ambulatory Visit (HOSPITAL_COMMUNITY): Payer: 59 | Admitting: Anesthesiology

## 2019-11-18 ENCOUNTER — Encounter (HOSPITAL_COMMUNITY): Payer: Self-pay | Admitting: General Surgery

## 2019-11-18 ENCOUNTER — Ambulatory Visit (HOSPITAL_COMMUNITY)
Admission: RE | Admit: 2019-11-18 | Discharge: 2019-11-18 | Disposition: A | Discharge status: Home / Self Care | Payer: 59 | Attending: General Surgery | Admitting: General Surgery

## 2019-11-18 DIAGNOSIS — K8010 Calculus of gallbladder with chronic cholecystitis without obstruction: Secondary | ICD-10-CM | POA: Diagnosis not present

## 2019-11-18 DIAGNOSIS — K8020 Calculus of gallbladder without cholecystitis without obstruction: Secondary | ICD-10-CM | POA: Diagnosis not present

## 2019-11-18 DIAGNOSIS — Z79899 Other long term (current) drug therapy: Secondary | ICD-10-CM | POA: Insufficient documentation

## 2019-11-18 DIAGNOSIS — K589 Irritable bowel syndrome without diarrhea: Secondary | ICD-10-CM | POA: Diagnosis not present

## 2019-11-18 DIAGNOSIS — Z885 Allergy status to narcotic agent status: Secondary | ICD-10-CM | POA: Diagnosis not present

## 2019-11-18 DIAGNOSIS — F419 Anxiety disorder, unspecified: Secondary | ICD-10-CM | POA: Diagnosis not present

## 2019-11-18 DIAGNOSIS — G43109 Migraine with aura, not intractable, without status migrainosus: Secondary | ICD-10-CM | POA: Insufficient documentation

## 2019-11-18 HISTORY — PX: CHOLECYSTECTOMY: SHX55

## 2019-11-18 SURGERY — LAPAROSCOPIC CHOLECYSTECTOMY
Anesthesia: General | Site: Abdomen

## 2019-11-18 MED ORDER — BUPIVACAINE HCL (PF) 0.5 % IJ SOLN
INTRAMUSCULAR | Status: AC
  Filled 2019-11-18: qty 30

## 2019-11-18 MED ORDER — ROCURONIUM BROMIDE 100 MG/10ML IV SOLN
INTRAVENOUS | Status: DC | PRN
  Administered 2019-11-18: 30 mg via INTRAVENOUS

## 2019-11-18 MED ORDER — SODIUM CHLORIDE 0.9 % IR SOLN
Status: DC | PRN
  Administered 2019-11-18: 1000 mL

## 2019-11-18 MED ORDER — LIDOCAINE HCL (CARDIAC) PF 100 MG/5ML IV SOSY
PREFILLED_SYRINGE | INTRAVENOUS | Status: DC | PRN
  Administered 2019-11-18: 40 mg via INTRAVENOUS

## 2019-11-18 MED ORDER — SODIUM CHLORIDE 0.9 % IV SOLN
2.0000 g | INTRAVENOUS | Status: AC
  Administered 2019-11-18: 2 g via INTRAVENOUS
  Filled 2019-11-18: qty 2

## 2019-11-18 MED ORDER — DEXAMETHASONE SODIUM PHOSPHATE 10 MG/ML IJ SOLN
INTRAMUSCULAR | Status: AC
  Filled 2019-11-18: qty 1

## 2019-11-18 MED ORDER — ONDANSETRON HCL 4 MG/2ML IJ SOLN
INTRAMUSCULAR | Status: AC
  Filled 2019-11-18: qty 2

## 2019-11-18 MED ORDER — DEXAMETHASONE SODIUM PHOSPHATE 4 MG/ML IJ SOLN
INTRAMUSCULAR | Status: DC | PRN
  Administered 2019-11-18: 10 mg via INTRAVENOUS

## 2019-11-18 MED ORDER — LIDOCAINE 2% (20 MG/ML) 5 ML SYRINGE
INTRAMUSCULAR | Status: AC
  Filled 2019-11-18: qty 5

## 2019-11-18 MED ORDER — HYDROMORPHONE HCL 1 MG/ML IJ SOLN
INTRAMUSCULAR | Status: AC
  Filled 2019-11-18: qty 0.5

## 2019-11-18 MED ORDER — SCOPOLAMINE 1 MG/3DAYS TD PT72
MEDICATED_PATCH | TRANSDERMAL | Status: DC | PRN
  Administered 2019-11-18: 1 via TRANSDERMAL

## 2019-11-18 MED ORDER — SUCCINYLCHOLINE CHLORIDE 20 MG/ML IJ SOLN
INTRAMUSCULAR | Status: DC | PRN
  Administered 2019-11-18: 120 mg via INTRAVENOUS

## 2019-11-18 MED ORDER — PROPOFOL 10 MG/ML IV BOLUS
INTRAVENOUS | Status: AC
  Filled 2019-11-18: qty 40

## 2019-11-18 MED ORDER — LACTATED RINGERS IV SOLN: INTRAVENOUS | Status: DC

## 2019-11-18 MED ORDER — SCOPOLAMINE 1 MG/3DAYS TD PT72
MEDICATED_PATCH | TRANSDERMAL | Status: AC
  Filled 2019-11-18: qty 1

## 2019-11-18 MED ORDER — SODIUM CHLORIDE FLUSH 0.9 % IV SOLN
INTRAVENOUS | Status: AC
  Filled 2019-11-18: qty 10

## 2019-11-18 MED ORDER — TRAMADOL HCL 50 MG PO TABS: 50.0000 mg | ORAL_TABLET | Freq: Four times a day (QID) | ORAL | 0 refills | Status: DC | PRN

## 2019-11-18 MED ORDER — CHLORHEXIDINE GLUCONATE CLOTH 2 % EX PADS: 6.0000 | MEDICATED_PAD | Freq: Once | CUTANEOUS | Status: DC

## 2019-11-18 MED ORDER — PROMETHAZINE HCL 25 MG/ML IJ SOLN
6.2500 mg | INTRAMUSCULAR | Status: DC | PRN
  Administered 2019-11-18: 09:00:00 12.5 mg via INTRAVENOUS
  Filled 2019-11-18: qty 1

## 2019-11-18 MED ORDER — ONDANSETRON HCL 4 MG/2ML IJ SOLN
INTRAMUSCULAR | Status: DC | PRN
  Administered 2019-11-18: 4 mg via INTRAVENOUS

## 2019-11-18 MED ORDER — SUCCINYLCHOLINE CHLORIDE 200 MG/10ML IV SOSY
PREFILLED_SYRINGE | INTRAVENOUS | Status: AC
  Filled 2019-11-18: qty 20

## 2019-11-18 MED ORDER — DOCUSATE SODIUM 100 MG PO CAPS: 100.0000 mg | ORAL_CAPSULE | Freq: Two times a day (BID) | ORAL | 2 refills | Status: DC | PRN

## 2019-11-18 MED ORDER — BUPIVACAINE HCL (PF) 0.5 % IJ SOLN
INTRAMUSCULAR | Status: DC | PRN
  Administered 2019-11-18: 10 mL

## 2019-11-18 MED ORDER — HEMOSTATIC AGENTS (NO CHARGE) OPTIME
TOPICAL | Status: DC | PRN
  Administered 2019-11-18: 1 via TOPICAL

## 2019-11-18 MED ORDER — MIDAZOLAM HCL 2 MG/2ML IJ SOLN: 0.5000 mg | Freq: Once | INTRAMUSCULAR | Status: DC | PRN

## 2019-11-18 MED ORDER — FENTANYL CITRATE (PF) 100 MCG/2ML IJ SOLN
INTRAMUSCULAR | Status: DC | PRN
  Administered 2019-11-18 (×5): 50 ug via INTRAVENOUS

## 2019-11-18 MED ORDER — GLYCOPYRROLATE PF 0.2 MG/ML IJ SOSY
PREFILLED_SYRINGE | INTRAMUSCULAR | Status: DC | PRN
  Administered 2019-11-18: .2 mg via INTRAVENOUS

## 2019-11-18 MED ORDER — PROPOFOL 10 MG/ML IV BOLUS
INTRAVENOUS | Status: DC | PRN
  Administered 2019-11-18: 150 mg via INTRAVENOUS

## 2019-11-18 MED ORDER — GLYCOPYRROLATE PF 0.2 MG/ML IJ SOSY
PREFILLED_SYRINGE | INTRAMUSCULAR | Status: AC
  Filled 2019-11-18: qty 1

## 2019-11-18 MED ORDER — SUGAMMADEX SODIUM 200 MG/2ML IV SOLN
INTRAVENOUS | Status: DC | PRN
  Administered 2019-11-18: 122.4 mg via INTRAVENOUS

## 2019-11-18 MED ORDER — HYDROMORPHONE HCL 1 MG/ML IJ SOLN
0.5000 mg | INTRAMUSCULAR | Status: DC | PRN
  Administered 2019-11-18 (×2): 0.5 mg via INTRAVENOUS

## 2019-11-18 MED ORDER — ROCURONIUM BROMIDE 10 MG/ML (PF) SYRINGE
PREFILLED_SYRINGE | INTRAVENOUS | Status: AC
  Filled 2019-11-18: qty 10

## 2019-11-18 MED ORDER — FENTANYL CITRATE (PF) 250 MCG/5ML IJ SOLN
INTRAMUSCULAR | Status: AC
  Filled 2019-11-18: qty 5

## 2019-11-18 MED ORDER — ONDANSETRON 8 MG PO TBDP: 8.0000 mg | ORAL_TABLET | Freq: Three times a day (TID) | ORAL | 0 refills | Status: DC | PRN

## 2019-11-18 SURGICAL SUPPLY — 46 items
ADH SKN CLS APL DERMABOND .7 (GAUZE/BANDAGES/DRESSINGS) ×1
APL PRP STRL LF DISP 70% ISPRP (MISCELLANEOUS) ×1
APPLIER CLIP ROT 10 11.4 M/L (STAPLE) ×2
APR CLP MED LRG 11.4X10 (STAPLE) ×1
BAG RETRIEVAL 10 (BASKET) ×1
BLADE SURG 15 STRL LF DISP TIS (BLADE) ×1 IMPLANT
BLADE SURG 15 STRL SS (BLADE) ×2
CHLORAPREP W/TINT 26 (MISCELLANEOUS) ×2 IMPLANT
CLIP APPLIE ROT 10 11.4 M/L (STAPLE) ×1 IMPLANT
CLOTH BEACON ORANGE TIMEOUT ST (SAFETY) ×2 IMPLANT
COVER LIGHT HANDLE STERIS (MISCELLANEOUS) ×4 IMPLANT
COVER WAND RF STERILE (DRAPES) ×2 IMPLANT
DECANTER SPIKE VIAL GLASS SM (MISCELLANEOUS) ×2 IMPLANT
DERMABOND ADVANCED (GAUZE/BANDAGES/DRESSINGS) ×1
DERMABOND ADVANCED .7 DNX12 (GAUZE/BANDAGES/DRESSINGS) ×1 IMPLANT
ELECT REM PT RETURN 9FT ADLT (ELECTROSURGICAL) ×2
ELECTRODE REM PT RTRN 9FT ADLT (ELECTROSURGICAL) ×1 IMPLANT
GLOVE BIO SURGEON STRL SZ 6.5 (GLOVE) ×2 IMPLANT
GLOVE BIOGEL PI IND STRL 6.5 (GLOVE) ×1 IMPLANT
GLOVE BIOGEL PI IND STRL 7.0 (GLOVE) ×3 IMPLANT
GLOVE BIOGEL PI INDICATOR 6.5 (GLOVE) ×1
GLOVE BIOGEL PI INDICATOR 7.0 (GLOVE) ×3
GLOVE ECLIPSE 6.5 STRL STRAW (GLOVE) ×1 IMPLANT
GLOVE SURG SS PI 7.5 STRL IVOR (GLOVE) ×1 IMPLANT
GOWN STRL REUS W/TWL LRG LVL3 (GOWN DISPOSABLE) ×7 IMPLANT
HEMOSTAT SNOW SURGICEL 2X4 (HEMOSTASIS) ×2 IMPLANT
INST SET LAPROSCOPIC AP (KITS) ×2 IMPLANT
KIT TURNOVER KIT A (KITS) ×2 IMPLANT
MANIFOLD NEPTUNE II (INSTRUMENTS) ×2 IMPLANT
NDL INSUFFLATION 14GA 120MM (NEEDLE) ×1 IMPLANT
NEEDLE INSUFFLATION 14GA 120MM (NEEDLE) ×2 IMPLANT
NS IRRIG 1000ML POUR BTL (IV SOLUTION) ×2 IMPLANT
PACK LAP CHOLE LZT030E (CUSTOM PROCEDURE TRAY) ×2 IMPLANT
PAD ARMBOARD 7.5X6 YLW CONV (MISCELLANEOUS) ×2 IMPLANT
SET BASIN LINEN APH (SET/KITS/TRAYS/PACK) ×2 IMPLANT
SET TUBE SMOKE EVAC HIGH FLOW (TUBING) ×2 IMPLANT
SLEEVE ENDOPATH XCEL 5M (ENDOMECHANICALS) ×2 IMPLANT
SUT MNCRL AB 4-0 PS2 18 (SUTURE) ×3 IMPLANT
SUT VICRYL 0 UR6 27IN ABS (SUTURE) ×2 IMPLANT
SYS BAG RETRIEVAL 10MM (BASKET) ×1
SYSTEM BAG RETRIEVAL 10MM (BASKET) ×1 IMPLANT
TROCAR ENDO BLADELESS 11MM (ENDOMECHANICALS) ×2 IMPLANT
TROCAR XCEL NON-BLD 5MMX100MML (ENDOMECHANICALS) ×2 IMPLANT
TROCAR XCEL UNIV SLVE 11M 100M (ENDOMECHANICALS) ×2 IMPLANT
TUBE CONNECTING 12X1/4 (SUCTIONS) ×2 IMPLANT
WARMER LAPAROSCOPE (MISCELLANEOUS) ×2 IMPLANT

## 2019-11-18 NOTE — Anesthesia Postprocedure Evaluation (Signed)
Anesthesia Post Note  Patient: Archivist  Procedure(s) Performed: LAPAROSCOPIC CHOLECYSTECTOMY (N/A Abdomen)  Patient location during evaluation: PACU Anesthesia Type: General Level of consciousness: awake and alert and oriented Pain management: pain level controlled Vital Signs Assessment: post-procedure vital signs reviewed and stable Respiratory status: spontaneous breathing Cardiovascular status: stable Postop Assessment: no apparent nausea or vomiting Anesthetic complications: no     Last Vitals:  Vitals:   11/18/19 0929 11/18/19 0930  BP:  120/82  Pulse: 98 96  Resp: (!) 21 14  Temp:    SpO2: 93% 92%    Last Pain:  Vitals:   11/18/19 0925  TempSrc:   PainSc: Asleep                 Kandace Elrod A

## 2019-11-18 NOTE — Interval H&P Note (Signed)
History and Physical Interval Note:  11/18/2019 7:13 AM  Stacey Compton  has presented today for surgery, with the diagnosis of CHOLELITHIASIS.  The various methods of treatment have been discussed with the patient and family. After consideration of risks, benefits and other options for treatment, the patient has consented to  Procedure(s): LAPAROSCOPIC CHOLECYSTECTOMY (N/A) as a surgical intervention.  The patient's history has been reviewed, patient examined, no change in status, stable for surgery.  I have reviewed the patient's chart and labs.  Questions were answered to the patient's satisfaction.    No changes or questions.   [name]

## 2019-11-18 NOTE — Anesthesia Procedure Notes (Signed)
Procedure Name: Intubation Date/Time: 11/18/2019 7:41 AM Performed by: Andree Elk, Monaca Wadas A, CRNA Pre-anesthesia Checklist: Patient identified, Patient being monitored, Timeout performed, Emergency Drugs available and Suction available Patient Re-evaluated:Patient Re-evaluated prior to induction Oxygen Delivery Method: Circle system utilized Preoxygenation: Pre-oxygenation with 100% oxygen Induction Type: IV induction Ventilation: Mask ventilation without difficulty Laryngoscope Size: Mac and 3 Grade View: Grade II Tube type: Oral Tube size: 7.0 mm Number of attempts: 1 Airway Equipment and Method: Stylet Placement Confirmation: ETT inserted through vocal cords under direct vision,  positive ETCO2 and breath sounds checked- equal and bilateral Secured at: 21 cm Tube secured with: Tape Dental Injury: Teeth and Oropharynx as per pre-operative assessment

## 2019-11-18 NOTE — Discharge Instructions (Signed)
Discharge Laparoscopic Surgery Instructions:  Common Complaints: Right shoulder pain is common after laparoscopic surgery. This is secondary to the gas used in the surgery being trapped under the diaphragm.  Walk to help your body absorb the gas. This will improve in a few days. Pain at the port sites are common, especially the larger port sites. This will improve with time.  Some nausea is common and poor appetite. The main goal is to stay hydrated the first few days after surgery.   Diet/ Activity: Diet as tolerated. You may not have an appetite, but it is important to stay hydrated. Drink 64 ounces of water a day. Your appetite will return with time.  Shower per your regular routine daily.  Do not take hot showers. Take warm showers that are less than 10 minutes. Rest and listen to your body, but do not remain in bed all day.  Walk everyday for at least 15-20 minutes. Deep cough and move around every 1-2 hours in the first few days after surgery.  Do not lift > 10 lbs, perform excessive bending, pushing, pulling, squatting for 1-2 weeks after surgery.  Do not pick at the dermabond glue on your incision sites.  This glue film will remain in place for 1-2 weeks and will start to peel off.  Do not place lotions or balms on your incision unless instructed to specifically by Dr. Constance Haw.   Pain Expectations and Narcotics: -After surgery you will have pain associated with your incisions and this is normal. The pain is muscular and nerve pain, and will get better with time. -You are encouraged and expected to take non narcotic medications like tylenol and ibuprofen (when able) to treat pain as multiple modalities can aid with pain treatment. -Narcotics are only used when pain is severe or there is breakthrough pain. -You are not expected to have a pain score of 0 after surgery, as we cannot prevent pain. A pain score of 3-4 that allows you to be functional, move, walk, and tolerate some activity is  the goal. The pain will continue to improve over the days after surgery and is dependent on your surgery. -Due to [place] law, we are only able to give a certain amount of pain medication to treat post operative pain, and we only give additional narcotics on a patient by patient basis.  -For most laparoscopic surgery, studies have shown that the majority of patients only need 10-15 narcotic pills, and for open surgeries most patients only need 15-20.   -Having appropriate expectations of pain and knowledge of pain management with non narcotics is important as we do not want anyone to become addicted to narcotic pain medication.  -Using ice packs in the first 48 hours and heating pads after 48 hours, wearing an abdominal binder (when recommended), and using over the counter medications are all ways to help with pain management.   -Simple acts like meditation and mindfulness practices after surgery can also help with pain control and research has proven the benefit of these practices.  Medication: Take tylenol and ibuprofen as needed for pain control, alternating every 4-6 hours.  Example:  Tylenol 1000mg  @ 6am, 12noon, 6pm, 72midnight (Do not exceed 4000mg  of tylenol a day). Ibuprofen 800mg  @ 9am, 3pm, 9pm, 3am (Do not exceed 3600mg  of ibuprofen a day).  Take Tramadol for breakthrough pain every 4 hours.  Take Colace for constipation related to narcotic pain medication. If you do not have a bowel movement in 2 days, take Miralax  over the counter.  Drink plenty of water to also prevent constipation.   Contact Information: If you have questions or concerns, please call our office, [phone], Monday- Thursday 8AM-5PM and Friday 8AM-12Noon.  If it is after hours or on the weekend, please call [facility], [phone], and ask to speak to the surgeon on call for Dr. Constance Haw at [facility].    Laparoscopic Cholecystectomy, Care After This sheet gives you information about how to care for yourself  after your procedure. Your health care provider may also give you more specific instructions. If you have problems or questions, contact your health care provider. What can I expect after the procedure? After the procedure, it is common to have:  Pain at your incision sites. You will be given medicines to control this pain.  Mild nausea or vomiting.  Bloating and possible shoulder pain from the air-like gas that was used during the procedure. Follow these instructions at home: Incision care   Follow instructions from your health care provider about how to take care of your incisions. Make sure you: ? Wash your hands with soap and water before you change your bandage (dressing). If soap and water are not available, use hand sanitizer. ? Change your dressing as told by your health care provider. ? Leave stitches (sutures), skin glue, or adhesive strips in place. These skin closures may need to be in place for 2 weeks or longer. If adhesive strip edges start to loosen and curl up, you may trim the loose edges. Do not remove adhesive strips completely unless your health care provider tells you to do that.  Do not take baths, swim, or use a hot tub until your health care provider approves.  You may shower.  Check your incision area every day for signs of infection. Check for: ? More redness, swelling, or pain. ? More fluid or blood. ? Warmth. ? Pus or a bad smell. Activity  Do not drive or use heavy machinery while taking prescription pain medicine.  Do not lift anything that is heavier than 10 lb (4.5 kg) until your health care provider approves.  Do not play contact sports until your health care provider approves.  Do not drive for 24 hours if you were given a medicine to help you relax (sedative).  Rest as needed. Do not return to work or school until your health care provider approves. General instructions  Take over-the-counter and prescription medicines only as told by your  health care provider.  To prevent or treat constipation while you are taking prescription pain medicine, your health care provider may recommend that you: ? Drink enough fluid to keep your urine clear or pale yellow. ? Take over-the-counter or prescription medicines. ? Eat foods that are high in fiber, such as fresh fruits and vegetables, whole grains, and beans. ? Limit foods that are high in fat and processed sugars, such as fried and sweet foods. Contact a health care provider if:  You develop a rash.  You have more redness, swelling, or pain around your incisions.  You have more fluid or blood coming from your incisions.  Your incisions feel warm to the touch.  You have pus or a bad smell coming from your incisions.  You have a fever.  One or more of your incisions breaks open. Get help right away if:  You have trouble breathing.  You have chest pain.  You have increasing pain in your shoulders.  You faint or feel dizzy when you  stand.  You have severe pain in your abdomen.  You have nausea or vomiting that lasts for more than one day.  You have leg pain. This information is not intended to replace advice given to you by your health care provider. Make sure you discuss any questions you have with your health care provider. Document Revised: 10/06/2017 Document Reviewed: 04/11/2016 Elsevier Patient Education  2020 [employer]   PATIENT INSTRUCTIONS POST-ANESTHESIA  IMMEDIATELY FOLLOWING SURGERY:  Do not drive or operate machinery for the first twenty four hours after surgery.  Do not make any important decisions for twenty four hours after surgery or while taking narcotic pain medications or sedatives.  If you develop intractable nausea and vomiting or a severe headache please notify your doctor immediately.  FOLLOW-UP:  Please make an appointment with your surgeon as instructed. You do not need to follow up with anesthesia unless specifically instructed to do  so.  WOUND CARE INSTRUCTIONS (if applicable):  Keep a dry clean dressing on the anesthesia/puncture wound site if there is drainage.  Once the wound has quit draining you may leave it open to air.  Generally you should leave the bandage intact for twenty four hours unless there is drainage.  If the epidural site drains for more than 36-48 hours please call the anesthesia department.  QUESTIONS?:  Please feel free to call your physician or the hospital operator if you have any questions, and they will be happy to assist you.

## 2019-11-18 NOTE — Op Note (Signed)
Operative Note   Preoperative Diagnosis: Symptomatic cholelithiasis   Postoperative Diagnosis: Same   Procedure(s) Performed: Laparoscopic cholecystectomy   Surgeon: Lillia Abed C. Henreitta Leber, MD   Assistants: Franky Macho, MD    Anesthesia: General endotracheal   Anesthesiologist: Dorena Cookey, MD    Specimens: Gallbladder    Estimated Blood Loss: Minimal    Blood Replacement: None    Complications: None    Operative Findings: Distended gallbladder with large mobile stone at neck; common bile duct and right hepatic duct noted and protected    Procedure: The patient was taken to the operating room and placed supine. General endotracheal anesthesia was induced. Intravenous antibiotics were administered per protocol. An orogastric tube positioned to decompress the stomach. The abdomen was prepared and draped in the usual sterile fashion.    A supraumbilical incision was made and a Veress technique was utilized to achieve pneumoperitoneum to 15 mmHg with carbon dioxide. A 11 mm optiview port was placed through the supraumbilical region, and a 10 mm 0-degree operative laparoscope was introduced. The area underlying the trocar and Veress needle were inspected and without evidence of injury.  Remaining trocars were placed under direct vision. Two 5 mm ports were placed in the right abdomen, between the anterior axillary and midclavicular line.  A final 11 mm port was placed through the mid-epigastrium, near the falciform ligament.    The gallbladder fundus was elevated cephalad and the infundibulum was retracted to the patient's right.  Omental attachments were taken down, freeing the gallbladder up to be elevated more.  The gallbladder/cystic duct junction was skeletonized.  It was evident that the common bile duct and what was likely the right hepatic branch was running inferiorly to the gallbladder. To further mobilize the gallbladder and get it on a pedicle away from these structures,  liberal medial and lateral dissection was done as well as dissection in a dome down approach. From here we were able to free the gallbladder from the hepatic bed and retract it to gently sweep away the common and right hepatic branch of the biliary system.  On the medial view, continued dissection revealed the cystic duct and a posterior cystic artery which were up on a pedicle with the right hepatic and common bile duct inferior.  There appeared to be a small branch of the cystic artery running anterior with the cystic duct and this was clipped once to prevent any bleeding from this fatty venous appearing structure (< 75mm in size).     The cystic duct and cystic artery were doubly clipped and divided. The specimen was placed in an Endopouch and was retrieved through the epigastric site.   Final inspection revealed acceptable hemostasis. Surgical Jamelle Haring was placed in the gallbladder bed. Trocars were removed and pneumoperitoneum was released.  0 Vicryl fascial sutures were used to close the epigastric and umbilical port sites. Skin incisions were closed with 4-0 Monocryl subcuticular sutures and Dermabond. The patient was awakened from anesthesia and extubated without complication.    Algis Greenhouse, MD [facility] [address] [place], [place] [address] [phone] (office)

## 2019-11-18 NOTE — Anesthesia Preprocedure Evaluation (Signed)
Anesthesia Evaluation  Patient identified by MRN, date of birth, ID band Patient awake    Reviewed: Allergy & Precautions, NPO status , Patient's Chart, lab work & pertinent test results  History of Anesthesia Complications (+) PONV and history of anesthetic complications  Airway Mallampati: II  TM Distance: >3 FB Neck ROM: Full    Dental no notable dental hx. (+) Teeth Intact   Pulmonary neg pulmonary ROS,    Pulmonary exam normal breath sounds clear to auscultation       Cardiovascular negative cardio ROS Normal cardiovascular examI Rhythm:Regular Rate:Normal     Neuro/Psych  Headaches, Anxiety Depression negative psych ROS   GI/Hepatic Neg liver ROS, GERD  Controlled,Denied GERD when questioned  Denies current Meds for GERD   Endo/Other  negative endocrine ROS  Renal/GU negative Renal ROS  negative genitourinary   Musculoskeletal negative musculoskeletal ROS (+)   Abdominal   Peds negative pediatric ROS (+)  Hematology negative hematology ROS (+) Normal H/H   Anesthesia Other Findings   Reproductive/Obstetrics negative OB ROS                             Anesthesia Physical Anesthesia Plan  ASA: II  Anesthesia Plan: General   Post-op Pain Management:    Induction: Intravenous  PONV Risk Score and Plan: 4 or greater and Midazolam, Treatment may vary due to age or medical condition, Dexamethasone, Ondansetron and Promethazine  Airway Management Planned: Oral ETT  Additional Equipment:   Intra-op Plan:   Post-operative Plan: Extubation in OR  Informed Consent: I have reviewed the patients History and Physical, chart, labs and discussed the procedure including the risks, benefits and alternatives for the proposed anesthesia with the patient or authorized representative who has indicated his/her understanding and acceptance.     Dental advisory given  Plan Discussed  with: CRNA  Anesthesia Plan Comments: (Plan Full PPE use Plan GETA D/W PT -WTP with same after Q&A)        Anesthesia Quick Evaluation

## 2019-11-18 NOTE — Transfer of Care (Signed)
Immediate Anesthesia Transfer of Care Note  Patient: Stacey Compton  Procedure(s) Performed: LAPAROSCOPIC CHOLECYSTECTOMY (N/A Abdomen)  Patient Location: PACU  Anesthesia Type:General  Level of Consciousness: awake, oriented and patient cooperative  Airway & Oxygen Therapy: Patient Spontanous Breathing  Post-op Assessment: Report given to RN and Post -op Vital signs reviewed and stable  Post vital signs: Reviewed and stable  Last Vitals:  Vitals Value Taken Time  BP 122/77 11/18/19 0856  Temp    Pulse 79 11/18/19 0859  Resp 23 11/18/19 0859  SpO2 100 % 11/18/19 0859  Vitals shown include unvalidated device data.  Last Pain:  Vitals:   11/18/19 0654  TempSrc: Oral  PainSc: 0-No pain         Complications: No apparent anesthesia complications

## 2019-11-21 LAB — SURGICAL PATHOLOGY

## 2019-11-29 ENCOUNTER — Telehealth: Payer: Self-pay

## 2019-11-29 NOTE — Telephone Encounter (Signed)
Denyse Amass with covermymeds Called to FU on the PA for the NURTEC. Appeal was filed and called to ensure if provider would like to move forward with the appeal.

## 2019-12-02 NOTE — Telephone Encounter (Signed)
No appeal needed. Went thru  pharmacy ok. I called [facility] and relayed to them.

## 2019-12-03 ENCOUNTER — Other Ambulatory Visit: Payer: Self-pay

## 2019-12-03 ENCOUNTER — Telehealth (INDEPENDENT_AMBULATORY_CARE_PROVIDER_SITE_OTHER): Payer: Self-pay | Admitting: General Surgery

## 2019-12-03 DIAGNOSIS — K8010 Calculus of gallbladder with chronic cholecystitis without obstruction: Secondary | ICD-10-CM

## 2019-12-03 NOTE — Progress Notes (Signed)
[facility]  I am calling the patient for post operative evaluation due to the current COVID 19 pandemic.  The patient had a laparoscopic cholecystectomy on 11/18/2019. The patient reports that they are doing pretty good. The are tolerating a diet, having good pain control, and having regular Bms.  The patient has no concerns. Incisions healing well no redness, some peeling.  Pathology: FINAL MICROSCOPIC DIAGNOSIS:   A. GALLBLADDER, CHOLECYSTECTOMY:  - Chronic cholecystitis with cholelithiasis.   Will see the patient PRN.   Algis Greenhouse, MD [facility] [address] [place], [place] [address] [phone] (office)

## 2019-12-24 ENCOUNTER — Other Ambulatory Visit: Payer: Self-pay | Admitting: *Deleted

## 2019-12-24 ENCOUNTER — Telehealth: Payer: Self-pay | Admitting: Family Medicine

## 2019-12-24 MED ORDER — NORETHINDRONE 0.35 MG PO TABS: 1.0000 | ORAL_TABLET | Freq: Every day | ORAL | 5 refills | Status: DC

## 2019-12-24 NOTE — Telephone Encounter (Signed)
Called and discussed with pt and refills sent to [facility] in [place].

## 2019-12-24 NOTE — Telephone Encounter (Signed)
Patient wanting to know if you would take over filling her birthcontrol because her new insurance will not fill cover it under OB doctor but will cover it under her primary care.Please advise

## 2019-12-24 NOTE — Telephone Encounter (Signed)
For this particular medicine I would be okay with it as long as she does keep up her female health checkups at least with her gynecologist on a regular basis may have 6 months on the prescription would need a follow-up this summer for her other medicines

## 2020-02-12 ENCOUNTER — Encounter: Payer: Self-pay | Admitting: Adult Health

## 2020-02-17 ENCOUNTER — Ambulatory Visit: Payer: BC Managed Care – PPO | Admitting: Adult Health

## 2020-02-18 ENCOUNTER — Other Ambulatory Visit: Payer: Self-pay

## 2020-02-18 ENCOUNTER — Ambulatory Visit (INDEPENDENT_AMBULATORY_CARE_PROVIDER_SITE_OTHER): Payer: 59 | Admitting: Adult Health

## 2020-02-18 VITALS — BP 94/59 | HR 74 | Temp 97.7°F | Ht 63.0 in | Wt 134.8 lb

## 2020-02-18 DIAGNOSIS — G43719 Chronic migraine without aura, intractable, without status migrainosus: Secondary | ICD-10-CM | POA: Diagnosis not present

## 2020-02-18 MED ORDER — SUMATRIPTAN SUCCINATE 50 MG PO TABS: 50.0000 mg | ORAL_TABLET | Freq: Once | ORAL | 12 refills | Status: DC | PRN

## 2020-02-18 NOTE — Progress Notes (Signed)
PATIENT: Stacey Compton DOB: 01/12/1986  REASON FOR VISIT: follow up HISTORY FROM: patient  HISTORY OF PRESENT ILLNESS: Today 02/18/20:  Stacey Compton is a 34 year old female with a history of migraine headaches.  She returns today for follow-up.  She continues on Aimovig and Nurtec.  She reports that she has approximately 2 headaches a month but they may last 2 to 3 days.  She states that Nurtec does give her some benefit but does not resolve the headache.  She reports she has noticed recently that she will get changes in her peripheral vision bilaterally prior to her headache.  She describes it as a bug running across the room.  She returns today for an evaluation  HISTORY 10/17/19:  Stacey Compton is a 34 year old female with a history of migraine headaches.  She returns today for follow-up.  She states that her migraines initially improved with Aimovig however for the last few months she feels like things have gotten worse.  She reports that she has had headache symptoms since November 13.  She reports that her headaches typically start off with an aura that she describes as numbness in the hands and face.  She states afterwards she will actually have pain in the head.  The location of her headache can vary.  She reports that she does have photophobia and phonophobia as well as nausea but no vomiting.  She reports in the past she could take Maxalt as soon as she felt the numbness and the headache would resolve.  She states that she has had new symptoms starting in the last month including extreme fatigue, 3 near syncopal events.  She states that there was several times she felt that she was unable to stand and had to lay down.  She did see her PCP who checked blood work which was relatively unremarkable.  In the past she has tried Topamax, gabapentin, Toradol, Percocet, tizanidine and Flexeril for her headaches.  She returns today for an evaluation.    REVIEW OF SYSTEMS: Out of a complete 14  system review of symptoms, the patient complains only of the following symptoms, and all other reviewed systems are negative.  See HPI  ALLERGIES: Allergies  Allergen Reactions  . Hydrocodone-Homatropine Nausea And Vomiting and Rash    Hycodan   . Percocet [Oxycodone-Acetaminophen] Hives and Rash    HOME MEDICATIONS: Outpatient Medications Prior to Visit  Medication Sig Dispense Refill  . citalopram (CELEXA) 20 MG tablet Take one tablet po every morning (Patient taking differently: Take 20 mg by mouth at bedtime. ) 30 tablet 5  . clonazePAM (KLONOPIN) 0.5 MG tablet Take 1/2-1 tablet po BID prn anxiety (Patient taking differently: Take 0.25-0.5 mg by mouth 2 (two) times daily as needed for anxiety. ) 24 tablet 0  . Erenumab-aooe 140 MG/ML SOAJ Inject 140 mg into the skin every 30 (thirty) days. 3 pen 3  . norethindrone (MICRONOR) 0.35 MG tablet Take 1 tablet (0.35 mg total) by mouth at bedtime. 1 Package 5  . ondansetron (ZOFRAN-ODT) 8 MG disintegrating tablet Take 1 tablet (8 mg total) by mouth every 8 (eight) hours as needed for nausea or vomiting. 30 tablet 0  . Rimegepant Sulfate (NURTEC) 75 MG TBDP Take 75 mg by mouth daily as needed. Take at the onset of migraine.1 tablet/24 hours 10 tablet 5  . docusate sodium (COLACE) 100 MG capsule Take 1 capsule (100 mg total) by mouth 2 (two) times daily as needed for mild constipation. 60 capsule  2  . ketorolac (TORADOL) 10 MG tablet Take 1 tablet (10 mg total) by mouth every 6 (six) hours as needed. (Patient not taking: Reported on 11/04/2019) 20 tablet 0  . traMADol (ULTRAM) 50 MG tablet Take 1 tablet (50 mg total) by mouth every 6 (six) hours as needed for severe pain. 20 tablet 0   Facility-Administered Medications Prior to Visit  Medication Dose Route Frequency Provider Last Rate Last Admin  . methylPREDNISolone acetate (DEPO-MEDROL) injection 40 mg  40 mg Intra-Lesional Once Merlyn Albert, MD        PAST MEDICAL HISTORY: Past  Medical History:  Diagnosis Date  . Anemia   . Anxiety   . Depression    denies  . Endometriosis   . Hx of varicella   . IBS (irritable bowel syndrome)   . Infection    UTI  . Migraines    with aura  . PONV (postoperative nausea and vomiting)     PAST SURGICAL HISTORY: Past Surgical History:  Procedure Laterality Date  . CHOLECYSTECTOMY N/A 11/18/2019   Procedure: LAPAROSCOPIC CHOLECYSTECTOMY;  Surgeon: [name], MD;  Location: [facility];  Service: General;  Laterality: N/A;  . LAPAROSCOPY     with chromopertubation  . OTHER SURGICAL HISTORY     dx lap for endometriosis    FAMILY HISTORY: Family History  Problem Relation Age of Onset  . Fibromyalgia Mother   . Diabetes Mother        borderline  . Depression Father   . Diabetes Father        type 2  . Anxiety disorder Sister   . Bipolar disorder Maternal Aunt   . Cancer Maternal Grandmother        mouth and lung  . Bipolar disorder Maternal Grandfather   . Stroke Maternal Grandfather   . Heart disease Paternal Grandfather     SOCIAL HISTORY: Social History   Socioeconomic History  . Marital status: Married    Spouse name: Not on file  . Number of children: Not on file  . Years of education: Not on file  . Highest education level: Not on file  Occupational History  . Not on file  Tobacco Use  . Smoking status: Never Smoker  . Smokeless tobacco: Never Used  Substance and Sexual Activity  . Alcohol use: No  . Drug use: No  . Sexual activity: Yes    Birth control/protection: None  Other Topics Concern  . Not on file  Social History Narrative  . Not on file   Social Determinants of Health   Financial Resource Strain:   . Difficulty of Paying Living Expenses:   Food Insecurity:   . Worried About Programme researcher, broadcasting/film/video in the Last Year:   . Barista in the Last Year:   Transportation Needs:   . Freight forwarder (Medical):   Marland Kitchen Lack of Transportation (Non-Medical):   Physical  Activity:   . Days of Exercise per Week:   . Minutes of Exercise per Session:   Stress:   . Feeling of Stress :   Social Connections:   . Frequency of Communication with Friends and Family:   . Frequency of Social Gatherings with Friends and Family:   . Attends Religious Services:   . Active Member of Clubs or Organizations:   . Attends Banker Meetings:   Marland Kitchen Marital Status:   Intimate Partner Violence:   . Fear of Current or Ex-Partner:   .  Emotionally Abused:   Marland Kitchen Physically Abused:   . Sexually Abused:       PHYSICAL EXAM  Vitals:   02/18/20 1414  BP: (!) 94/59  Pulse: 74  Temp: 97.7 F (36.5 C)  Weight: 134 lb 12.8 oz (61.1 kg)  Height: 5\' 3"  (1.6 m)   Body mass index is 23.88 kg/m.  Generalized: Well developed, in no acute distress   Neurological examination  Mentation: Alert oriented to time, place, history taking. Follows all commands speech and language fluent Cranial nerve II-XII: Pupils were equal round reactive to light. Extraocular movements were full, visual field were full on confrontational test.  Head turning and shoulder shrug  were normal and symmetric. Motor: The motor testing reveals 5 over 5 strength of all 4 extremities. Good symmetric motor tone is noted throughout.  Sensory: Sensory testing is intact to soft touch on all 4 extremities. No evidence of extinction is noted.  Coordination: Cerebellar testing reveals good finger-nose-finger and heel-to-shin bilaterally.  Gait and station: Gait is normal. . No drift is seen.  Reflexes: Deep tendon reflexes are symmetric and normal bilaterally.   DIAGNOSTIC DATA (LABS, IMAGING, TESTING) - I reviewed patient records, labs, notes, testing and imaging myself where available.  Lab Results  Component Value Date   WBC 10.2 10/23/2019   HGB 16.6 (H) 10/23/2019   HCT 50.0 (H) 10/23/2019   MCV 96.3 10/23/2019   PLT 357 10/23/2019      Component Value Date/Time   NA 139 10/23/2019 1730    NA 140 10/11/2019 0958   K 3.7 10/23/2019 1730   CL 103 10/23/2019 1730   CO2 27 10/23/2019 1730   GLUCOSE 105 (H) 10/23/2019 1730   BUN 9 10/23/2019 1730   BUN 9 10/11/2019 0958   CREATININE 0.57 10/23/2019 1730   CALCIUM 9.4 10/23/2019 1730   PROT 8.7 (H) 10/23/2019 1730   PROT 7.7 10/11/2019 0958   ALBUMIN 4.9 10/23/2019 1730   ALBUMIN 4.9 (H) 10/11/2019 0958   AST 18 10/23/2019 1730   ALT 17 10/23/2019 1730   ALKPHOS 55 10/23/2019 1730   BILITOT 0.4 10/23/2019 1730   BILITOT 0.4 10/11/2019 0958   GFRNONAA >60 10/23/2019 1730   GFRAA >60 10/23/2019 1730    Lab Results  Component Value Date   TSH 1.300 10/11/2019      ASSESSMENT AND PLAN 34 y.o. year old female  has a past medical history of Anemia, Anxiety, Depression, Endometriosis, varicella, IBS (irritable bowel syndrome), Infection, Migraines, and PONV (postoperative nausea and vomiting). here with:  1.  Migraine headaches  -Continue Aimovig -Continue Nurtec -Start Imitrex 50 mg take 1 tablet at the onset of a migraine and repeat in 2 hours if needed. -Advised if headache frequency or severity worsen she should let us know.  Follow-up in 6 months or sooner if needed   I spent 25 minutes of face-to-face and non-face-to-face time with patient.  This included previsit chart review, lab review, study review, order entry, electronic health record documentation, patient education.  Ward Givens, MSN, NP-C 02/18/2020, 2:40 PM [name] [address], [place], [place] [address] [phone]

## 2020-02-18 NOTE — Patient Instructions (Addendum)
Your Plan:  Continue Aimovig  Continue Nurtec as an abortive therapy Imitrex ordered that you can also use as an abortive therapy. Take 1 tablet at the onset of migraine and repeat in 2 hours if needed.  If your symptoms worsen or you develop new symptoms please let us know.    Thank you for coming to see [place] at [facility]. I hope we have been able to provide you high quality care today.  You may receive a patient satisfaction survey over the next few weeks. We would appreciate your feedback and comments so that we may continue to improve ourselves and the health of our patients.  Sumatriptan tablets What is this medicine? SUMATRIPTAN (soo ma TRIP tan) is used to treat migraines with or without aura. An aura is a strange feeling or visual disturbance that warns you of an attack. It is not used to prevent migraines. This medicine may be used for other purposes; ask your health care provider or pharmacist if you have questions. COMMON BRAND NAME(S): Imitrex, Migraine Pack What should I tell my health care provider before I take this medicine? They need to know if you have any of these conditions:  cigarette smoker  circulation problems in fingers and toes  diabetes  heart disease  high blood pressure  high cholesterol  history of irregular heartbeat  history of stroke  kidney disease  liver disease  stomach or intestine problems  an unusual or allergic reaction to sumatriptan, other medicines, foods, dyes, or preservatives  pregnant or trying to get pregnant  breast-feeding How should I use this medicine? Take this medicine by mouth with a glass of water. Follow the directions on the prescription label. Do not take it more often than directed. Talk to your pediatrician regarding the use of this medicine in children. Special care may be needed. Overdosage: If you think you have taken too much of this medicine contact a poison control center or  emergency room at once. NOTE: This medicine is only for you. Do not share this medicine with others. What if I miss a dose? This does not apply. This medicine is not for regular use. What may interact with this medicine? Do not take this medicine with any of the following medicines:  certain medicines for migraine headache like almotriptan, eletriptan, frovatriptan, naratriptan, rizatriptan, sumatriptan, zolmitriptan  ergot alkaloids like dihydroergotamine, ergonovine, ergotamine, methylergonovine  MAOIs like Carbex, Eldepryl, Marplan, Nardil, and Parnate This medicine may also interact with the following medications:  certain medicines for depression, anxiety, or psychotic disorders This list may not describe all possible interactions. Give your health care provider a list of all the medicines, herbs, non-prescription drugs, or dietary supplements you use. Also tell them if you smoke, drink alcohol, or use illegal drugs. Some items may interact with your medicine. What should I watch for while using this medicine? Visit your healthcare professional for regular checks on your progress. Tell your healthcare professional if your symptoms do not start to get better or if they get worse. You may get drowsy or dizzy. Do not drive, use machinery, or do anything that needs mental alertness until you know how this medicine affects you. Do not stand up or sit up quickly, especially if you are an older patient. This reduces the risk of dizzy or fainting spells. Alcohol may interfere with the effect of this medicine. Tell your healthcare professional right away if you have any change in your eyesight. If you take migraine medicines for  10 or more days a month, your migraines may get worse. Keep a diary of headache days and medicine use. Contact your healthcare professional if your migraine attacks occur more frequently. What side effects may I notice from receiving this medicine? Side effects that you  should report to your doctor or health care professional as soon as possible:  allergic reactions like skin rash, itching or hives, swelling of the face, lips, or tongue  changes in vision  chest pain or chest tightness  signs and symptoms of a dangerous change in heartbeat or heart rhythm like chest pain; dizziness; fast, irregular heartbeat; palpitations; feeling faint or lightheaded; falls; breathing problems  signs and symptoms of a stroke like changes in vision; confusion; trouble speaking or understanding; severe headaches; sudden numbness or weakness of the face, arm or leg; trouble walking; dizziness; loss of balance or coordination  signs and symptoms of serotonin syndrome like irritable; confusion; diarrhea; fast or irregular heartbeat; muscle twitching; stiff muscles; trouble walking; sweating; high fever; seizures; chills; vomiting Side effects that usually do not require medical attention (report to your doctor or health care professional if they continue or are bothersome):  diarrhea  dizziness  drowsiness  dry mouth  headache  nausea, vomiting  pain, tingling, numbness in the hands or feet  stomach pain This list may not describe all possible side effects. Call your doctor for medical advice about side effects. You may report side effects to FDA at 1-800-FDA-1088. Where should I keep my medicine? Keep out of the reach of children. Store at room temperature between 2 and 30 degrees C (36 and 86 degrees F). Throw away any unused medicine after the expiration date. NOTE: This sheet is a summary. It may not cover all possible information. If you have questions about this medicine, talk to your doctor, pharmacist, or health care provider.  2020 Elsevier/Gold Standard (2018-05-08 15:05:37)

## 2020-02-18 NOTE — Progress Notes (Signed)
I have read the note, and I agree with the clinical assessment and plan.  Nikitha Mode A. Bellany Elbaum, MD, PhD, FAAN Certified in Neurology, Clinical Neurophysiology, Sleep Medicine, Pain Medicine and Neuroimaging  [name] [address], [address] [place], [place] [address] [phone]  

## 2020-03-17 ENCOUNTER — Other Ambulatory Visit: Payer: Self-pay | Admitting: Family Medicine

## 2020-03-18 ENCOUNTER — Telehealth: Payer: Self-pay | Admitting: *Deleted

## 2020-03-18 NOTE — Telephone Encounter (Signed)
CMM Key [id number] Nurtec 75mg  initiated. Determination pending. [facility] [place].

## 2020-03-18 NOTE — Telephone Encounter (Signed)
09/23/19 was the last visit discussing this med

## 2020-03-19 ENCOUNTER — Telehealth (INDEPENDENT_AMBULATORY_CARE_PROVIDER_SITE_OTHER): Payer: 59 | Admitting: Family Medicine

## 2020-03-19 ENCOUNTER — Telehealth: Payer: Self-pay | Admitting: Family Medicine

## 2020-03-19 ENCOUNTER — Other Ambulatory Visit: Payer: Self-pay

## 2020-03-19 DIAGNOSIS — Z9049 Acquired absence of other specified parts of digestive tract: Secondary | ICD-10-CM | POA: Diagnosis not present

## 2020-03-19 DIAGNOSIS — K529 Noninfective gastroenteritis and colitis, unspecified: Secondary | ICD-10-CM | POA: Diagnosis not present

## 2020-03-19 DIAGNOSIS — R110 Nausea: Secondary | ICD-10-CM

## 2020-03-19 MED ORDER — ONDANSETRON 8 MG PO TBDP: ORAL_TABLET | ORAL | 5 refills | Status: DC

## 2020-03-19 MED ORDER — CITALOPRAM HYDROBROMIDE 20 MG PO TABS: 20.0000 mg | ORAL_TABLET | Freq: Every day | ORAL | 5 refills | Status: DC

## 2020-03-19 MED ORDER — FAMOTIDINE 40 MG PO TABS: ORAL_TABLET | ORAL | 5 refills | Status: DC

## 2020-03-19 MED ORDER — CHOLESTYRAMINE 4 GM/DOSE PO POWD: ORAL | 5 refills | Status: DC

## 2020-03-19 NOTE — Progress Notes (Signed)
   Subjective:  Audio video  Patient ID: Stacey Compton, female    DOB: 01/05/1986, 34 y.o.   MRN: [id number]  Diarrhea  This is a recurrent problem. Episode onset: 2 weeks ago. Associated symptoms comments: Stomach pain, vomiting, unable to keep food/liquids down.  Pt had gallbladder taken out in January. . Treatments tried: Zofran, Immodium.   Virtual Visit via Video Note  I connected with Zianne R Gangemi on 03/19/20 at  8:20 AM EDT by a video enabled telemedicine application and verified that I am speaking with the correct person using two identifiers.  Location: Patient: home  Provider: office   I discussed the limitations of evaluation and management by telemedicine and the availability of in person appointments. The patient expressed understanding and agreed to proceed.  History of Present Illness:    Observations/Objective:   Assessment and Plan:   Follow Up Instructions:    I discussed the assessment and treatment plan with the patient. The patient was provided an opportunity to ask questions and all were answered. The patient agreed with the plan and demonstrated an understanding of the instructions.   The patient was advised to call back or seek an in-person evaluation if the symptoms worsen or if the condition fails to improve as anticipated.  I provided 23 minutes of non-face-to-face time during this encounter.   Patient is 6 months out from cholecystectomy.  Has had diarrhea at times but definitely worse in recent weeks.  Accompanied by substantial cramping.  Over-the-counter Imodium does minimal  Also has developed nausea with periods of frank vomiting.  Fairly substantial at times.  Does get headaches but feels they are a result of her GI difficulties and not part of her GI challenges    Review of Systems  Gastrointestinal: Positive for diarrhea.       Objective:   Physical Exam   Virtual    Assessment & Plan:  Impression 1 postcholecystectomy  diarrhea.  Initiate cholestyramine.  Once daily, and then moving to 1 scoop twice daily.  Rationale discussed.  2.  Vomiting./Nausea.  Will initiate Pepcid for elements of reflux.  Zofran as needed.  Hopefully is #1 calms down #2 alcohol: Down.  May need GI referral if things persist

## 2020-03-19 NOTE — Telephone Encounter (Signed)
Received approval 03-18-20 thru 03-18-21 [employer]. [id number].  Faxed approval to [employer].

## 2020-03-19 NOTE — Telephone Encounter (Signed)
[name], bigos are scheduled for a virtual visit with your provider today.    Just as we do with appointments in the office, we must obtain your consent to participate.  Your consent will be active for this visit and any virtual visit you may have with one of our providers in the next 365 days.    If you have a MyChart account, I can also send a copy of this consent to you electronically.  All virtual visits are billed to your insurance company just like a traditional visit in the office.  As this is a virtual visit, video technology does not allow for your provider to perform a traditional examination.  This may limit your provider's ability to fully assess your condition.  If your provider identifies any concerns that need to be evaluated in person or the need to arrange testing such as labs, EKG, etc, we will make arrangements to do so.    Although advances in technology are sophisticated, we cannot ensure that it will always work on either your end or our end.  If the connection with a video visit is poor, we may have to switch to a telephone visit.  With either a video or telephone visit, we are not always able to ensure that we have a secure connection.   I need to obtain your verbal consent now.   Are you willing to proceed with your visit today?   Stacey Compton has provided verbal consent on 03/19/2020 for a virtual visit (video or telephone).   Marlowe Shores, LPN [id number]  8:56 AM

## 2020-05-04 ENCOUNTER — Encounter: Payer: Self-pay | Admitting: Family Medicine

## 2020-05-04 ENCOUNTER — Telehealth: Payer: Self-pay | Admitting: Family Medicine

## 2020-05-04 ENCOUNTER — Other Ambulatory Visit: Payer: Self-pay | Admitting: Family Medicine

## 2020-05-04 NOTE — Telephone Encounter (Signed)
Patient scheduled appointment for 05/27/20 for medication follow up that was your first available apointment. She is requesting refill on Klonopin 0.5 mg to be called into [facility] [place]

## 2020-05-04 NOTE — Telephone Encounter (Signed)
LVM to make appt

## 2020-05-04 NOTE — Telephone Encounter (Signed)
Phone note sent to Dr. Lorin Picket waiting on response.

## 2020-05-04 NOTE — Telephone Encounter (Signed)
Patient scheduled appointment for 05/27/20 medication follow up

## 2020-05-05 ENCOUNTER — Encounter: Payer: Self-pay | Admitting: *Deleted

## 2020-05-05 MED ORDER — CLONAZEPAM 0.5 MG PO TABS: ORAL_TABLET | ORAL | 1 refills | Status: DC

## 2020-05-05 NOTE — Telephone Encounter (Signed)
Medication was signed

## 2020-05-05 NOTE — Telephone Encounter (Signed)
May have 2 refills please pend

## 2020-05-12 ENCOUNTER — Other Ambulatory Visit: Payer: Self-pay

## 2020-05-12 ENCOUNTER — Telehealth (INDEPENDENT_AMBULATORY_CARE_PROVIDER_SITE_OTHER): Payer: 59 | Admitting: Family Medicine

## 2020-05-12 DIAGNOSIS — R519 Headache, unspecified: Secondary | ICD-10-CM

## 2020-05-12 DIAGNOSIS — K529 Noninfective gastroenteritis and colitis, unspecified: Secondary | ICD-10-CM

## 2020-05-12 MED ORDER — DIPHENOXYLATE-ATROPINE 2.5-0.025 MG PO TABS: 1.0000 | ORAL_TABLET | Freq: Four times a day (QID) | ORAL | 0 refills | Status: DC | PRN

## 2020-05-12 MED ORDER — PREDNISONE 20 MG PO TABS: ORAL_TABLET | ORAL | 0 refills | Status: DC

## 2020-05-12 MED ORDER — INDOMETHACIN 25 MG PO CAPS: ORAL_CAPSULE | ORAL | 1 refills | Status: DC

## 2020-05-12 NOTE — Progress Notes (Signed)
   Subjective:    Patient ID: Stacey Compton, female    DOB: Jul 18, 1986, 34 y.o.   MRN: [id number]  Headache  This is a new problem. Episode onset: Started saturday. Associated symptoms comments: Diarrhea, lower middle abdominal pain. Reports keeping down fluids but not able to eat much of anything. Slight nausea, no vomiting. . Treatments tried: Ibuprofen, Ketorolac  The treatment provided mild relief.   This patient has several bouts per year of severe headaches with nausea throbbing not feeling good with this particular one she has tried her usual medicine including sumatriptan hand but still having trouble She also has having profuse watery stools over the past couple days as well she has had some intermittent bouts of this. Patient states she has not had to take much in the way of Imitrex.  It is unlikely that the Imitrex is causing her to have serotonin syndrome.  Review of Systems  Neurological: Positive for headaches.  Diarrhea headache throbbing nausea no fevers     Objective:   Physical Exam  Patient had virtual visit Appears to be in no distress Atraumatic Neuro able to relate and oriented No apparent resp distress Color normal       Assessment & Plan:  Migraine Short course prednisone 2 tablets daily for the next 5 days Indomethacin 3 times daily as needed migraine headache May continue her other medicines Patient was told if she has to start use of Imitrex on a regular basis she may need to come off of Celexa or go to a separate medicine She will try the Lomotil for the severe diarrhea.  If that worsens any of her issues she will stop the Lomotil The patient will give [place] updates in 48 hours if not improving She will follow up with neurology in the fall If she has ongoing diarrhea issues strong consideration for referral to GI as well as lab work

## 2020-05-18 ENCOUNTER — Telehealth: Payer: Self-pay | Admitting: Family Medicine

## 2020-05-18 DIAGNOSIS — R109 Unspecified abdominal pain: Secondary | ICD-10-CM

## 2020-05-18 DIAGNOSIS — R112 Nausea with vomiting, unspecified: Secondary | ICD-10-CM

## 2020-05-18 NOTE — Telephone Encounter (Signed)
Nurses I did speak with Stacey Compton Patient having significant nausea and not feeling good some headache she will be talking with neurology Because of her persistent nausea she does need to do some lab work  I recommend CBC, CMP, lipase, amylase Reason abdominal discomfort nausea and vomiting  She will do this through [employer]

## 2020-05-18 NOTE — Telephone Encounter (Signed)
error 

## 2020-05-19 ENCOUNTER — Ambulatory Visit (INDEPENDENT_AMBULATORY_CARE_PROVIDER_SITE_OTHER): Payer: 59 | Admitting: *Deleted

## 2020-05-19 ENCOUNTER — Encounter: Payer: Self-pay | Admitting: Adult Health

## 2020-05-19 ENCOUNTER — Other Ambulatory Visit: Payer: Self-pay

## 2020-05-19 DIAGNOSIS — G43719 Chronic migraine without aura, intractable, without status migrainosus: Secondary | ICD-10-CM

## 2020-05-19 MED ORDER — KETOROLAC TROMETHAMINE 60 MG/2ML IM SOLN
30.0000 mg | Freq: Once | INTRAMUSCULAR | Status: AC
  Administered 2020-05-19: 30 mg via INTRAMUSCULAR

## 2020-05-19 NOTE — Telephone Encounter (Signed)
Ok to do Depacon 1000 mg and Toradol 30 mg if [facility] has Depacon

## 2020-05-19 NOTE — Telephone Encounter (Signed)
Spoke to MM/NP since no depacon ok to give toradol IM 30mg  if pt wants.  I relayed tp pt and she will come in today prior to 1600 and get this and bring driver.

## 2020-05-19 NOTE — Addendum Note (Signed)
Addended by: Marlowe Shores on: 05/19/2020 08:17 AM   Modules accepted: Orders

## 2020-05-19 NOTE — Telephone Encounter (Signed)
Lab orders placed and pt is aware 

## 2020-05-19 NOTE — Telephone Encounter (Signed)
I called pt and she has had migraine more on then off since last week.  Given prednisone for her migraine per pcp, had 2 days were ok, then since last Thursday till today having level 7-8 dizziness, numbness/tingling face (sx when she has migraines). Taken imitrex not helped.  She is not pregnant, has driver.  Has never had depacon, but has been mentioned previously.  Please advise.

## 2020-05-19 NOTE — Progress Notes (Signed)
Pt here Toradol 30mg  IM injection for her migraine level 7-8.  Under aseptic technique 30mg  IM given L deltoid.  Tolerated well.  Bandaid applied. She has driver (her father).  Wasted 30mg  /63ml.  She will let know how she does.

## 2020-05-20 ENCOUNTER — Telehealth: Payer: Self-pay

## 2020-05-20 ENCOUNTER — Telehealth: Payer: Self-pay | Admitting: Family Medicine

## 2020-05-20 NOTE — Telephone Encounter (Signed)
So I am very sympathetic to what is going on but I truly believe that neurology is her best bet to help her with her headache. The purpose of the lab work is to make sure there is not a metabolic issue contributing. I would encourage the patient to reach back out to neurology to see what they advise as the next step thank you

## 2020-05-20 NOTE — Telephone Encounter (Signed)
Pt called Sandy,RN back.  Please call

## 2020-05-20 NOTE — Telephone Encounter (Signed)
Patient states that she was able to get some sleep last night after receiving the Toradol yesterday but when she woke up today and her migraine feels worse, she has blurred vision, and feels weak and fatigued. She is wondering what else she can do?

## 2020-05-20 NOTE — Telephone Encounter (Signed)
Agree with ED or Urgent care since we don't have have Depacon and she has already tried prednisone and toradol

## 2020-05-20 NOTE — Telephone Encounter (Signed)
Please advise. Thank you

## 2020-05-20 NOTE — Telephone Encounter (Signed)
Pt contacted and verbalized understanding. Pt has called [facility] and left them a message and is waiting on a call back from them. Pt is going to try to go to lab this evening to have labs done.

## 2020-05-20 NOTE — Telephone Encounter (Signed)
Patient states migraine is worst today but seen specialist yesterday and got shot but not helping feeling dizzy and light -headed cant even get labs done. She is wanting to know her next step. Please advise

## 2020-05-20 NOTE — Telephone Encounter (Signed)
Called pt and LMVM for her that she may need to go to ED or urgent care for cocktail / IVF , she may call me back.

## 2020-05-21 ENCOUNTER — Encounter: Payer: Self-pay | Admitting: Family Medicine

## 2020-05-21 ENCOUNTER — Other Ambulatory Visit: Payer: Self-pay | Admitting: Family Medicine

## 2020-05-21 ENCOUNTER — Telehealth: Payer: Self-pay | Admitting: Family Medicine

## 2020-05-21 LAB — COMPREHENSIVE METABOLIC PANEL
ALT: 16 IU/L (ref 0–32)
AST: 19 IU/L (ref 0–40)
Albumin/Globulin Ratio: 1.6 (ref 1.2–2.2)
Albumin: 4.6 g/dL (ref 3.8–4.8)
Alkaline Phosphatase: 56 IU/L (ref 48–121)
BUN/Creatinine Ratio: 10 (ref 9–23)
BUN: 6 mg/dL (ref 6–20)
Bilirubin Total: 0.5 mg/dL (ref 0.0–1.2)
CO2: 27 mmol/L (ref 20–29)
Calcium: 9.4 mg/dL (ref 8.7–10.2)
Chloride: 98 mmol/L (ref 96–106)
Creatinine, Ser: 0.63 mg/dL (ref 0.57–1.00)
GFR calc Af Amer: 134 mL/min/{1.73_m2} (ref >59)
GFR calc non Af Amer: 117 mL/min/{1.73_m2} (ref >59)
Globulin, Total: 2.8 g/dL (ref 1.5–4.5)
Glucose: 111 mg/dL — ABNORMAL HIGH (ref 65–99)
Potassium: 4.1 mmol/L (ref 3.5–5.2)
Sodium: 136 mmol/L (ref 134–144)
Total Protein: 7.4 g/dL (ref 6.0–8.5)

## 2020-05-21 LAB — CBC WITH DIFFERENTIAL/PLATELET
Basophils Absolute: 0 10*3/uL (ref 0.0–0.2)
Basos: 0 %
EOS (ABSOLUTE): 0.2 10*3/uL (ref 0.0–0.4)
Eos: 2 %
Hematocrit: 43.4 % (ref 34.0–46.6)
Hemoglobin: 14.5 g/dL (ref 11.1–15.9)
Immature Grans (Abs): 0 10*3/uL (ref 0.0–0.1)
Immature Granulocytes: 0 %
Lymphocytes Absolute: 2.6 10*3/uL (ref 0.7–3.1)
Lymphs: 23 %
MCH: 31.5 pg (ref 26.6–33.0)
MCHC: 33.4 g/dL (ref 31.5–35.7)
MCV: 94 fL (ref 79–97)
Monocytes Absolute: 0.4 10*3/uL (ref 0.1–0.9)
Monocytes: 4 %
Neutrophils Absolute: 7.7 10*3/uL — ABNORMAL HIGH (ref 1.4–7.0)
Neutrophils: 71 %
Platelets: 304 10*3/uL (ref 150–450)
RBC: 4.6 x10E6/uL (ref 3.77–5.28)
RDW: 12 % (ref 11.7–15.4)
WBC: 10.9 10*3/uL — ABNORMAL HIGH (ref 3.4–10.8)

## 2020-05-21 LAB — AMYLASE: Amylase: 56 U/L (ref 31–110)

## 2020-05-21 LAB — LIPASE: Lipase: 27 U/L (ref 14–72)

## 2020-05-21 MED ORDER — ONDANSETRON HCL 8 MG PO TABS: 8.0000 mg | ORAL_TABLET | Freq: Three times a day (TID) | ORAL | 1 refills | Status: DC | PRN

## 2020-05-21 NOTE — Telephone Encounter (Signed)
Patient is coming to be worked in with me 9:30 AM Friday morning thank you

## 2020-05-21 NOTE — Telephone Encounter (Signed)
I called pt and LMVM for her that per MM/NP that she should go to ED/urgent care if needs infusion has depacon is backordered and she has tried prednisone and toradol and still having issues.

## 2020-05-22 ENCOUNTER — Other Ambulatory Visit: Payer: Self-pay

## 2020-05-22 ENCOUNTER — Ambulatory Visit: Payer: 59 | Admitting: Family Medicine

## 2020-05-22 VITALS — BP 112/72 | Temp 97.6°F | Wt 137.4 lb

## 2020-05-22 DIAGNOSIS — R519 Headache, unspecified: Secondary | ICD-10-CM

## 2020-05-22 DIAGNOSIS — K580 Irritable bowel syndrome with diarrhea: Secondary | ICD-10-CM | POA: Diagnosis not present

## 2020-05-22 MED ORDER — TIZANIDINE HCL 4 MG PO TABS: ORAL_TABLET | ORAL | 1 refills | Status: DC

## 2020-05-22 MED ORDER — DICYCLOMINE HCL 10 MG PO CAPS: ORAL_CAPSULE | ORAL | 1 refills | Status: DC

## 2020-05-22 NOTE — Progress Notes (Signed)
   Subjective:    Patient ID: Stacey Compton, female    DOB: [birth year], 34 y.o.   MRN: [id number]  HPI  Patient arrives for follow up on migraine for 5 days. Patient states she got a shot of Tordol by neurology and they advised she call around to see if any ERs have migraine cocktail depacon-it is in short supply. Patient also having bad nausea and diarrhea and no appetite. Patient with complex history of severe headaches under the care of neurology She also tends to have a lot of GI symptoms when she is having migraines Much of this is related to irritable bowel  Review of Systems Please see above    Objective:   Physical Exam  Lungs are clear respiratory rate normal heart regular no murmurs extremities no edema      Assessment & Plan:  Severe migraine is taken several days to get better will gradually get better I would not recommend additional medicines other than what neurology recommends  She can try Zanaflex at nighttime to see if it might help with some of the muscle tension and her scalp to lessen her headache caution drowsiness  Dicyclomine 3 times daily as needed IBS with diarrhea issues if ongoing troubles notify us we will help set up with gastroenterology

## 2020-05-24 ENCOUNTER — Other Ambulatory Visit: Payer: Self-pay

## 2020-05-24 ENCOUNTER — Ambulatory Visit
Admission: EM | Admit: 2020-05-24 | Discharge: 2020-05-24 | Disposition: A | Discharge status: Home / Self Care | Payer: 59

## 2020-05-24 NOTE — ED Triage Notes (Signed)
Pt having n/v/d and loss of appetite for weeks now also reports migraines that will not go away.  Seen by pcp for same problem.  Pt unable to make appt with neurology at this time.

## 2020-05-24 NOTE — ED Notes (Signed)
Patient is being discharged from the Urgent Care and sent to the Emergency Department via pov. Per K. Avegno, patient is in need of higher level of care due to abd pain and migraines . Patient is aware and verbalizes understanding of plan of care. There were no vitals filed for this visit.

## 2020-05-25 ENCOUNTER — Telehealth: Payer: Self-pay | Admitting: Family Medicine

## 2020-05-25 ENCOUNTER — Other Ambulatory Visit: Payer: Self-pay | Admitting: *Deleted

## 2020-05-25 DIAGNOSIS — R1031 Right lower quadrant pain: Secondary | ICD-10-CM

## 2020-05-25 DIAGNOSIS — R109 Unspecified abdominal pain: Secondary | ICD-10-CM

## 2020-05-25 NOTE — Telephone Encounter (Signed)
So it is possible patient may be developing colitis. Additional testing is reasonable Also referral to gastroenterology reasonable although gastroenterology may not be able to work her in for several weeks If patient is willing I recommend the following Given her symptoms I would recommend the following -Stool culture, stool C. difficile PCR reflex -CT scan abdomen pelvis with contrast due to severe lower abdominal pain Ideally it would be best for the CT scan to be done within the next 2 days If patient gets acutely worse emergency department

## 2020-05-25 NOTE — Telephone Encounter (Signed)
Nurses The my chart application would not allow me to put an additional response  Please talk with the patient Let her know we are willing to help her but need to know some additional information in order to help guide further testing and referrals  Her abdominal discomfort currently-we are in her abdomen is she hurting currently?  Lower abdominal area?  Is she hurting any in the right upper quadrant region?  Is the diarrhea watery?  Mucus or blood?  Fevers?  What is she taking currently and is any medication helping?  May need to have further testing or referral

## 2020-05-25 NOTE — Telephone Encounter (Signed)
Order for ct scan put in stat so we can get scheduled within the next 2 days, order for stool studies put in and referral to gi put in. Pt verbalized understanding of all.

## 2020-05-25 NOTE — Telephone Encounter (Signed)
Pt states pain in lower abdomen in the front and pain on mid to lower right side. Pain is sharp at times. Worse when lying down at night. Trouble sleeping over the weekend. Muscle relaxer has helped some with being able to rest at night. Diarrhea is water and has mucus. No blood. No fever. Has not ate since Friday. No appetite. Drinking fluids. Taking bentyl and it does take the edge off.

## 2020-05-26 ENCOUNTER — Other Ambulatory Visit: Payer: Self-pay

## 2020-05-26 ENCOUNTER — Other Ambulatory Visit: Payer: Self-pay | Admitting: *Deleted

## 2020-05-26 ENCOUNTER — Telehealth: Payer: Self-pay | Admitting: Family Medicine

## 2020-05-26 ENCOUNTER — Encounter: Payer: Self-pay | Admitting: Family Medicine

## 2020-05-26 ENCOUNTER — Ambulatory Visit (HOSPITAL_COMMUNITY)
Admission: RE | Admit: 2020-05-26 | Discharge: 2020-05-26 | Disposition: A | Discharge status: Home / Self Care | Payer: 59 | Source: Ambulatory Visit | Attending: Family Medicine | Admitting: Family Medicine

## 2020-05-26 DIAGNOSIS — R109 Unspecified abdominal pain: Secondary | ICD-10-CM | POA: Diagnosis present

## 2020-05-26 MED ORDER — IOHEXOL 300 MG/ML  SOLN
100.0000 mL | Freq: Once | INTRAMUSCULAR | Status: AC | PRN
  Administered 2020-05-26: 100 mL via INTRAVENOUS

## 2020-05-26 MED ORDER — IOHEXOL 9 MG/ML PO SOLN
ORAL | Status: AC
  Filled 2020-05-26: qty 1000

## 2020-05-26 MED ORDER — PROMETHAZINE HCL 25 MG PO TABS: ORAL_TABLET | ORAL | 0 refills | Status: DC

## 2020-05-26 NOTE — Telephone Encounter (Signed)
Pt is at hospital now waiting to do ct scan. She states she would like to try phenergan because zofran is not helping much.  [facility]

## 2020-05-26 NOTE — Telephone Encounter (Signed)
Patient having vomiting now and hasn't been able to get stool sample because she isn't going to the bathroom. Needing advice on that.  Also checking on CT scan status, also needs to know if she is suppose to keep the appt she already has here tomorrow that was originally for a med check.

## 2020-05-26 NOTE — Telephone Encounter (Signed)
Med sent to pharm. Pt notified.  

## 2020-05-26 NOTE — Telephone Encounter (Signed)
Because of her nausea with vomiting I recommended Zofran If that is not doing well enough we can use Phenergan but Phenergan can cause drowsiness and therefore no driving If the CAT scan can get done today we can base follow-up based on the findings

## 2020-05-26 NOTE — Telephone Encounter (Signed)
See message below and also I called pt to get more information. Pt states her pain is about the same. Still not eating but keeping liquids down and urinating normal. No fever. Has been vomiting periodically she states. Not able to get stool studies because diarrhea has stopped. Approval for ct just came in this morning and brendale is going to schedule. I put order in as a stat so hopefully will be scheduled today. She is working on that now.

## 2020-05-26 NOTE — Telephone Encounter (Signed)
Phenergan 25 mg Half tablet every 8 hours as needed nausea #20 caution drowsiness

## 2020-05-27 ENCOUNTER — Encounter: Payer: Self-pay | Admitting: Internal Medicine

## 2020-05-27 ENCOUNTER — Ambulatory Visit (INDEPENDENT_AMBULATORY_CARE_PROVIDER_SITE_OTHER): Payer: 59 | Admitting: Family Medicine

## 2020-05-27 ENCOUNTER — Encounter: Payer: Self-pay | Admitting: Family Medicine

## 2020-05-27 VITALS — BP 128/74 | Temp 97.9°F | Wt 138.2 lb

## 2020-05-27 DIAGNOSIS — R1031 Right lower quadrant pain: Secondary | ICD-10-CM | POA: Diagnosis not present

## 2020-05-27 NOTE — Progress Notes (Signed)
   Subjective:    Patient ID: Stacey Compton, female    DOB: 1986-08-20, 34 y.o.   MRN: [id number]  HPI Pt here for follow up on medications and abdominal pain.  Abdominal pain is not really any better. Pt states she is able to keep fluids in but unable to eat . Pt doing well with all meds. Pt taking meds as directed.  Patient with ongoing discomfort in the lower abdominal region.  Patient denies any high fever chills is having some loose stools no mucousy or bloody stools.  Denies chest tightness pressure pain shortness of breath  Review of Systems See above    Objective:   Physical Exam  Lungs clear respiratory normal heart regular no murmurs abdomen is soft subjective soreness and tenderness in the lower abdomen CT scan negative for any tumor negative for abscess     Assessment & Plan:  Lower abdominal tenderness could be IBS Double up on dicyclomine If progressive symptoms or worse follow-up Follow-up with gastroenterology May try to get her earlier appointment but it depends on what her gynecologist sees with the ultrasound Patient will send [place] an update within the next couple days regarding that Warning signs were discussed

## 2020-07-08 ENCOUNTER — Telehealth: Payer: Self-pay | Admitting: Adult Health

## 2020-07-08 NOTE — Telephone Encounter (Signed)
Received a PA request from [facility] for Nurtec. PA has been completed on [email or web address]. Key is [id number]. Per CMM, determination will be made within 1-3 business days. Will check back later for determination.

## 2020-07-21 ENCOUNTER — Encounter: Payer: Self-pay | Admitting: Gastroenterology

## 2020-07-21 ENCOUNTER — Other Ambulatory Visit: Payer: Self-pay

## 2020-07-21 ENCOUNTER — Ambulatory Visit (INDEPENDENT_AMBULATORY_CARE_PROVIDER_SITE_OTHER): Payer: 59 | Admitting: Gastroenterology

## 2020-07-21 VITALS — BP 102/63 | HR 78 | Temp 97.5°F | Ht 63.0 in | Wt 137.0 lb

## 2020-07-21 DIAGNOSIS — R197 Diarrhea, unspecified: Secondary | ICD-10-CM | POA: Insufficient documentation

## 2020-07-21 DIAGNOSIS — R1010 Upper abdominal pain, unspecified: Secondary | ICD-10-CM

## 2020-07-21 DIAGNOSIS — R109 Unspecified abdominal pain: Secondary | ICD-10-CM | POA: Insufficient documentation

## 2020-07-21 DIAGNOSIS — R112 Nausea with vomiting, unspecified: Secondary | ICD-10-CM | POA: Diagnosis not present

## 2020-07-21 DIAGNOSIS — R1031 Right lower quadrant pain: Secondary | ICD-10-CM | POA: Insufficient documentation

## 2020-07-21 DIAGNOSIS — G8929 Other chronic pain: Secondary | ICD-10-CM | POA: Insufficient documentation

## 2020-07-21 DIAGNOSIS — R1030 Lower abdominal pain, unspecified: Secondary | ICD-10-CM

## 2020-07-21 NOTE — Progress Notes (Signed)
Primary Care Physician:  Babs Sciara, MD  Primary Gastroenterologist:  Hennie Duos. Marletta Lor, DO   Chief Complaint  Patient presents with  . Abdominal Pain  . Diarrhea  . Nausea    some vomiting     HPI:  Stacey Compton is a 34 y.o. female here at the request of Dr. Lilyan Punt for further evaluation of abdominal pain, diarrhea, nausea.   Patient presents with complaints of several month history of diarrhea.  She underwent cholecystectomy in January 2021 for sudden onset nausea vomiting, abdominal pain.  Pathology showed chronic cholecystitis with cholelithiasis.  Approximately 2 to 3 months after her gallbladder was removed, her diarrhea was improving.  She finally was getting adjusted to having her gallbladder out.  However since March she states everything she eats causes diarrhea.  No normal stools.  She tried Questran which did seem to help but she overshot had constipation.  Initially took twice daily, then back down to once daily but still constipated.  Also increased her nausea.  If she has severe diarrhea for a couple of days she will take Lomotil which seems to help.  At least twice a month she wakes up drenched in sweat.  She has significant vomiting and diarrhea if she strays away from a very bland diet.  Basically consuming try cereals, pasta,..  Anything else will cause severe cramps and diarrhea.  Prior to getting sick she considers herself a part-time vegetarian.  Occasionally consumes chicken.  No red meats for years.  Stays away from greasy foods for the past 5 years because she noticed some stomach upset.  Used to tolerate dairy but now seems to bother her.  Used to be able to consume salads but now she has a lot of cramping and diarrhea.  No longer tolerates cooked vegetables.  Snacking on crackers just keep going.  At least 2-3 bad days per week having 5-10 stools with urgency and cramps.  Other days has about 2 loose stools daily.  No blood in the stool.  In addition to  abdominal cramping sometimes she feels pain in the upper abdomen about once per month, like she has been punched.  This usually occurs after episodes of vomiting and diarrhea.  She feels like her symptoms are triggering her migraines.  Reflux seems to do well with Pepcid and Tums.  Denies any weight loss but feels like her clothes are fitting looser.  Also with history of endometriosis.  Typically has vomiting with her.'s.  Last cycle 2 weeks ago.  Nexplanon placed 1 month ago.  Denies any recent tick bites.  CT abdomen pelvis with contrast dated May 26, 2020: Mild fatty liver, normal appendix.   Sister has a history of Crohn's.     Current Outpatient Medications  Medication Sig Dispense Refill  . citalopram (CELEXA) 20 MG tablet Take 1 tablet (20 mg total) by mouth at bedtime. 30 tablet 5  . clonazePAM (KLONOPIN) 0.5 MG tablet Take 1/2-1 tablet po BID prn anxiety 24 tablet 1  . diphenoxylate-atropine (LOMOTIL) 2.5-0.025 MG tablet Take 1 tablet by mouth 4 (four) times daily as needed for diarrhea or loose stools. 24 tablet 0  . Erenumab-aooe 140 MG/ML SOAJ Inject 140 mg into the skin every 30 (thirty) days. 3 pen 3  . famotidine (PEPCID) 40 MG tablet Take one tablet po every morning 30 tablet 5  . Rimegepant Sulfate (NURTEC) 75 MG TBDP Take 75 mg by mouth daily as needed. Take at the onset of migraine.1  tablet/24 hours 10 tablet 5  . SUMAtriptan (IMITREX) 50 MG tablet Take 1 tablet (50 mg total) by mouth once as needed for up to 1 dose for migraine. May repeat in 2 hours if headache persists or recurs. 10 tablet 12   Current Facility-Administered Medications  Medication Dose Route Frequency Provider Last Rate Last Admin  . methylPREDNISolone acetate (DEPO-MEDROL) injection 40 mg  40 mg Intra-Lesional Once Merlyn AlbertLuking, William S, MD        Allergies as of 07/21/2020 - Review Complete 07/21/2020  Allergen Reaction Noted  . Hydrocodone-homatropine Nausea And Vomiting and Rash 09/03/2015  .  Percocet [oxycodone-acetaminophen] Hives and Rash 10/02/2013    Past Medical History:  Diagnosis Date  . Anemia   . Anxiety   . Depression    denies  . Endometriosis   . Hx of varicella   . IBS (irritable bowel syndrome)   . Infection    UTI  . Migraines    with aura  . PONV (postoperative nausea and vomiting)     Past Surgical History:  Procedure Laterality Date  . CHOLECYSTECTOMY N/A 11/18/2019   Procedure: LAPAROSCOPIC CHOLECYSTECTOMY;  Surgeon: Lucretia RoersBridges, Lindsay C, MD;  Location: [facility];  Service: General;  Laterality: N/A;  . LAPAROSCOPY     with chromopertubation  . OTHER SURGICAL HISTORY     dx lap for endometriosis    Family History  Problem Relation Age of Onset  . Fibromyalgia Mother   . Diabetes Mother        borderline  . Depression Father   . Diabetes Father        type 2  . Colon polyps Father        >60  . Anxiety disorder Sister   . Crohn's disease Sister   . Bipolar disorder Maternal Aunt   . Cancer Maternal Grandmother        mouth and lung  . Bipolar disorder Maternal Grandfather   . Stroke Maternal Grandfather   . Heart disease Paternal Grandfather   . Colon cancer Cousin        maternal  . Celiac disease Neg Hx     Social History   Socioeconomic History  . Marital status: Married    Spouse name: Not on file  . Number of children: Not on file  . Years of education: Not on file  . Highest education level: Not on file  Occupational History  . Not on file  Tobacco Use  . Smoking status: Never Smoker  . Smokeless tobacco: Never Used  Vaping Use  . Vaping Use: Never used  Substance and Sexual Activity  . Alcohol use: No  . Drug use: No  . Sexual activity: Yes    Birth control/protection: None  Other Topics Concern  . Not on file  Social History Narrative  . Not on file   Social Determinants of Health   Financial Resource Strain:   . Difficulty of Paying Living Expenses: Not on file  Food Insecurity:   . Worried About  Programme researcher, broadcasting/film/videounning Out of Food in the Last Year: Not on file  . Ran Out of Food in the Last Year: Not on file  Transportation Needs:   . Lack of Transportation (Medical): Not on file  . Lack of Transportation (Non-Medical): Not on file  Physical Activity:   . Days of Exercise per Week: Not on file  . Minutes of Exercise per Session: Not on file  Stress:   . Feeling of Stress : Not  on file  Social Connections:   . Frequency of Communication with Friends and Family: Not on file  . Frequency of Social Gatherings with Friends and Family: Not on file  . Attends Religious Services: Not on file  . Active Member of Clubs or Organizations: Not on file  . Attends Banker Meetings: Not on file  . Marital Status: Not on file  Intimate Partner Violence:   . Fear of Current or Ex-Partner: Not on file  . Emotionally Abused: Not on file  . Physically Abused: Not on file  . Sexually Abused: Not on file      ROS:  General: Negative for  weight loss, fever, chills, fatigue, weakness.  See HPI Eyes: Negative for vision changes.  ENT: Negative for hoarseness, difficulty swallowing , nasal congestion. CV: Negative for chest pain, angina, palpitations, dyspnea on exertion, peripheral edema.  Respiratory: Negative for dyspnea at rest, dyspnea on exertion, cough, sputum, wheezing.  GI: See history of present illness. GU:  Negative for dysuria, hematuria, urinary incontinence, urinary frequency, nocturnal urination.  See HPI MS: Negative for joint pain, low back pain.  Derm: Negative for rash or itching.  Neuro: Negative for weakness, abnormal sensation, seizure, frequent headaches, memory loss, confusion.  Psych: Negative for anxiety, depression, suicidal ideation, hallucinations.  Endo: Negative for unusual weight change.  Heme: Negative for bruising or bleeding. Allergy: Negative for rash or hives.    Physical Examination:  BP 102/63   Pulse 78   Temp (!) 97.5 F (36.4 C) (Temporal)   Ht  5\' 3"  (1.6 m)   Wt 137 lb (62.1 kg)   LMP 07/03/2020 (Exact Date)   BMI 24.27 kg/m    General: Well-nourished, well-developed in no acute distress.  Head: Normocephalic, atraumatic.   Eyes: Conjunctiva pink, no icterus. Mouth:masked Neck: Supple without thyromegaly, masses, or lymphadenopathy.  Lungs: Clear to auscultation bilaterally.  Heart: Regular rate and rhythm, no murmurs rubs or gallops.  Abdomen: Bowel sounds are normal, nontender, nondistended, no hepatosplenomegaly or masses, no abdominal bruits or    hernia , no rebound or guarding.   Rectal: Not performed Extremities: No lower extremity edema. No clubbing or deformities.  Neuro: Alert and oriented x 4 , grossly normal neurologically.  Skin: Warm and dry, no rash or jaundice.   Psych: Alert and cooperative, normal mood and affect.  Labs: Lab Results  Component Value Date   LIPASE 27 05/20/2020   Lab Results  Component Value Date   CREATININE 0.63 05/20/2020   BUN 6 05/20/2020   NA 136 05/20/2020   K 4.1 05/20/2020   CL 98 05/20/2020   CO2 27 05/20/2020   Lab Results  Component Value Date   ALT 16 05/20/2020   AST 19 05/20/2020   ALKPHOS 56 05/20/2020   BILITOT 0.5 05/20/2020   Lab Results  Component Value Date   WBC 10.9 (H) 05/20/2020   HGB 14.5 05/20/2020   HCT 43.4 05/20/2020   MCV 94 05/20/2020   PLT 304 05/20/2020     Imaging Studies: No results found.   Impression/plan: Pleasant 34 year old female presenting with diarrhea, upper abdominal pain, abdominal cramping, nausea with intermittent vomiting.  Chronically has had GI issues with certain foods.  Developed significant diarrhea post cholecystectomy back in January but this seemed to subside after several weeks.  Now complaining of 6 months of persisting diarrhea.  Differential diagnosis includes bile salt diarrhea, IBS, celiac disease, IBD, less likely infectious process.  Complains of upper abdominal pain,  nausea off and on chronically.   Typical reflux controlled with Pepcid over-the-counter antacids.  Obtain labs and stool studies.  Continue supportive measures with Imodium or Lomotil for now.  Based on results, she may require colonoscopy plus or minus upper endoscopy.

## 2020-07-21 NOTE — Patient Instructions (Signed)
1. Please complete stool test and lab work soon as you can. 2. You can continue to use Imodium or Lomotil as needed to control diarrhea.  Using lowest dose possible to avoid constipation. 3. We will contact you with results as soon as they are available, further recommendations pending results.

## 2020-07-23 ENCOUNTER — Other Ambulatory Visit: Payer: Self-pay | Admitting: Gastroenterology

## 2020-07-26 LAB — GI PROFILE, STOOL, PCR

## 2020-07-27 ENCOUNTER — Telehealth: Payer: Self-pay | Admitting: Adult Health

## 2020-07-27 LAB — C DIFFICILE TOXINS A+B W/RFLX: C difficile Toxins A+B, EIA: NEGATIVE

## 2020-07-27 LAB — C DIFFICILE, CYTOTOXIN B

## 2020-07-27 NOTE — Progress Notes (Signed)
Cc'ed to pcp °

## 2020-07-27 NOTE — Telephone Encounter (Signed)
[employer] engineer) called physician has additional questions for PA for Rimegepant Sulfate (NURTEC) 75 MG TBDP.

## 2020-07-29 LAB — CBC WITH DIFFERENTIAL/PLATELET
Basophils Absolute: 0 10*3/uL (ref 0.0–0.2)
Basos: 1 %
EOS (ABSOLUTE): 0.2 10*3/uL (ref 0.0–0.4)
Eos: 2 %
Hematocrit: 42.2 % (ref 34.0–46.6)
Hemoglobin: 14.5 g/dL (ref 11.1–15.9)
Immature Grans (Abs): 0 10*3/uL (ref 0.0–0.1)
Immature Granulocytes: 0 %
Lymphocytes Absolute: 2.4 10*3/uL (ref 0.7–3.1)
Lymphs: 33 %
MCH: 32.3 pg (ref 26.6–33.0)
MCHC: 34.4 g/dL (ref 31.5–35.7)
MCV: 94 fL (ref 79–97)
Monocytes Absolute: 0.3 10*3/uL (ref 0.1–0.9)
Monocytes: 5 %
Neutrophils Absolute: 4.3 10*3/uL (ref 1.4–7.0)
Neutrophils: 59 %
Platelets: 285 10*3/uL (ref 150–450)
RBC: 4.49 x10E6/uL (ref 3.77–5.28)
RDW: 11.7 % (ref 11.7–15.4)
WBC: 7.2 10*3/uL (ref 3.4–10.8)

## 2020-07-29 LAB — ALPHA-GAL PANEL
Alpha Gal IgE*: <0.1 kU/L (ref <0.10)
Beef (Bos spp) IgE: <0.1 kU/L (ref <0.35)
Class Interpretation: 0
Class Interpretation: 0
Class Interpretation: 0
Lamb/Mutton (Ovis spp) IgE: <0.1 kU/L (ref <0.35)
Pork (Sus spp) IgE: <0.1 kU/L (ref <0.35)

## 2020-07-29 LAB — IGA: IgA/Immunoglobulin A, Serum: 405 mg/dL — ABNORMAL HIGH (ref 87–352)

## 2020-07-29 LAB — SEDIMENTATION RATE: Sed Rate: 4 mm/hr (ref 0–32)

## 2020-07-29 LAB — C-REACTIVE PROTEIN: CRP: <1 mg/L (ref 0–10)

## 2020-07-29 LAB — TISSUE TRANSGLUTAMINASE, IGA: Transglutaminase IgA: <2 U/mL (ref 0–3)

## 2020-07-29 NOTE — Telephone Encounter (Signed)
Information has been faxed back to [employer].

## 2020-08-03 ENCOUNTER — Encounter: Payer: Self-pay | Admitting: *Deleted

## 2020-08-19 ENCOUNTER — Other Ambulatory Visit: Payer: Self-pay

## 2020-08-19 ENCOUNTER — Encounter: Payer: Self-pay | Admitting: Adult Health

## 2020-08-19 ENCOUNTER — Ambulatory Visit (INDEPENDENT_AMBULATORY_CARE_PROVIDER_SITE_OTHER): Payer: 59 | Admitting: Adult Health

## 2020-08-19 VITALS — BP 100/68 | HR 71 | Ht 63.0 in | Wt 137.0 lb

## 2020-08-19 DIAGNOSIS — G43109 Migraine with aura, not intractable, without status migrainosus: Secondary | ICD-10-CM

## 2020-08-19 MED ORDER — AJOVY 225 MG/1.5ML ~~LOC~~ SOAJ: 225.0000 mg | SUBCUTANEOUS | 5 refills | Status: DC

## 2020-08-19 NOTE — Progress Notes (Signed)
PATIENT: Stacey Compton DOB: 1986/04/08  REASON FOR VISIT: follow up HISTORY FROM: patient  HISTORY OF PRESENT ILLNESS: Today 08/19/20:  Stacey Compton is a 34 year old female with a history of migraine headaches.  She returns today for follow-up.  She remains on Aimovig and Nurtec.  She states that in the last 3 months her headache frequency has increased.  Some weeks she has a daily headache other week she may have a headache 2 to 3 times a week.  She reports that the intensity of her headaches have decreased.  She states that Nurtec usually resolves her headache.  If she has a severe headache she will take Imitrex.  She returns today for an evaluation.  HISTORY 02/18/20:  Stacey Compton is a 34 year old female with a history of migraine headaches.  She returns today for follow-up.  She continues on Aimovig and Nurtec.  She reports that she has approximately 2 headaches a month but they may last 2 to 3 days.  She states that Nurtec does give her some benefit but does not resolve the headache.  She reports she has noticed recently that she will get changes in her peripheral vision bilaterally prior to her headache.  She describes it as a bug running across the room.  She returns today for an evaluation  REVIEW OF SYSTEMS: Out of a complete 14 system review of symptoms, the patient complains only of the following symptoms, and all other reviewed systems are negative.  See HPI  ALLERGIES: Allergies  Allergen Reactions  . Hydrocodone-Homatropine Nausea And Vomiting and Rash    Hycodan   . Percocet [Oxycodone-Acetaminophen] Hives and Rash    HOME MEDICATIONS: Outpatient Medications Prior to Visit  Medication Sig Dispense Refill  . citalopram (CELEXA) 20 MG tablet Take 1 tablet (20 mg total) by mouth at bedtime. 30 tablet 5  . clonazePAM (KLONOPIN) 0.5 MG tablet Take 1/2-1 tablet po BID prn anxiety 24 tablet 1  . diphenoxylate-atropine (LOMOTIL) 2.5-0.025 MG tablet Take 1 tablet by mouth 4  (four) times daily as needed for diarrhea or loose stools. 24 tablet 0  . Erenumab-aooe 140 MG/ML SOAJ Inject 140 mg into the skin every 30 (thirty) days. 3 pen 3  . Etonogestrel (NEXPLANON [place]) Inject into the skin.    . famotidine (PEPCID) 40 MG tablet Take one tablet po every morning 30 tablet 5  . Rimegepant Sulfate (NURTEC) 75 MG TBDP Take 75 mg by mouth daily as needed. Take at the onset of migraine.1 tablet/24 hours 10 tablet 5  . SUMAtriptan (IMITREX) 50 MG tablet Take 1 tablet (50 mg total) by mouth once as needed for up to 1 dose for migraine. May repeat in 2 hours if headache persists or recurs. 10 tablet 12   Facility-Administered Medications Prior to Visit  Medication Dose Route Frequency Provider Last Rate Last Admin  . methylPREDNISolone acetate (DEPO-MEDROL) injection 40 mg  40 mg Intra-Lesional Once Merlyn Albert, MD        PAST MEDICAL HISTORY: Past Medical History:  Diagnosis Date  . Anemia   . Anxiety   . Depression    denies  . Endometriosis   . Hx of varicella   . IBS (irritable bowel syndrome)   . Infection    UTI  . Migraines    with aura  . PONV (postoperative nausea and vomiting)     PAST SURGICAL HISTORY: Past Surgical History:  Procedure Laterality Date  . CHOLECYSTECTOMY N/A 11/18/2019   Procedure: LAPAROSCOPIC  CHOLECYSTECTOMY;  Surgeon: [name], MD;  Location: [facility];  Service: General;  Laterality: N/A;  . LAPAROSCOPY     with chromopertubation  . OTHER SURGICAL HISTORY     dx lap for endometriosis    FAMILY HISTORY: Family History  Problem Relation Age of Onset  . Fibromyalgia Mother   . Diabetes Mother        borderline  . Depression Father   . Diabetes Father        type 2  . Colon polyps Father        >60  . Anxiety disorder Sister   . Crohn's disease Sister   . Bipolar disorder Maternal Aunt   . Cancer Maternal Grandmother        mouth and lung  . Bipolar disorder Maternal Grandfather   . Stroke Maternal  Grandfather   . Heart disease Paternal Grandfather   . Colon cancer Cousin        maternal  . Celiac disease Neg Hx     SOCIAL HISTORY: Social History   Socioeconomic History  . Marital status: Married    Spouse name: Not on file  . Number of children: Not on file  . Years of education: Not on file  . Highest education level: Not on file  Occupational History  . Not on file  Tobacco Use  . Smoking status: Never Smoker  . Smokeless tobacco: Never Used  Vaping Use  . Vaping Use: Never used  Substance and Sexual Activity  . Alcohol use: No  . Drug use: No  . Sexual activity: Yes    Birth control/protection: None  Other Topics Concern  . Not on file  Social History Narrative  . Not on file   Social Determinants of Health   Financial Resource Strain:   . Difficulty of Paying Living Expenses: Not on file  Food Insecurity:   . Worried About Programme researcher, broadcasting/film/video in the Last Year: Not on file  . Ran Out of Food in the Last Year: Not on file  Transportation Needs:   . Lack of Transportation (Medical): Not on file  . Lack of Transportation (Non-Medical): Not on file  Physical Activity:   . Days of Exercise per Week: Not on file  . Minutes of Exercise per Session: Not on file  Stress:   . Feeling of Stress : Not on file  Social Connections:   . Frequency of Communication with Friends and Family: Not on file  . Frequency of Social Gatherings with Friends and Family: Not on file  . Attends Religious Services: Not on file  . Active Member of Clubs or Organizations: Not on file  . Attends Banker Meetings: Not on file  . Marital Status: Not on file  Intimate Partner Violence:   . Fear of Current or Ex-Partner: Not on file  . Emotionally Abused: Not on file  . Physically Abused: Not on file  . Sexually Abused: Not on file      PHYSICAL EXAM  Vitals:   08/19/20 1412  BP: 100/68  Pulse: 71  Weight: 137 lb (62.1 kg)  Height: 5\' 3"  (1.6 m)   Body mass  index is 24.27 kg/m.  Generalized: Well developed, in no acute distress   Neurological examination  Mentation: Alert oriented to time, place, history taking. Follows all commands speech and language fluent Cranial nerve II-XII: Pupils were equal round reactive to light. Extraocular movements were full, visual field were full on confrontational  test.  Head turning and shoulder shrug  were normal and symmetric. Motor: The motor testing reveals 5 over 5 strength of all 4 extremities. Good symmetric motor tone is noted throughout.  Sensory: Sensory testing is intact to soft touch on all 4 extremities. No evidence of extinction is noted.  Coordination: Cerebellar testing reveals good finger-nose-finger and heel-to-shin bilaterally.  Gait and station: Gait is normal. Reflexes: Deep tendon reflexes are symmetric and normal bilaterally.   DIAGNOSTIC DATA (LABS, IMAGING, TESTING) - I reviewed patient records, labs, notes, testing and imaging myself where available.  Lab Results  Component Value Date   WBC 7.2 07/23/2020   HGB 14.5 07/23/2020   HCT 42.2 07/23/2020   MCV 94 07/23/2020   PLT 285 07/23/2020      Component Value Date/Time   NA 136 05/20/2020 1417   K 4.1 05/20/2020 1417   CL 98 05/20/2020 1417   CO2 27 05/20/2020 1417   GLUCOSE 111 (H) 05/20/2020 1417   GLUCOSE 105 (H) 10/23/2019 1730   BUN 6 05/20/2020 1417   CREATININE 0.63 05/20/2020 1417   CALCIUM 9.4 05/20/2020 1417   PROT 7.4 05/20/2020 1417   ALBUMIN 4.6 05/20/2020 1417   AST 19 05/20/2020 1417   ALT 16 05/20/2020 1417   ALKPHOS 56 05/20/2020 1417   BILITOT 0.5 05/20/2020 1417   GFRNONAA 117 05/20/2020 1417   GFRAA 134 05/20/2020 1417    Lab Results  Component Value Date   TSH 1.300 10/11/2019      ASSESSMENT AND PLAN 34 y.o. year old female  has a past medical history of Anemia, Anxiety, Depression, Endometriosis, varicella, IBS (irritable bowel syndrome), Infection, Migraines, and PONV (postoperative  nausea and vomiting). here with:  1.  Migraine headaches  -Stop Aimovig -Start Ajovy--if this is not approved with her insurance we can try Emgality. -Discussed Botox however the patient would like to try ajovy first.  -Continue Nurtec or Imitrex as abortive therapy -Advised if her headache frequency or severity worsen she should let us know -Follow-up in 6 months or sooner if needed   I spent 30 minutes of face-to-face and non-face-to-face time with patient.  This included previsit chart review, lab review, study review, order entry, electronic health record documentation, patient education.  Butch Penny, MSN, NP-C 08/19/2020, 2:33 PM [facility] [address] [place], [place] [address] [phone]

## 2020-08-19 NOTE — Progress Notes (Signed)
I have read the note, and I agree with the clinical assessment and plan.  Anysa Tacey A. Blenda Wisecup, MD, PhD, FAAN Certified in Neurology, Clinical Neurophysiology, Sleep Medicine, Pain Medicine and Neuroimaging  [name] [address], [address] [place], [place] [address] [phone]  

## 2020-08-19 NOTE — Patient Instructions (Signed)
Your Plan:  Continue nurtec or Imitrex for abortive therapy Stop Aimovig  Start Ajovy   Thank you for coming to see [place] at [facility]. I hope we have been able to provide you high quality care today.  You may receive a patient satisfaction survey over the next few weeks. We would appreciate your feedback and comments so that we may continue to improve ourselves and the health of our patients.

## 2020-08-25 ENCOUNTER — Telehealth: Payer: Self-pay | Admitting: Adult Health

## 2020-08-25 NOTE — Telephone Encounter (Signed)
Received a PA request from pharmacy for Ajovy. PA was started via [email or web address]. Key is [id number]. Per [email or web address], a determination will be made within the next 72 hours.   Will check back later for a determination.

## 2020-08-31 ENCOUNTER — Telehealth: Payer: Self-pay | Admitting: Internal Medicine

## 2020-08-31 NOTE — Telephone Encounter (Signed)
Patient was mailed letter back in September to call to schedule tcs/egd w/ Dr. Marletta Lor, ASA 2

## 2020-08-31 NOTE — Telephone Encounter (Signed)
Pt said she was seen by Dorris Fetch PA on 07/21/2020 and was calling to schedule her procedure. [phone]

## 2020-08-31 NOTE — Telephone Encounter (Signed)
Called pt. She has been scheduled for 11/5 at 10:30am. Aware will send prep instructions with her covid test appt to her [facility]. She voiced understanding

## 2020-09-03 NOTE — Telephone Encounter (Signed)
Checked status on [email or web address]. PA was denied but no information was given. Called [employer] to have the denial fax sent to our office. Will await fax.

## 2020-09-10 ENCOUNTER — Other Ambulatory Visit: Payer: Self-pay

## 2020-09-10 ENCOUNTER — Other Ambulatory Visit (HOSPITAL_COMMUNITY)
Admission: RE | Admit: 2020-09-10 | Discharge: 2020-09-10 | Disposition: A | Discharge status: Home / Self Care | Payer: 59 | Source: Ambulatory Visit | Attending: Internal Medicine | Admitting: Internal Medicine

## 2020-09-10 DIAGNOSIS — Z01818 Encounter for other preprocedural examination: Secondary | ICD-10-CM | POA: Diagnosis present

## 2020-09-10 DIAGNOSIS — Z20822 Contact with and (suspected) exposure to covid-19: Secondary | ICD-10-CM | POA: Diagnosis not present

## 2020-09-10 DIAGNOSIS — Z79899 Other long term (current) drug therapy: Secondary | ICD-10-CM | POA: Diagnosis not present

## 2020-09-10 DIAGNOSIS — K529 Noninfective gastroenteritis and colitis, unspecified: Secondary | ICD-10-CM | POA: Diagnosis not present

## 2020-09-10 DIAGNOSIS — K2980 Duodenitis without bleeding: Secondary | ICD-10-CM | POA: Diagnosis not present

## 2020-09-10 DIAGNOSIS — K648 Other hemorrhoids: Secondary | ICD-10-CM | POA: Diagnosis not present

## 2020-09-10 DIAGNOSIS — R1030 Lower abdominal pain, unspecified: Secondary | ICD-10-CM | POA: Diagnosis present

## 2020-09-10 DIAGNOSIS — Z885 Allergy status to narcotic agent status: Secondary | ICD-10-CM | POA: Diagnosis not present

## 2020-09-10 DIAGNOSIS — K319 Disease of stomach and duodenum, unspecified: Secondary | ICD-10-CM | POA: Diagnosis not present

## 2020-09-10 DIAGNOSIS — K2950 Unspecified chronic gastritis without bleeding: Secondary | ICD-10-CM | POA: Diagnosis not present

## 2020-09-10 LAB — SARS CORONAVIRUS 2 (TAT 6-24 HRS): SARS Coronavirus 2: NEGATIVE

## 2020-09-10 LAB — PREGNANCY, URINE: Preg Test, Ur: NEGATIVE

## 2020-09-11 ENCOUNTER — Ambulatory Visit (HOSPITAL_COMMUNITY): Payer: 59 | Admitting: Anesthesiology

## 2020-09-11 ENCOUNTER — Encounter (HOSPITAL_COMMUNITY): Payer: Self-pay

## 2020-09-11 ENCOUNTER — Ambulatory Visit (HOSPITAL_COMMUNITY)
Admission: RE | Admit: 2020-09-11 | Discharge: 2020-09-11 | Disposition: A | Discharge status: Home / Self Care | Payer: 59 | Attending: Internal Medicine | Admitting: Internal Medicine

## 2020-09-11 ENCOUNTER — Encounter (HOSPITAL_COMMUNITY)
Admission: RE | Disposition: A | Discharge status: Home / Self Care | Payer: Self-pay | Source: Home / Self Care | Attending: Internal Medicine

## 2020-09-11 ENCOUNTER — Other Ambulatory Visit: Payer: Self-pay

## 2020-09-11 DIAGNOSIS — K2970 Gastritis, unspecified, without bleeding: Secondary | ICD-10-CM | POA: Diagnosis not present

## 2020-09-11 DIAGNOSIS — K529 Noninfective gastroenteritis and colitis, unspecified: Secondary | ICD-10-CM | POA: Insufficient documentation

## 2020-09-11 DIAGNOSIS — K2950 Unspecified chronic gastritis without bleeding: Secondary | ICD-10-CM | POA: Insufficient documentation

## 2020-09-11 DIAGNOSIS — K648 Other hemorrhoids: Secondary | ICD-10-CM | POA: Insufficient documentation

## 2020-09-11 DIAGNOSIS — K319 Disease of stomach and duodenum, unspecified: Secondary | ICD-10-CM | POA: Insufficient documentation

## 2020-09-11 DIAGNOSIS — K2980 Duodenitis without bleeding: Secondary | ICD-10-CM | POA: Diagnosis not present

## 2020-09-11 DIAGNOSIS — Z79899 Other long term (current) drug therapy: Secondary | ICD-10-CM | POA: Insufficient documentation

## 2020-09-11 DIAGNOSIS — Z885 Allergy status to narcotic agent status: Secondary | ICD-10-CM | POA: Insufficient documentation

## 2020-09-11 HISTORY — PX: COLONOSCOPY WITH PROPOFOL: SHX5780

## 2020-09-11 HISTORY — PX: BIOPSY: SHX5522

## 2020-09-11 HISTORY — PX: ESOPHAGOGASTRODUODENOSCOPY (EGD) WITH PROPOFOL: SHX5813

## 2020-09-11 SURGERY — COLONOSCOPY WITH PROPOFOL
Anesthesia: General

## 2020-09-11 MED ORDER — PROPOFOL 10 MG/ML IV BOLUS
INTRAVENOUS | Status: DC | PRN
  Administered 2020-09-11: 20 mg via INTRAVENOUS
  Administered 2020-09-11 (×2): 50 mg via INTRAVENOUS
  Administered 2020-09-11: 40 mg via INTRAVENOUS

## 2020-09-11 MED ORDER — PANTOPRAZOLE SODIUM 40 MG PO TBEC: 40.0000 mg | DELAYED_RELEASE_TABLET | Freq: Every day | ORAL | 5 refills | Status: DC

## 2020-09-11 MED ORDER — LACTATED RINGERS IV SOLN: INTRAVENOUS | Status: DC | PRN

## 2020-09-11 MED ORDER — STERILE WATER FOR IRRIGATION IR SOLN
Status: DC | PRN
  Administered 2020-09-11: 200 mL

## 2020-09-11 MED ORDER — LACTATED RINGERS IV SOLN: INTRAVENOUS | Status: DC

## 2020-09-11 MED ORDER — LIDOCAINE VISCOUS HCL 2 % MT SOLN
OROMUCOSAL | Status: AC
  Administered 2020-09-11: 15 mL
  Filled 2020-09-11: qty 15

## 2020-09-11 MED ORDER — PROPOFOL 10 MG/ML IV BOLUS
INTRAVENOUS | Status: AC
  Filled 2020-09-11: qty 100

## 2020-09-11 MED ORDER — COLESTIPOL HCL 1 G PO TABS: 1.0000 g | ORAL_TABLET | Freq: Two times a day (BID) | ORAL | 5 refills | Status: DC

## 2020-09-11 MED ORDER — GLYCOPYRROLATE 0.2 MG/ML IJ SOLN
INTRAMUSCULAR | Status: AC
  Administered 2020-09-11: 0.2 mg
  Filled 2020-09-11: qty 1

## 2020-09-11 MED ORDER — PROPOFOL 500 MG/50ML IV EMUL
INTRAVENOUS | Status: DC | PRN
  Administered 2020-09-11: 150 ug/kg/min via INTRAVENOUS

## 2020-09-11 MED ORDER — LIDOCAINE HCL (CARDIAC) PF 100 MG/5ML IV SOSY
PREFILLED_SYRINGE | INTRAVENOUS | Status: DC | PRN
  Administered 2020-09-11: 50 mg via INTRAVENOUS

## 2020-09-11 NOTE — Discharge Instructions (Addendum)
EGD Discharge instructions Please read the instructions outlined below and refer to this sheet in the next few weeks. These discharge instructions provide you with general information on caring for yourself after you leave the hospital. Your doctor may also give you specific instructions. While your treatment has been planned according to the most current medical practices available, unavoidable complications occasionally occur. If you have any problems or questions after discharge, please call your doctor. ACTIVITY  You may resume your regular activity but move at a slower pace for the next 24 hours.   Take frequent rest periods for the next 24 hours.   Walking will help expel (get rid of) the air and reduce the bloated feeling in your abdomen.   No driving for 24 hours (because of the anesthesia (medicine) used during the test).   You may shower.   Do not sign any important legal documents or operate any machinery for 24 hours (because of the anesthesia used during the test).  NUTRITION  Drink plenty of fluids.   You may resume your normal diet.   Begin with a light meal and progress to your normal diet.   Avoid alcoholic beverages for 24 hours or as instructed by your caregiver.  MEDICATIONS  You may resume your normal medications unless your caregiver tells you otherwise.  WHAT YOU CAN EXPECT TODAY  You may experience abdominal discomfort such as a feeling of fullness or "gas" pains.  FOLLOW-UP  Your doctor will discuss the results of your test with you.  SEEK IMMEDIATE MEDICAL ATTENTION IF ANY OF THE FOLLOWING OCCUR:  Excessive nausea (feeling sick to your stomach) and/or vomiting.   Severe abdominal pain and distention (swelling).   Trouble swallowing.   Temperature over 101 F (37.8 C).   Rectal bleeding or vomiting of blood.     Colonoscopy Discharge Instructions  Read the instructions outlined below and refer to this sheet in the next few weeks. These  discharge instructions provide you with general information on caring for yourself after you leave the hospital. Your doctor may also give you specific instructions. While your treatment has been planned according to the most current medical practices available, unavoidable complications occasionally occur.   ACTIVITY  You may resume your regular activity, but move at a slower pace for the next 24 hours.   Take frequent rest periods for the next 24 hours.   Walking will help get rid of the air and reduce the bloated feeling in your belly (abdomen).   No driving for 24 hours (because of the medicine (anesthesia) used during the test).    Do not sign any important legal documents or operate any machinery for 24 hours (because of the anesthesia used during the test).  NUTRITION  Drink plenty of fluids.   You may resume your normal diet as instructed by your doctor.   Begin with a light meal and progress to your normal diet. Heavy or fried foods are harder to digest and may make you feel sick to your stomach (nauseated).   Avoid alcoholic beverages for 24 hours or as instructed.  MEDICATIONS  You may resume your normal medications unless your doctor tells you otherwise.  WHAT YOU CAN EXPECT TODAY  Some feelings of bloating in the abdomen.   Passage of more gas than usual.   Spotting of blood in your stool or on the toilet paper.  IF YOU HAD POLYPS REMOVED DURING THE COLONOSCOPY:  No aspirin products for 7 days or as instructed.  No alcohol for 7 days or as instructed.   Eat a soft diet for the next 24 hours.  FINDING OUT THE RESULTS OF YOUR TEST Not all test results are available during your visit. If your test results are not back during the visit, make an appointment with your caregiver to find out the results. Do not assume everything is normal if you have not heard from your caregiver or the medical facility. It is important for you to follow up on all of your test results.    SEEK IMMEDIATE MEDICAL ATTENTION IF:  You have more than a spotting of blood in your stool.   Your belly is swollen (abdominal distention).   You are nauseated or vomiting.   You have a temperature over 101.   You have abdominal pain or discomfort that is severe or gets worse throughout the day.   Your EGD showed inflammation in your stomach which I biopsied to rule out bacteria called H. pylori.  I also biopsied your small bowel to rule out celiac disease.  Esophagus looked normal.  I want you to take a medicine called pantoprazole 40 mg daily for the next 8 weeks.  Change her Pepcid to as needed for breakthrough symptoms.  Your colonoscopy was relatively unremarkable.  I do not see any evidence of underlying inflammatory bowel disease.  I did take biopsies to rule out a condition called microscopic colitis.  Her diarrhea may be bile acid related and I would retry you on colestipol 1 mg twice daily.  I sent this to your pharmacy.  Follow-up with GI in 4-6 weeks to see how you are doing.  Office to call with follow-up appointment  I hope you have a great rest of your week!  Hennie Duos. Marletta Lor, D.O. Gastroenterology and Hepatology [facility]

## 2020-09-11 NOTE — Anesthesia Preprocedure Evaluation (Signed)
Anesthesia Evaluation  Patient identified by MRN, date of birth, ID band Patient awake    Reviewed: Allergy & Precautions, H&P , NPO status , Patient's Chart, lab work & pertinent test results, reviewed documented beta blocker date and time   History of Anesthesia Complications (+) PONV and history of anesthetic complications  Airway Mallampati: II  TM Distance: >3 FB Neck ROM: full    Dental no notable dental hx.    Pulmonary neg pulmonary ROS,    Pulmonary exam normal breath sounds clear to auscultation       Cardiovascular Exercise Tolerance: Good negative cardio ROS   Rhythm:regular Rate:Normal     Neuro/Psych  Headaches, PSYCHIATRIC DISORDERS Anxiety Depression    GI/Hepatic Neg liver ROS, GERD  Medicated,  Endo/Other  negative endocrine ROS  Renal/GU negative Renal ROS  negative genitourinary   Musculoskeletal   Abdominal   Peds  Hematology  (+) Blood dyscrasia, anemia ,   Anesthesia Other Findings   Reproductive/Obstetrics negative OB ROS                             Anesthesia Physical Anesthesia Plan  ASA: II  Anesthesia Plan: General   Post-op Pain Management:    Induction:   PONV Risk Score and Plan: Propofol infusion  Airway Management Planned:   Additional Equipment:   Intra-op Plan:   Post-operative Plan:   Informed Consent: I have reviewed the patients History and Physical, chart, labs and discussed the procedure including the risks, benefits and alternatives for the proposed anesthesia with the patient or authorized representative who has indicated his/her understanding and acceptance.     Dental Advisory Given  Plan Discussed with: CRNA  Anesthesia Plan Comments:         Anesthesia Quick Evaluation

## 2020-09-11 NOTE — Transfer of Care (Signed)
Immediate Anesthesia Transfer of Care Note  Patient: Stacey Compton  Procedure(s) Performed: COLONOSCOPY WITH PROPOFOL (N/A ) ESOPHAGOGASTRODUODENOSCOPY (EGD) WITH PROPOFOL (N/A ) BIOPSY  Patient Location: Endoscopy Unit  Anesthesia Type:General  Level of Consciousness: drowsy  Airway & Oxygen Therapy: Patient Spontanous Breathing  Post-op Assessment: Report given to RN and Post -op Vital signs reviewed and stable  Post vital signs: Reviewed and stable  Last Vitals:  Vitals Value Taken Time  BP    Temp    Pulse    Resp    SpO2      Last Pain:  Vitals:   09/11/20 0939  TempSrc:   PainSc: 0-No pain      Patients Stated Pain Goal: 7 (09/11/20 0843)  Complications: No complications documented.

## 2020-09-11 NOTE — Anesthesia Procedure Notes (Signed)
Date/Time: 09/11/2020 10:01 AM Performed by: Julian Reil, CRNA Pre-anesthesia Checklist: Patient identified, Emergency Drugs available, Patient being monitored and Suction available Patient Re-evaluated:Patient Re-evaluated prior to induction Oxygen Delivery Method: Nasal cannula Induction Type: IV induction Placement Confirmation: positive ETCO2

## 2020-09-11 NOTE — Op Note (Signed)
[facility] Patient Name: Stacey Compton Procedure Date: 09/11/2020 9:28 AM MRN: [id number] Date of Birth: 1986/07/11 Attending MD: Elon Alas. Abbey Chatters DO CSN: [id number] Age: 34 Admit Type: Outpatient Procedure:                Upper GI endoscopy Indications:              Epigastric abdominal pain Providers:                Elon Alas. Abbey Chatters, DO, Janeece Riggers, RN, Lambert Mody, Aram Candela Referring MD:              Medicines:                See the Anesthesia note for documentation of the                            administered medications Complications:            No immediate complications. Estimated Blood Loss:     Estimated blood loss was minimal. Procedure:                Pre-Anesthesia Assessment:                           - The anesthesia plan was to use monitored                            anesthesia care (MAC).                           After obtaining informed consent, the endoscope was                            passed under direct vision. Throughout the                            procedure, the patient's blood pressure, pulse, and                            oxygen saturations were monitored continuously. The                            GIF-H190 ([id number]) scope was introduced through the                            mouth, and advanced to the second part of duodenum.                            The upper GI endoscopy was accomplished without                            difficulty. The patient tolerated the procedure                            well. Scope In: 9:42:15  AM Scope Out: 9:45:47 AM Total Procedure Duration: 0 hours 3 minutes 32 seconds  Findings:      There is no endoscopic evidence of Barrett's esophagus, bleeding, hiatal       hernia, inflammation, ulcerations or varices in the entire esophagus.      Diffuse moderate inflammation characterized by erosions and erythema was       found in the entire examined stomach. Biopsies were  taken with a cold       forceps for Helicobacter pylori testing.      The duodenal bulb, first portion of the duodenum and second portion of       the duodenum were normal. Biopsies for histology were taken with a cold       forceps for evaluation of celiac disease. Impression:               - Gastritis. Biopsied.                           - Normal duodenal bulb, first portion of the                            duodenum and second portion of the duodenum.                            Biopsied. Moderate Sedation:      Per Anesthesia Care Recommendation:           - Patient has a contact number available for                            emergencies. The signs and symptoms of potential                            delayed complications were discussed with the                            patient. Return to normal activities tomorrow.                            Written discharge instructions were provided to the                            patient.                           - Resume previous diet.                           - Continue present medications.                           - Await pathology results.                           - Use a proton pump inhibitor PO daily for 8 weeks. Procedure Code(s):        --- Professional ---  45364, Esophagogastroduodenoscopy, flexible,                            transoral; with biopsy, single or multiple Diagnosis Code(s):        --- Professional ---                           K29.70, Gastritis, unspecified, without bleeding                           R10.13, Epigastric pain CPT copyright 2019 American Medical Association. All rights reserved. The codes documented in this report are preliminary and upon coder review may  be revised to meet current compliance requirements. Elon Alas. Abbey Chatters, DO [place] Abbey Chatters, DO 09/11/2020 10:04:31 AM This report has been signed electronically. Number of Addenda: 0

## 2020-09-11 NOTE — Anesthesia Postprocedure Evaluation (Signed)
Anesthesia Post Note  Patient: Archivist  Procedure(s) Performed: COLONOSCOPY WITH PROPOFOL (N/A ) ESOPHAGOGASTRODUODENOSCOPY (EGD) WITH PROPOFOL (N/A ) BIOPSY  Patient location during evaluation: Endoscopy Anesthesia Type: General Level of consciousness: awake and alert and oriented Pain management: pain level controlled Vital Signs Assessment: post-procedure vital signs reviewed and stable Respiratory status: respiratory function stable, nonlabored ventilation and spontaneous breathing Cardiovascular status: blood pressure returned to baseline and stable Postop Assessment: no apparent nausea or vomiting Anesthetic complications: no   No complications documented.   Last Vitals:  Vitals:   09/11/20 0843 09/11/20 1007  BP: 113/81 98/64  Pulse: (!) 106   Resp: 13 18  Temp: 37 C 36.8 C  SpO2: 100% 100%    Last Pain:  Vitals:   09/11/20 1007  TempSrc: Oral  PainSc: 0-No pain                 Julian Reil

## 2020-09-11 NOTE — Op Note (Signed)
[facility] Patient Name: Stacey Compton Procedure Date: 09/11/2020 9:47 AM MRN: [id number] Date of Birth: 1986-08-24 Attending MD: Elon Alas. Abbey Chatters DO CSN: [id number] Age: 34 Admit Type: Outpatient Procedure:                Colonoscopy Indications:              Lower abdominal pain, Chronic diarrhea Providers:                Elon Alas. Abbey Chatters, DO, Lambert Mody, Aram Candela Referring MD:              Medicines:                See the Anesthesia note for documentation of the                            administered medications Complications:            No immediate complications. Estimated Blood Loss:     Estimated blood loss was minimal. Procedure:                Pre-Anesthesia Assessment:                           - The anesthesia plan was to use monitored                            anesthesia care (MAC).                           After obtaining informed consent, the colonoscope                            was passed under direct vision. Throughout the                            procedure, the patient's blood pressure, pulse, and                            oxygen saturations were monitored continuously. The                            [id number]) was introduced through the anus                            and advanced to the the terminal ileum, with                            identification of the appendiceal orifice and IC                            valve. The colonoscopy was performed without                            difficulty. The patient tolerated the procedure  well. The quality of the bowel preparation was                            evaluated using the BBPS [facility] Bowel Preparation                            Scale) with scores of: Right Colon = 2 (minor                            amount of residual staining, small fragments of                            stool and/or opaque liquid, but mucosa seen well),                             Transverse Colon = 3 (entire mucosa seen well with                            no residual staining, small fragments of stool or                            opaque liquid) and Left Colon = 3 (entire mucosa                            seen well with no residual staining, small                            fragments of stool or opaque liquid). The total                            BBPS score equals 8. The quality of the bowel                            preparation was good. Scope In: 9:49:49 AM Scope Out: 10:04:05 AM Scope Withdrawal Time: 0 hours 8 minutes 29 seconds  Total Procedure Duration: 0 hours 14 minutes 16 seconds  Findings:      The perianal and digital rectal examinations were normal.      Non-bleeding internal hemorrhoids were found during endoscopy.      The terminal ileum appeared normal. Biopsies were taken with a cold       forceps for histology.      Biopsies for histology were taken with a cold forceps from the ascending       colon, transverse colon and descending colon for evaluation of       microscopic colitis.      The exam was otherwise without abnormality. Impression:               - Non-bleeding internal hemorrhoids.                           - The examined portion of the ileum was normal.  Biopsied.                           - The examination was otherwise normal.                           - Biopsies were taken with a cold forceps from the                            ascending colon, transverse colon and descending                            colon for evaluation of microscopic colitis. Moderate Sedation:      Per Anesthesia Care Recommendation:           - Patient has a contact number available for                            emergencies. The signs and symptoms of potential                            delayed complications were discussed with the                            patient. Return to normal activities  tomorrow.                            Written discharge instructions were provided to the                            patient.                           - Continue present medications.                           - Resume previous diet.                           - Await pathology results.                           - Repeat colonoscopy in 10 years for screening                            purposes.                           - Return to GI clinic in 1 month.                           - Consider trial of colestipol for bile acid                            diarrhea. 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 Procedure Code(s):        --- Professional ---                           [phone], Colonoscopy, flexible; with biopsy, single                            or multiple Diagnosis Code(s):        --- Professional ---                           K64.8, Other hemorrhoids                           R10.30, Lower abdominal pain, unspecified                           K52.9, Noninfective gastroenteritis and colitis,                            unspecified CPT copyright 2019 American Medical Association. All rights reserved. The codes documented in this report are preliminary and upon coder review may  be revised to meet current compliance requirements. Elon Alas. Abbey Chatters, DO [place] Abbey Chatters, DO 09/11/2020 10:07:38 AM This report has been signed electronically. Number of Addenda: 0

## 2020-09-14 ENCOUNTER — Other Ambulatory Visit: Payer: Self-pay

## 2020-09-14 LAB — SURGICAL PATHOLOGY

## 2020-09-15 NOTE — H&P (Signed)
Primary Care Physician:  Babs Sciara, MD Primary Gastroenterologist:  Dr. Marletta Lor  Pre-Procedure History & Physical: HPI:  Stacey Compton is a 34 y.o. female is here for an EGD and colonoscopy due to abdominal pain, diarrhea, nausea/vomiting.   Patient denies any family history of colorectal cancer.  No melena or hematochezia.  No unintentional weight loss.    Past Medical History:  Diagnosis Date  . Anemia   . Anxiety   . Depression    denies  . Endometriosis   . Hx of varicella   . IBS (irritable bowel syndrome)   . Infection    UTI  . Migraines    with aura  . PONV (postoperative nausea and vomiting)     Past Surgical History:  Procedure Laterality Date  . CHOLECYSTECTOMY N/A 11/18/2019   Procedure: LAPAROSCOPIC CHOLECYSTECTOMY;  Surgeon: [name], MD;  Location: [facility];  Service: General;  Laterality: N/A;  . LAPAROSCOPY     with chromopertubation  . OTHER SURGICAL HISTORY     dx lap for endometriosis    Prior to Admission medications   Medication Sig Start Date End Date Taking? Authorizing Provider  citalopram (CELEXA) 20 MG tablet Take 1 tablet (20 mg total) by mouth at bedtime. 03/19/20  Yes Merlyn Albert, MD  diphenoxylate-atropine (LOMOTIL) 2.5-0.025 MG tablet Take 1 tablet by mouth 4 (four) times daily as needed for diarrhea or loose stools. 05/12/20  Yes Luking, Jonna Coup, MD  Etonogestrel (NEXPLANON [employer]) Inject 1 Device into the skin once.    Yes [provider]  famotidine (PEPCID) 40 MG tablet Take one tablet po every morning Patient taking differently: Take 40 mg by mouth at bedtime. Take one tablet po every morning 03/19/20  Yes Merlyn Albert, MD  Fremanezumab-vfrm (AJOVY) 225 MG/1.5ML SOAJ Inject 225 mg into the skin every 30 (thirty) days. 08/19/20  Yes Butch Penny, NP  ondansetron (ZOFRAN) 8 MG tablet Take 8 mg by mouth every 8 (eight) hours as needed for nausea or vomiting (migraines.).   Yes [provider]  ORILISSA 150  MG TABS Take 150 mg by mouth at bedtime. 08/24/20  Yes [provider]  Rimegepant Sulfate (NURTEC) 75 MG TBDP Take 75 mg by mouth daily as needed. Take at the onset of migraine.1 tablet/24 hours Patient taking differently: Take 75 mg by mouth daily as needed (migraines Max 1 dose/24 hrs.).  10/28/19  Yes Butch Penny, NP  clonazePAM (KLONOPIN) 0.5 MG tablet Take 1/2-1 tablet po BID prn anxiety Patient taking differently: Take 0.25-0.5 mg by mouth 2 (two) times daily as needed for anxiety.  05/05/20   Babs Sciara, MD  colestipol (COLESTID) 1 g tablet Take 1 tablet (1 g total) by mouth 2 (two) times daily. 09/11/20 03/10/21  Lanelle Bal, DO  ibuprofen (ADVIL) 200 MG tablet Take 200 mg by mouth every 8 (eight) hours as needed (pain.).    [provider]  pantoprazole (PROTONIX) 40 MG tablet Take 1 tablet (40 mg total) by mouth daily. 09/11/20 03/10/21  Lanelle Bal, DO    Allergies as of 08/31/2020 - Review Complete 08/19/2020  Allergen Reaction Noted  . Hydrocodone-homatropine Nausea And Vomiting and Rash 09/03/2015  . Percocet [oxycodone-acetaminophen] Hives and Rash 10/02/2013    Family History  Problem Relation Age of Onset  . Fibromyalgia Mother   . Diabetes Mother        borderline  . Depression Father   . Diabetes Father  type 2  . Colon polyps Father        >60  . Anxiety disorder Sister   . Crohn's disease Sister   . Bipolar disorder Maternal Aunt   . Cancer Maternal Grandmother        mouth and lung  . Bipolar disorder Maternal Grandfather   . Stroke Maternal Grandfather   . Heart disease Paternal Grandfather   . Colon cancer Cousin        maternal  . Celiac disease Neg Hx     Social History   Socioeconomic History  . Marital status: Married    Spouse name: Not on file  . Number of children: Not on file  . Years of education: Not on file  . Highest education level: Not on file  Occupational History  . Not on file  Tobacco Use   . Smoking status: Never Smoker  . Smokeless tobacco: Never Used  Vaping Use  . Vaping Use: Never used  Substance and Sexual Activity  . Alcohol use: No  . Drug use: No  . Sexual activity: Yes    Birth control/protection: None  Other Topics Concern  . Not on file  Social History Narrative  . Not on file   Social Determinants of Health   Financial Resource Strain:   . Difficulty of Paying Living Expenses: Not on file  Food Insecurity:   . Worried About Programme researcher, broadcasting/film/video in the Last Year: Not on file  . Ran Out of Food in the Last Year: Not on file  Transportation Needs:   . Lack of Transportation (Medical): Not on file  . Lack of Transportation (Non-Medical): Not on file  Physical Activity:   . Days of Exercise per Week: Not on file  . Minutes of Exercise per Session: Not on file  Stress:   . Feeling of Stress : Not on file  Social Connections:   . Frequency of Communication with Friends and Family: Not on file  . Frequency of Social Gatherings with Friends and Family: Not on file  . Attends Religious Services: Not on file  . Active Member of Clubs or Organizations: Not on file  . Attends Banker Meetings: Not on file  . Marital Status: Not on file  Intimate Partner Violence:   . Fear of Current or Ex-Partner: Not on file  . Emotionally Abused: Not on file  . Physically Abused: Not on file  . Sexually Abused: Not on file    Review of Systems: See HPI, otherwise negative ROS  Impression/Plan: Stacey Compton is here for an EGD and colonoscopy due to abdominal pain, diarrhea, nausea/vomiting.   The risks of the procedure including infection, bleed, or perforation as well as benefits, limitations, alternatives and imponderables have been reviewed with the patient. Questions have been answered. All parties agreeable.

## 2020-09-16 ENCOUNTER — Encounter (HOSPITAL_COMMUNITY): Payer: Self-pay | Admitting: Internal Medicine

## 2020-09-16 ENCOUNTER — Other Ambulatory Visit: Payer: Self-pay | Admitting: *Deleted

## 2020-09-16 NOTE — Telephone Encounter (Signed)
May have 2 refills needs follow-up within 60 days

## 2020-09-17 NOTE — Telephone Encounter (Signed)
Sent mychart message to schedule follow up

## 2020-09-28 NOTE — Telephone Encounter (Signed)
Tried calling-no answer.  

## 2020-09-29 NOTE — Telephone Encounter (Signed)
Patient has no contacted office for visit Ive tried twice with phone and my chart message

## 2020-10-18 ENCOUNTER — Encounter: Payer: Self-pay | Admitting: Family Medicine

## 2020-10-19 ENCOUNTER — Telehealth: Payer: Self-pay

## 2020-10-19 MED ORDER — CITALOPRAM HYDROBROMIDE 20 MG PO TABS
20.0000 mg | ORAL_TABLET | Freq: Every day | ORAL | 2 refills | Status: DC
Start: 2020-10-19 — End: 2021-01-13

## 2020-10-19 NOTE — Telephone Encounter (Signed)
She may have Celexa refill with 2 additional refills She would need to do a follow-up visit somewhere within the next 90 days regarding citalopram.  This can be a virtual visit if she prefers

## 2020-10-19 NOTE — Telephone Encounter (Signed)
Pt requested RX refill on citalopram (CELEXA) 20 MG tablet  And medication was denied and pt is calling to check on this.  Pt call back [phone]

## 2020-10-22 ENCOUNTER — Ambulatory Visit (INDEPENDENT_AMBULATORY_CARE_PROVIDER_SITE_OTHER): Payer: 59 | Admitting: Internal Medicine

## 2020-10-22 ENCOUNTER — Other Ambulatory Visit: Payer: Self-pay

## 2020-10-22 ENCOUNTER — Encounter: Payer: Self-pay | Admitting: Internal Medicine

## 2020-10-22 VITALS — BP 104/64 | HR 69 | Temp 97.3°F | Ht 63.0 in | Wt 139.6 lb

## 2020-10-22 DIAGNOSIS — R112 Nausea with vomiting, unspecified: Secondary | ICD-10-CM | POA: Diagnosis not present

## 2020-10-22 DIAGNOSIS — R197 Diarrhea, unspecified: Secondary | ICD-10-CM | POA: Diagnosis not present

## 2020-10-22 DIAGNOSIS — K219 Gastro-esophageal reflux disease without esophagitis: Secondary | ICD-10-CM | POA: Diagnosis not present

## 2020-10-22 MED ORDER — COLESTIPOL HCL 1 G PO TABS: 2.0000 g | ORAL_TABLET | Freq: Two times a day (BID) | ORAL | 5 refills | Status: DC

## 2020-10-22 NOTE — Progress Notes (Signed)
Referring Provider: Babs Sciara, MD Primary Care Physician:  Babs Sciara, MD Primary GI:  Dr. Marletta Lor  Chief Complaint  Patient presents with  . Diarrhea    Has to eat bland foods because it upsets stomach, vomiting 1 week ago    HPI:   Stacey Compton is a 34 y.o. female who presents to clinic today for follow-up visit.  Previously seen for epigastric pain GERD, nausea, chronic diarrhea.  All her symptoms started after cholecystectomy January 2021.  She underwent EGD which showed gastritis and peptic duodenitis, negative for H. pylori.  Colonoscopy unremarkable with negative random colon biopsies.  I started her on colestipol for postcholecystectomy diarrhea she states this is helping some.  She is no longer having loose bowel movements every day.  She states she will go 2 to 3 days which are relatively normal and then she will have a washout day where she will have numerous loose bowel movements.  No melena hematochezia.  Also notes intermittent nausea as well.  Upper GI symptoms are improved on pantoprazole 40 mg daily.  Past Medical History:  Diagnosis Date  . Anemia   . Anxiety   . Depression    denies  . Endometriosis   . Hx of varicella   . IBS (irritable bowel syndrome)   . Infection    UTI  . Migraines    with aura  . PONV (postoperative nausea and vomiting)     Past Surgical History:  Procedure Laterality Date  . BIOPSY  09/11/2020   Procedure: BIOPSY;  Surgeon: Lanelle Bal, DO;  Location: [facility];  Service: Endoscopy;;  . CHOLECYSTECTOMY N/A 11/18/2019   Procedure: LAPAROSCOPIC CHOLECYSTECTOMY;  Surgeon: [name], MD;  Location: [facility];  Service: General;  Laterality: N/A;  . COLONOSCOPY WITH PROPOFOL N/A 09/11/2020   Procedure: COLONOSCOPY WITH PROPOFOL;  Surgeon: Lanelle Bal, DO;  Location: [facility];  Service: Endoscopy;  Laterality: N/A;  10:30am  . ESOPHAGOGASTRODUODENOSCOPY (EGD) WITH PROPOFOL N/A 09/11/2020   Procedure:  ESOPHAGOGASTRODUODENOSCOPY (EGD) WITH PROPOFOL;  Surgeon: Lanelle Bal, DO;  Location: [facility];  Service: Endoscopy;  Laterality: N/A;  . LAPAROSCOPY     with chromopertubation  . OTHER SURGICAL HISTORY     dx lap for endometriosis    Current Outpatient Medications  Medication Sig Dispense Refill  . citalopram (CELEXA) 20 MG tablet Take 1 tablet (20 mg total) by mouth at bedtime. 30 tablet 2  . clonazePAM (KLONOPIN) 0.5 MG tablet Take 1/2-1 tablet po BID prn anxiety (Patient taking differently: Take 0.25-0.5 mg by mouth as needed for anxiety.) 24 tablet 1  . diphenoxylate-atropine (LOMOTIL) 2.5-0.025 MG tablet Take 1 tablet by mouth 4 (four) times daily as needed for diarrhea or loose stools. 24 tablet 0  . Etonogestrel (NEXPLANON [place]) Inject 1 Device into the skin once.     . famotidine (PEPCID) 40 MG tablet Take one tablet po every morning (Patient taking differently: Take 40 mg by mouth as needed. Take one tablet po every morning) 30 tablet 5  . Fremanezumab-vfrm (AJOVY) 225 MG/1.5ML SOAJ Inject 225 mg into the skin every 30 (thirty) days. 1.68 mL 5  . ibuprofen (ADVIL) 200 MG tablet Take 200 mg by mouth as needed (pain.).    Marland Kitchen ondansetron (ZOFRAN) 8 MG tablet Take 8 mg by mouth as needed for nausea or vomiting (migraines.).    Marland Kitchen ORILISSA 150 MG TABS Take 150 mg by mouth at bedtime.    Marland Kitchen  pantoprazole (PROTONIX) 40 MG tablet Take 1 tablet (40 mg total) by mouth daily. 30 tablet 5  . Rimegepant Sulfate (NURTEC) 75 MG TBDP Take 75 mg by mouth daily as needed. Take at the onset of migraine.1 tablet/24 hours (Patient taking differently: Take 75 mg by mouth daily as needed (migraines Max 1 dose/24 hrs.).) 10 tablet 5  . colestipol (COLESTID) 1 g tablet Take 2 tablets (2 g total) by mouth 2 (two) times daily. 120 tablet 5   Current Facility-Administered Medications  Medication Dose Route Frequency Provider Last Rate Last Admin  . methylPREDNISolone acetate (DEPO-MEDROL) injection 40 mg   40 mg Intra-Lesional Once Merlyn Albert, MD        Allergies as of 10/22/2020 - Review Complete 10/22/2020  Allergen Reaction Noted  . Hydrocodone-homatropine Nausea And Vomiting and Rash 09/03/2015  . Percocet [oxycodone-acetaminophen] Hives and Rash 10/02/2013    Family History  Problem Relation Age of Onset  . Fibromyalgia Mother   . Diabetes Mother        borderline  . Depression Father   . Diabetes Father        type 2  . Colon polyps Father        >60  . Anxiety disorder Sister   . Crohn's disease Sister   . Bipolar disorder Maternal Aunt   . Cancer Maternal Grandmother        mouth and lung  . Bipolar disorder Maternal Grandfather   . Stroke Maternal Grandfather   . Heart disease Paternal Grandfather   . Colon cancer Cousin        maternal  . Celiac disease Neg Hx     Social History   Socioeconomic History  . Marital status: Married    Spouse name: Not on file  . Number of children: Not on file  . Years of education: Not on file  . Highest education level: Not on file  Occupational History  . Not on file  Tobacco Use  . Smoking status: Never Smoker  . Smokeless tobacco: Never Used  Vaping Use  . Vaping Use: Never used  Substance and Sexual Activity  . Alcohol use: No  . Drug use: No  . Sexual activity: Yes    Birth control/protection: None  Other Topics Concern  . Not on file  Social History Narrative  . Not on file   Social Determinants of Health   Financial Resource Strain: Not on file  Food Insecurity: Not on file  Transportation Needs: Not on file  Physical Activity: Not on file  Stress: Not on file  Social Connections: Not on file    Subjective: Review of Systems  Constitutional: Negative for chills and fever.  HENT: Negative for congestion and hearing loss.   Eyes: Negative for blurred vision and double vision.  Respiratory: Negative for cough and shortness of breath.   Cardiovascular: Negative for chest pain and palpitations.   Gastrointestinal: Positive for abdominal pain and diarrhea. Negative for blood in stool, constipation, heartburn, melena and vomiting.  Genitourinary: Negative for dysuria and urgency.  Musculoskeletal: Negative for joint pain and myalgias.  Skin: Negative for itching and rash.  Neurological: Negative for dizziness and headaches.  Psychiatric/Behavioral: Negative for depression. The patient is not nervous/anxious.      Objective: BP 104/64   Pulse 69   Temp (!) 97.3 F (36.3 C) (Temporal)   Ht 5\' 3"  (1.6 m)   Wt 139 lb 9.6 oz (63.3 kg)   BMI 24.73 kg/m  Physical Exam Constitutional:      Appearance: Normal appearance.  HENT:     Head: Normocephalic and atraumatic.  Eyes:     Extraocular Movements: Extraocular movements intact.     Conjunctiva/sclera: Conjunctivae normal.  Cardiovascular:     Rate and Rhythm: Normal rate and regular rhythm.  Pulmonary:     Effort: Pulmonary effort is normal.     Breath sounds: Normal breath sounds.  Abdominal:     General: Bowel sounds are normal.     Palpations: Abdomen is soft.  Musculoskeletal:        General: No swelling. Normal range of motion.     Cervical back: Normal range of motion and neck supple.  Skin:    General: Skin is warm and dry.     Coloration: Skin is not jaundiced.  Neurological:     General: No focal deficit present.     Mental Status: She is alert and oriented to person, place, and time.  Psychiatric:        Mood and Affect: Mood normal.        Behavior: Behavior normal.      Assessment: *GERD-improved on pantoprazole *Nausea-intermittent *Diarrhea-chronic: Slightly improved  Plan: Patient's upper GI symptoms including GERD is improved on pantoprazole.  We will continue for now.  Biopsies negative for H. pylori.  Avoid NSAIDs.  Diarrhea continues to be an ongoing issue for patient.  The colestipol is helping some.  We will increase to 2 g twice daily and monitor for improvement.  May consider course of  Xifaxan for potential small intestinal bacterial overgrowth if no improvement on increased dose of colestipol.  I have printed out a low FODMAP diet for patient to follow.  Follow-up in 6 to 8 weeks or sooner if needed.  10/22/2020 1:54 PM   Disclaimer: This note was dictated with voice recognition software. Similar sounding words can inadvertently be transcribed and may not be corrected upon review.

## 2020-10-22 NOTE — Patient Instructions (Signed)
I am going to increase your colestipol to 2 g twice daily.  Hopefully this helps with your chronic diarrhea.  I have also printed off information on a low FODMAP diet.    If no improvement in colestipol, we will consider trial of Xifaxan for small intestinal bacterial overgrowth.    Follow-up in 6 weeks or sooner if needed.  At [facility] we value your feedback. You may receive a survey about your visit today. Please share your experience as we strive to create trusting relationships with our patients to provide genuine, compassionate, quality care.  We appreciate your understanding and patience as we review any laboratory studies, imaging, and other diagnostic tests that are ordered as we care for you. Our office policy is 5 business days for review of these results, and any emergent or urgent results are addressed in a timely manner for your best interest. If you do not hear from our office in 1 week, please contact us.   We also encourage the use of MyChart, which contains your medical information for your review as well. If you are not enrolled in this feature, an access code is on this after visit summary for your convenience. Thank you for allowing [place] to be involved in your care.  It was great to see you today!  I hope you have a great rest of your winter!!    Hennie Duos. Marletta Lor, D.O. Gastroenterology and Hepatology [facility]

## 2020-11-03 ENCOUNTER — Other Ambulatory Visit: Payer: Self-pay | Admitting: *Deleted

## 2020-11-03 MED ORDER — NURTEC 75 MG PO TBDP: 75.0000 mg | ORAL_TABLET | Freq: Every day | ORAL | 5 refills | Status: DC | PRN

## 2020-11-18 ENCOUNTER — Other Ambulatory Visit: Payer: Self-pay

## 2020-11-18 ENCOUNTER — Telehealth: Payer: Self-pay | Admitting: Family Medicine

## 2020-11-18 MED ORDER — FLOVENT HFA 44 MCG/ACT IN AERO
INHALATION_SPRAY | RESPIRATORY_TRACT | 5 refills | Status: DC
Start: 2020-11-18 — End: 2021-01-28

## 2020-11-18 NOTE — Telephone Encounter (Signed)
Pt would like refill on Flovent HFA inhaler. Pt states she uses it very rarely. Please advise. Thank you.  [facility]

## 2020-11-18 NOTE — Telephone Encounter (Signed)
6 refills °

## 2020-11-18 NOTE — Telephone Encounter (Signed)
Refill sent to pharmacy and pt is aware. 

## 2020-12-03 ENCOUNTER — Ambulatory Visit: Payer: 59 | Admitting: Family Medicine

## 2020-12-03 ENCOUNTER — Other Ambulatory Visit: Payer: Self-pay

## 2020-12-03 VITALS — BP 112/72 | Temp 97.7°F | Ht 63.0 in | Wt 138.2 lb

## 2020-12-03 DIAGNOSIS — R197 Diarrhea, unspecified: Secondary | ICD-10-CM | POA: Diagnosis not present

## 2020-12-03 DIAGNOSIS — R5383 Other fatigue: Secondary | ICD-10-CM

## 2020-12-03 DIAGNOSIS — R55 Syncope and collapse: Secondary | ICD-10-CM | POA: Diagnosis not present

## 2020-12-03 DIAGNOSIS — R531 Weakness: Secondary | ICD-10-CM

## 2020-12-03 NOTE — Progress Notes (Signed)
Subjective:    Patient ID: Stacey Compton, female    DOB: Jul 03, 1986, 35 y.o.   MRN: [id number]  HPI Patient arrives to discuss fatigue and vertigo and dizzy spells. Patient currently seeing neurology for migraines and Gi for stomach issues. 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 Negative for activity change and appetite change.  HENT: Negative for congestion and rhinorrhea.   Respiratory: Negative for cough and shortness of breath.   Cardiovascular: Negative for chest pain and leg swelling.  Gastrointestinal: Negative for abdominal pain and diarrhea.  Endocrine: Negative for polydipsia and polyphagia.  Skin: Negative for color change.  Neurological: Positive for dizziness, weakness and light-headedness.  Psychiatric/Behavioral: Negative for behavioral problems and confusion.       Objective:   Physical Exam Vitals reviewed.  Constitutional:      General: She is not in acute distress.    Appearance: She is well-nourished.  HENT:     Head: Normocephalic and atraumatic.  Eyes:     General:        Right eye: No discharge.        Left eye: No discharge.  Neck:     Trachea: No tracheal deviation.  Cardiovascular:     Rate and Rhythm: Normal rate and regular rhythm.     Heart sounds: Normal heart sounds. No murmur  heard.   Pulmonary:     Effort: Pulmonary effort is normal. No respiratory distress.     Breath sounds: Normal breath sounds.  Musculoskeletal:        General: No edema.  Lymphadenopathy:     Cervical: No cervical adenopathy.  Skin:    General: Skin is warm and dry.  Neurological:     Mental Status: She is alert.     Coordination: Coordination normal.  Psychiatric:        Mood and Affect: Mood and affect normal.        Behavior: Behavior normal.     Orthostatics negative PHQ and GAD does not show significant depression    Assessment & Plan:  1. Near syncope Patient has spells where it overcomes her to the point where she literally collapses to the ground these do not occur suddenly such as a split 2nd but over several seconds making it less likely that it is an underlying heart issue Currently I do not feel the patient needs any telemetry - Iron Binding Cap (TIBC)([facility]) - Ferritin - CBC with Differential/Platelet - Comprehensive metabolic panel - TSH - T4, free - T3 - Cortisol - Vitamin B12 - Folate - Prealbumin - ANA  2. Other fatigue We will check thyroid function thoroughly as well as other parameters - Iron Binding Cap (TIBC)([facility]) - Ferritin - CBC with Differential/Platelet - Comprehensive metabolic panel - TSH - T4, free -  T3 - Cortisol - Vitamin B12 - Folate - Prealbumin - ANA - Amb ref to Medical Nutrition Therapy-MNT  3. Frequent diarrhea Being followed by gastroenterology very difficult for the patient to find a diet that helps with the diarrhea but also gives her proper nutrition currently she is consuming large amounts of carbohydrates and grains in very little protein or fats hopefully a consultation with dietary will help her find a good balance because she needs more protein within her diet - Iron Binding Cap (TIBC)([facility]) - Ferritin - CBC with Differential/Platelet - Comprehensive metabolic panel -  TSH - T4, free - T3 - Cortisol - Vitamin B12 - Folate - Prealbumin - ANA - Amb ref to Medical Nutrition Therapy-MNT  4. Weakness Significant weakness probably related to her underlying illness of the persistent diarrhea as well as difficult nutritional intake as described above  I doubt patient has adrenal insufficiency but we will check labs and await the results - Iron Binding Cap (TIBC)([facility]) - Ferritin - CBC with Differential/Platelet - Comprehensive metabolic panel - TSH - T4, free - T3 - Cortisol - Vitamin B12 - Folate - Prealbumin - ANA - Amb ref to Medical Nutrition Therapy-MNT   Chronic migraines followed by neurology

## 2020-12-04 ENCOUNTER — Encounter: Payer: Self-pay | Admitting: Family Medicine

## 2020-12-08 LAB — CBC WITH DIFFERENTIAL/PLATELET
Basophils Absolute: 0.1 10*3/uL (ref 0.0–0.2)
Basos: 1 %
EOS (ABSOLUTE): 0.3 10*3/uL (ref 0.0–0.4)
Eos: 4 %
Hematocrit: 42.2 % (ref 34.0–46.6)
Hemoglobin: 14.1 g/dL (ref 11.1–15.9)
Immature Grans (Abs): 0 10*3/uL (ref 0.0–0.1)
Immature Granulocytes: 0 %
Lymphocytes Absolute: 2.5 10*3/uL (ref 0.7–3.1)
Lymphs: 34 %
MCH: 31.6 pg (ref 26.6–33.0)
MCHC: 33.4 g/dL (ref 31.5–35.7)
MCV: 95 fL (ref 79–97)
Monocytes Absolute: 0.3 10*3/uL (ref 0.1–0.9)
Monocytes: 4 %
Neutrophils Absolute: 4.3 10*3/uL (ref 1.4–7.0)
Neutrophils: 57 %
Platelets: 285 10*3/uL (ref 150–450)
RBC: 4.46 x10E6/uL (ref 3.77–5.28)
RDW: 11.8 % (ref 11.7–15.4)
WBC: 7.4 10*3/uL (ref 3.4–10.8)

## 2020-12-08 LAB — COMPREHENSIVE METABOLIC PANEL
ALT: 11 IU/L (ref 0–32)
AST: 15 IU/L (ref 0–40)
Albumin/Globulin Ratio: 1.6 (ref 1.2–2.2)
Albumin: 4.7 g/dL (ref 3.8–4.8)
Alkaline Phosphatase: 51 IU/L (ref 44–121)
BUN/Creatinine Ratio: 12 (ref 9–23)
BUN: 8 mg/dL (ref 6–20)
Bilirubin Total: 0.5 mg/dL (ref 0.0–1.2)
CO2: 23 mmol/L (ref 20–29)
Calcium: 9.4 mg/dL (ref 8.7–10.2)
Chloride: 99 mmol/L (ref 96–106)
Creatinine, Ser: 0.67 mg/dL (ref 0.57–1.00)
GFR calc Af Amer: 131 mL/min/{1.73_m2} (ref >59)
GFR calc non Af Amer: 113 mL/min/{1.73_m2} (ref >59)
Globulin, Total: 2.9 g/dL (ref 1.5–4.5)
Glucose: 83 mg/dL (ref 65–99)
Potassium: 4.4 mmol/L (ref 3.5–5.2)
Sodium: 138 mmol/L (ref 134–144)
Total Protein: 7.6 g/dL (ref 6.0–8.5)

## 2020-12-08 LAB — FOLATE: Folate: 11.9 ng/mL (ref >3.0)

## 2020-12-08 LAB — ANA: ANA Titer 1: NEGATIVE

## 2020-12-08 LAB — PREALBUMIN: PREALBUMIN: 33 mg/dL (ref 14–35)

## 2020-12-08 LAB — IRON AND TIBC
Iron Saturation: 42 % (ref 15–55)
Iron: 134 ug/dL (ref 27–159)
Total Iron Binding Capacity: 322 ug/dL (ref 250–450)
UIBC: 188 ug/dL (ref 131–425)

## 2020-12-08 LAB — TSH: TSH: 1.6 u[IU]/mL (ref 0.450–4.500)

## 2020-12-08 LAB — T4, FREE: Free T4: 1.31 ng/dL (ref 0.82–1.77)

## 2020-12-08 LAB — T3: T3, Total: 111 ng/dL (ref 71–180)

## 2020-12-08 LAB — VITAMIN B12: Vitamin B-12: 349 pg/mL (ref 232–1245)

## 2020-12-08 LAB — CORTISOL: Cortisol: 10.3 ug/dL

## 2020-12-08 LAB — FERRITIN: Ferritin: 185 ng/mL — ABNORMAL HIGH (ref 15–150)

## 2020-12-10 ENCOUNTER — Ambulatory Visit (INDEPENDENT_AMBULATORY_CARE_PROVIDER_SITE_OTHER): Payer: 59 | Admitting: Internal Medicine

## 2020-12-10 ENCOUNTER — Encounter: Payer: Self-pay | Admitting: Internal Medicine

## 2020-12-10 ENCOUNTER — Other Ambulatory Visit: Payer: Self-pay

## 2020-12-10 VITALS — BP 106/58 | HR 77 | Temp 97.5°F | Ht 63.0 in | Wt 141.6 lb

## 2020-12-10 DIAGNOSIS — K219 Gastro-esophageal reflux disease without esophagitis: Secondary | ICD-10-CM

## 2020-12-10 DIAGNOSIS — R197 Diarrhea, unspecified: Secondary | ICD-10-CM

## 2020-12-10 MED ORDER — DICYCLOMINE HCL 10 MG PO CAPS: 10.0000 mg | ORAL_CAPSULE | Freq: Three times a day (TID) | ORAL | 5 refills | Status: DC

## 2020-12-10 NOTE — Progress Notes (Signed)
Referring Provider: Babs Sciara, MD Primary Care Physician:  Babs Sciara, MD Primary GI:  Dr. Marletta Lor  Chief Complaint  Patient presents with  . Diarrhea    twice a day  . Nausea    HPI:   Stacey Compton is a 35 y.o. female who presents to clinic today for follow-up visit. Previously seen for epigastric pain GERD, nausea, chronic diarrhea.  All her symptoms started after cholecystectomy January 2021.    She underwent EGD which showed gastritis and peptic duodenitis, negative for H. pylori.  Colonoscopy unremarkable with negative random colon biopsies.    I started her on colestipol for postcholecystectomy diarrhea which she states helped some.  I increase this to 2 g twice daily on previous visit.  She states her symptoms are mildly improved.  She is no longer having the washout.  Every few days.  She does continue to have 2 loose bowel movements daily.  No melena hematochezia.  She notes worsening fatigue and dizziness.  Also has chronic migraines.  Recently had blood work which was unremarkable besides mildly elevated ferritin.  Also notes intermittent nausea as well.  Upper GI symptoms are improved on pantoprazole 40 mg daily.  Past Medical History:  Diagnosis Date  . Anemia   . Anxiety   . Depression    denies  . Endometriosis   . Hx of varicella   . IBS (irritable bowel syndrome)   . Infection    UTI  . Migraines    with aura  . PONV (postoperative nausea and vomiting)     Past Surgical History:  Procedure Laterality Date  . BIOPSY  09/11/2020   Procedure: BIOPSY;  Surgeon: Lanelle Bal, DO;  Location: [facility];  Service: Endoscopy;;  . CHOLECYSTECTOMY N/A 11/18/2019   Procedure: LAPAROSCOPIC CHOLECYSTECTOMY;  Surgeon: [name], MD;  Location: [facility];  Service: General;  Laterality: N/A;  . COLONOSCOPY WITH PROPOFOL N/A 09/11/2020   Procedure: COLONOSCOPY WITH PROPOFOL;  Surgeon: Lanelle Bal, DO;  Location: [facility];  Service:  Endoscopy;  Laterality: N/A;  10:30am  . ESOPHAGOGASTRODUODENOSCOPY (EGD) WITH PROPOFOL N/A 09/11/2020   Procedure: ESOPHAGOGASTRODUODENOSCOPY (EGD) WITH PROPOFOL;  Surgeon: Lanelle Bal, DO;  Location: [facility];  Service: Endoscopy;  Laterality: N/A;  . LAPAROSCOPY     with chromopertubation  . OTHER SURGICAL HISTORY     dx lap for endometriosis    Current Outpatient Medications  Medication Sig Dispense Refill  . amoxicillin (AMOXIL) 875 MG tablet in the morning and at bedtime.    . citalopram (CELEXA) 20 MG tablet Take 1 tablet (20 mg total) by mouth at bedtime. 30 tablet 2  . clonazePAM (KLONOPIN) 0.5 MG tablet Take 1/2-1 tablet po BID prn anxiety (Patient taking differently: Take 0.25-0.5 mg by mouth as needed for anxiety.) 24 tablet 1  . colestipol (COLESTID) 1 g tablet Take 2 tablets (2 g total) by mouth 2 (two) times daily. 120 tablet 5  . diphenoxylate-atropine (LOMOTIL) 2.5-0.025 MG tablet Take 1 tablet by mouth 4 (four) times daily as needed for diarrhea or loose stools. 24 tablet 0  . Etonogestrel (NEXPLANON [place]) Inject 1 Device into the skin once.     . famotidine (PEPCID) 40 MG tablet Take one tablet po every morning (Patient taking differently: Take 40 mg by mouth as needed. Take one tablet po every morning) 30 tablet 5  . fluticasone (FLOVENT HFA) 44 MCG/ACT inhaler 2 puffs bid 1 each  5  . Fremanezumab-vfrm (AJOVY) 225 MG/1.5ML SOAJ Inject 225 mg into the skin every 30 (thirty) days. 1.68 mL 5  . ondansetron (ZOFRAN) 8 MG tablet Take 8 mg by mouth as needed for nausea or vomiting (migraines.).    Marland Kitchen ORILISSA 150 MG TABS Take 150 mg by mouth at bedtime.    . pantoprazole (PROTONIX) 40 MG tablet Take 1 tablet (40 mg total) by mouth daily. 30 tablet 5  . Rimegepant Sulfate (NURTEC) 75 MG TBDP Take 75 mg by mouth daily as needed. Take at the onset of migraine.1 tablet/24 hours 8 tablet 5   Current Facility-Administered Medications  Medication Dose Route Frequency Provider  Last Rate Last Admin  . methylPREDNISolone acetate (DEPO-MEDROL) injection 40 mg  40 mg Intra-Lesional Once Merlyn Albert, MD        Allergies as of 12/10/2020 - Review Complete 12/10/2020  Allergen Reaction Noted  . Hydrocodone-homatropine Nausea And Vomiting and Rash 09/03/2015  . Percocet [oxycodone-acetaminophen] Hives and Rash 10/02/2013    Family History  Problem Relation Age of Onset  . Fibromyalgia Mother   . Diabetes Mother        borderline  . Depression Father   . Diabetes Father        type 2  . Colon polyps Father        >60  . Anxiety disorder Sister   . Crohn's disease Sister   . Bipolar disorder Maternal Aunt   . Cancer Maternal Grandmother        mouth and lung  . Bipolar disorder Maternal Grandfather   . Stroke Maternal Grandfather   . Heart disease Paternal Grandfather   . Colon cancer Cousin        maternal  . Celiac disease Neg Hx     Social History   Socioeconomic History  . Marital status: Married    Spouse name: Not on file  . Number of children: Not on file  . Years of education: Not on file  . Highest education level: Not on file  Occupational History  . Not on file  Tobacco Use  . Smoking status: Never Smoker  . Smokeless tobacco: Never Used  Vaping Use  . Vaping Use: Never used  Substance and Sexual Activity  . Alcohol use: No  . Drug use: No  . Sexual activity: Yes    Birth control/protection: None  Other Topics Concern  . Not on file  Social History Narrative  . Not on file   Social Determinants of Health   Financial Resource Strain: Not on file  Food Insecurity: Not on file  Transportation Needs: Not on file  Physical Activity: Not on file  Stress: Not on file  Social Connections: Not on file    Subjective: Review of Systems  Constitutional: Negative for chills and fever.  HENT: Negative for congestion and hearing loss.   Eyes: Negative for blurred vision and double vision.  Respiratory: Negative for cough and  shortness of breath.   Cardiovascular: Negative for chest pain and palpitations.  Gastrointestinal: Positive for diarrhea. Negative for abdominal pain, blood in stool, constipation, heartburn, melena and vomiting.  Genitourinary: Negative for dysuria and urgency.  Musculoskeletal: Negative for joint pain and myalgias.  Skin: Negative for itching and rash.  Neurological: Negative for dizziness and headaches.  Psychiatric/Behavioral: Negative for depression. The patient is not nervous/anxious.      Objective: BP (!) 106/58   Pulse 77   Temp (!) 97.5 F (36.4 C)  Ht 5\' 3"  (1.6 m)   Wt 141 lb 9.6 oz (64.2 kg)   BMI 25.08 kg/m  Physical Exam Constitutional:      Appearance: Normal appearance.  HENT:     Head: Normocephalic and atraumatic.  Eyes:     Extraocular Movements: Extraocular movements intact.     Conjunctiva/sclera: Conjunctivae normal.  Cardiovascular:     Rate and Rhythm: Normal rate and regular rhythm.  Pulmonary:     Effort: Pulmonary effort is normal.     Breath sounds: Normal breath sounds.  Abdominal:     General: Bowel sounds are normal.     Palpations: Abdomen is soft.  Musculoskeletal:        General: No swelling. Normal range of motion.     Cervical back: Normal range of motion and neck supple.  Skin:    General: Skin is warm and dry.     Coloration: Skin is not jaundiced.  Neurological:     General: No focal deficit present.     Mental Status: She is alert and oriented to person, place, and time.  Psychiatric:        Mood and Affect: Mood normal.        Behavior: Behavior normal.      Assessment: *GERD-improved on pantoprazole *Nausea-intermittent *Diarrhea-chronic: Slightly improved  Plan: Patient's upper GI symptoms including GERD is improved on pantoprazole.  We will continue for now.  Biopsies negative for H. pylori.  Avoid NSAIDs.  Diarrhea continues to be an ongoing issue for patient.  The colestipol is helping some.  However given  her worsening fatigue and dizziness, I am concerned that she is not tolerating this medication.  Discussed that this could be side effects from colestipol.  As such we will discontinue now.  I am going to start her on dicyclomine 10 mg 4 times daily as needed.  Patient has been following low FODMAP diet which seems to be helping as well.  She is actually meeting with a dietitian soon.  If dicyclomine does not work, we can consider breath testing for small intestinal bacterial overgrowth syndrome.  Patient asking about her mildly elevated ferritin.  This is an acute phase reactant.  I discussed that she is only mildly elevated we can continue to monitor for now.  Her saturation is normal which is reassuring in regards to potential underlying hemochromatosis  Follow-up in  8 to 12 weeks or sooner if needed.   12/10/2020 9:11 AM   Disclaimer: This note was dictated with voice recognition software. Similar sounding words can inadvertently be transcribed and may not be corrected upon review.

## 2020-12-10 NOTE — Patient Instructions (Signed)
I want you to stop taking the colestipol as this can cause fatigue and dizziness.  I sent in a prescription for dicyclomine to take up to 4 times daily as needed.  We will consider testing you for small intestinal bacterial overgrowth syndrome if not improved on dicyclomine.  Continue to follow a low FODMAP diet as you can.  Follow-up in 8 weeks or sooner if needed.  At [facility] we value your feedback. You may receive a survey about your visit today. Please share your experience as we strive to create trusting relationships with our patients to provide genuine, compassionate, quality care.  We appreciate your understanding and patience as we review any laboratory studies, imaging, and other diagnostic tests that are ordered as we care for you. Our office policy is 5 business days for review of these results, and any emergent or urgent results are addressed in a timely manner for your best interest. If you do not hear from our office in 1 week, please contact us.   We also encourage the use of MyChart, which contains your medical information for your review as well. If you are not enrolled in this feature, an access code is on this after visit summary for your convenience. Thank you for allowing [place] to be involved in your care.  It was great to see you today!  I hope you have a great rest of your winter!!    Hennie Duos. Marletta Lor, D.O. Gastroenterology and Hepatology [facility]

## 2020-12-15 ENCOUNTER — Ambulatory Visit (INDEPENDENT_AMBULATORY_CARE_PROVIDER_SITE_OTHER): Payer: 59 | Admitting: Family Medicine

## 2020-12-15 ENCOUNTER — Encounter: Payer: Self-pay | Admitting: Family Medicine

## 2020-12-15 ENCOUNTER — Other Ambulatory Visit: Payer: Self-pay

## 2020-12-15 VITALS — BP 110/70 | HR 82 | Temp 95.0°F | Wt 139.0 lb

## 2020-12-15 DIAGNOSIS — R5383 Other fatigue: Secondary | ICD-10-CM | POA: Diagnosis not present

## 2020-12-15 DIAGNOSIS — R531 Weakness: Secondary | ICD-10-CM

## 2020-12-15 DIAGNOSIS — R55 Syncope and collapse: Secondary | ICD-10-CM | POA: Diagnosis not present

## 2020-12-15 DIAGNOSIS — R197 Diarrhea, unspecified: Secondary | ICD-10-CM

## 2020-12-15 NOTE — Progress Notes (Signed)
   Subjective:    Patient ID: Stacey Compton, female    DOB: Dec 16, 1985, 35 y.o.   MRN: [id number]  HPI Pt having fatigue, shakiness and dizziness. Heaviness in head and hands. 2 days last week doing ok. Today is the first day she has been out of bed. Last night, pt passed out, doesn't recall anything. Walking really slow and careful. Pt will get herself down if she feel dizziness coming on. Pt has had some nausea.   Patient's been having very severe issues over the past several weeks since her last visit on the 27th she has ongoing fatigue tiredness a very heavy feeling to her head arms and legs often finds her self having to lay down because she feels so bad denies any chest pains or palpitations does states she gets dizzy upon standing and she had an episode yesterday where she was standing got dizzy and passed out no seizure activity.  Patient tries to keep well-hydrated and is doing a better job eating  Syncope, unspecified syncope type  Other fatigue  Frequent diarrhea  Weakness  Patient still has intermittent diarrhea but not as bad as it was being worked with gastroenterology Review of Systems See above denies any chest pain fever chills sweats wheezing difficulty breathing finds her self feeling down about things but feels that is coming because she feels so bad not because depression is causing this.    Objective:   Physical Exam Lungs are clear no crackles respiratory rate normal heart regular pulse normal skin warm dry Blood pressure sitting 110/70 standing 92/70  When electronic cuff was used with checking heart rate and blood pressure 30 to 60 seconds after standing there was no significant drop       Assessment & Plan:  1. Syncope, unspecified syncope type Very complex situation.  Over the past several months with the fatigue tiredness feeling bad diarrhea not eating as well I believe this is contributed to her weakness but with her passing out with the spells and  having times where she gets very lightheaded with standing this needs further looking into.  Will have communication with cardiology to see if they recommend additional testing  2. Other fatigue Need to make sure that patient does not have adrenal insufficiency going on will communicate with endocrinology to see if they feel further evaluation and testing would be warranted  3. Frequent diarrhea Followed by GI no additional intervention by [place] currently I have encouraged her to follow-up with GI they made mention of the possibility of antibiotic for bacterial overgrowth  4. Weakness I have encouraged patient to start taking it more protein into her diet she will work hard illness  We will do a phone follow-up again in a week

## 2020-12-16 ENCOUNTER — Encounter: Payer: Self-pay | Admitting: Family Medicine

## 2020-12-16 ENCOUNTER — Encounter: Payer: Self-pay | Admitting: Adult Health

## 2020-12-16 DIAGNOSIS — R5383 Other fatigue: Secondary | ICD-10-CM

## 2020-12-16 DIAGNOSIS — R42 Dizziness and giddiness: Secondary | ICD-10-CM

## 2020-12-16 DIAGNOSIS — R55 Syncope and collapse: Secondary | ICD-10-CM

## 2020-12-16 NOTE — Telephone Encounter (Signed)
I do not mind seeing her sooner to address her migraines.  As far as the other symptoms dizziness, fatigue and ringing in the ears these are all new things.  If she needs additional work-up for these things her PCP should send over a referral

## 2020-12-17 NOTE — Telephone Encounter (Signed)
Nurses We will need to put in 2 separate referrals, after completing please forward this message to Jakiyah to keep her in the loop of what is going on  #1 cardiology-recent syncope and lightheaded with standing -more than likely will need further work-up including consultation, telemetry, echo to evaluate syncope plus also rule out POTS  #2 neurology-dizziness and syncope (along with fatigue)-patient has previous visits with [name] for migraines but because these are in the eyes of neurology a new area they are requesting a new referral.  Hopefully because she is already an established patient they would be willing to see her relatively soon  She will also need lab work to specifically look at vitamin D and methylmalonic acid level due to fatigue tiredness and low normal vitamin B12  Hi Wynette  I did communicate with cardiology.  It would be reasonable for them to see you to help rule out any other contributing factor to the syncope and lightheadedness with standing.  I do not feel that underlying cardiology issues are the source of your issue but we will help make sure that there is not additional issues that are contributing to your lightheadedness and the passing out that he had.  We will go ahead and send over a new neurology consultation.  Apparently this is necessary for them to evaluate these symptoms.  I did communicate with endocrinology and they reviewed over your record and do not feel that you have adrenal insufficiency.  The additional blood work is to look for nutritional deficiencies since you have had such a difficult time with your nutrition over the past few months.  As you can see we are looking into multiple different avenues to try to help your underlying symptoms.  Certainly improving your nutrition would help.  Please work hard with gastroenterology and nutrition consult when it occurs.  We will do a phone follow-up next week and more than likely do a  office follow-up in 4 to 6 weeks.  Thanks-Dr. Lorin Picket

## 2020-12-17 NOTE — Addendum Note (Signed)
Addended by: Marlowe Shores on: 12/17/2020 09:04 AM   Modules accepted: Orders

## 2020-12-18 ENCOUNTER — Telehealth: Payer: Self-pay

## 2020-12-18 ENCOUNTER — Other Ambulatory Visit: Payer: Self-pay | Admitting: Family Medicine

## 2020-12-18 NOTE — Telephone Encounter (Signed)
Pt contacted. Pt states last night through this morning she has been feeling bad and having a migraine. Informed pt that she could go when she is feeling better

## 2020-12-18 NOTE — Telephone Encounter (Signed)
Patient having Nausea and vomiting and diarrhea today.  Doesn't feel well enough to go have her labs done today.  She is going to try to go Monday.  Is this ok?

## 2020-12-22 ENCOUNTER — Telehealth (INDEPENDENT_AMBULATORY_CARE_PROVIDER_SITE_OTHER): Payer: 59 | Admitting: Family Medicine

## 2020-12-22 ENCOUNTER — Other Ambulatory Visit: Payer: Self-pay

## 2020-12-22 DIAGNOSIS — R5383 Other fatigue: Secondary | ICD-10-CM | POA: Diagnosis not present

## 2020-12-22 DIAGNOSIS — R42 Dizziness and giddiness: Secondary | ICD-10-CM | POA: Diagnosis not present

## 2020-12-22 NOTE — Progress Notes (Signed)
   Subjective:    Patient ID: Stacey Compton, female    DOB: 1986/10/24, 35 y.o.   MRN: [id number]  HPI  Patient calls to follow up on dizziness. Patient states she is not doing better- some days are the same and some days are worse. Discussion follow-up regarding how patient is doing.  Still having a lot of fatigue tiredness dizziness nausea diarrhea.  Finds her self feeling woozy a lot.  No more passing out spells.  Relates frequent migraines as well.  Uncertain if any of this could be medications interacting Review of Systems Virtual Visit via Telephone Note  I connected with Vyolet R Jump on 12/22/20 at 11:40 AM EST by telephone and verified that I am speaking with the correct person using two identifiers.  Location: Patient: home Provider: office   I discussed the limitations, risks, security and privacy concerns of performing an evaluation and management service by telephone and the availability of in person appointments. I also discussed with the patient that there may be a patient responsible charge related to this service. The patient expressed understanding and agreed to proceed.   History of Present Illness:    Observations/Objective:   Assessment and Plan:   Follow Up Instructions:    I discussed the assessment and treatment plan with the patient. The patient was provided an opportunity to ask questions and all were answered. The patient agreed with the plan and demonstrated an understanding of the instructions.   The patient was advised to call back or seek an in-person evaluation if the symptoms worsen or if the condition fails to improve as anticipated.  I provided 10 minutes of non-face-to-face time during this encounter.       Objective:   Physical Exam  Today's visit was via telephone Physical exam was not possible for this visit       Assessment & Plan:  Plan discussion regarding her fatigue weakness No more passing out spells Patient will  get her lab work done hopefully this week She is still having severe diarrhea she will follow-up with Dr. Marletta Lor in March if not doing better possibly small bowel breath test for small bowel bacterial overgrowth She is seeing neurology this week she will discuss the dizziness fatigue and passing out with them to see if they have any input regarding these issues.  Also cardiology will be working on setting patient up

## 2020-12-24 ENCOUNTER — Ambulatory Visit (INDEPENDENT_AMBULATORY_CARE_PROVIDER_SITE_OTHER): Payer: 59 | Admitting: Adult Health

## 2020-12-24 ENCOUNTER — Encounter: Payer: Self-pay | Admitting: Adult Health

## 2020-12-24 VITALS — BP 113/80 | HR 83 | Ht 63.0 in | Wt 136.0 lb

## 2020-12-24 DIAGNOSIS — G43109 Migraine with aura, not intractable, without status migrainosus: Secondary | ICD-10-CM | POA: Diagnosis not present

## 2020-12-24 DIAGNOSIS — R42 Dizziness and giddiness: Secondary | ICD-10-CM

## 2020-12-24 NOTE — Patient Instructions (Signed)
Your Plan:  Continue ajovy and nurtec for migraines Refer to neuro rehab to eval for vertigo Can consider repeating MRI in the future If your symptoms worsen or you develop new symptoms please let us know.   Thank you for coming to see [place] at [facility]. I hope we have been able to provide you high quality care today.  You may receive a patient satisfaction survey over the next few weeks. We would appreciate your feedback and comments so that we may continue to improve ourselves and the health of our patients.

## 2020-12-24 NOTE — Progress Notes (Signed)
PATIENT: Stacey Compton DOB: 1986/07/25  REASON FOR VISIT: follow up HISTORY FROM: patient  HISTORY OF PRESENT ILLNESS: Today 12/24/20:  Stacey Compton is a 35 year old female with a history of migraine headaches.  She returns today for follow-up.  The patient states that she has had dizziness has been persistent for the last 3 weeks.  She has a ongoing history of dizziness that occurs intermittently.  She states that some of her episodes has a true room spinning sensation while other times she feels like she is floating and off balance.  She states that when she lays down her symptoms improve but do not resolve.  She states the longer that she sits up the symptoms worsen.  She states that with severe episodes her head and hands feel heavy.  She is also been evaluated by her PCP who has completed blood work that has been relatively unremarkable.  Patient is awaiting a referral to cardiology.  She reports that her headache frequency has improved with Ajovy.  Although in the last 3 weeks the headaches seem to be slightly increased but she relates this to the multitude of other symptoms she is having at this time.  HISTORY 02/18/20:  Stacey Compton is a 35 year old female with a history of migraine headaches.  She returns today for follow-up.  She continues on Aimovig and Nurtec.  She reports that she has approximately 2 headaches a month but they may last 2 to 3 days.  She states that Nurtec does give her some benefit but does not resolve the headache.  She reports she has noticed recently that she will get changes in her peripheral vision bilaterally prior to her headache.  She describes it as a bug running across the room.  She returns today for an evaluation  REVIEW OF SYSTEMS: Out of a complete 14 system review of symptoms, the patient complains only of the following symptoms, and all other reviewed systems are negative.  See HPI  ALLERGIES: Allergies  Allergen Reactions  .  Hydrocodone-Homatropine Nausea And Vomiting and Rash    Hycodan   . Percocet [Oxycodone-Acetaminophen] Hives and Rash    HOME MEDICATIONS: Outpatient Medications Prior to Visit  Medication Sig Dispense Refill  . citalopram (CELEXA) 20 MG tablet Take 1 tablet (20 mg total) by mouth at bedtime. 30 tablet 2  . clonazePAM (KLONOPIN) 0.5 MG tablet TAKE 1/2 TO 1 (ONE-HALF TO ONE) TABLET BY MOUTH TWICE DAILY AS NEEDED FOR ANXIETY 24 tablet 1  . dicyclomine (BENTYL) 10 MG capsule Take 1 capsule (10 mg total) by mouth 4 (four) times daily -  before meals and at bedtime. 120 capsule 5  . diphenoxylate-atropine (LOMOTIL) 2.5-0.025 MG tablet Take 1 tablet by mouth 4 (four) times daily as needed for diarrhea or loose stools. 24 tablet 0  . Etonogestrel (NEXPLANON [place]) Inject 1 Device into the skin once.     . famotidine (PEPCID) 40 MG tablet Take one tablet po every morning (Patient taking differently: Take 40 mg by mouth as needed. Take one tablet po every morning) 30 tablet 5  . fluticasone (FLOVENT HFA) 44 MCG/ACT inhaler 2 puffs bid 1 each 5  . Fremanezumab-vfrm (AJOVY) 225 MG/1.5ML SOAJ Inject 225 mg into the skin every 30 (thirty) days. 1.68 mL 5  . ondansetron (ZOFRAN) 8 MG tablet Take 8 mg by mouth as needed for nausea or vomiting (migraines.).    Marland Kitchen ORILISSA 150 MG TABS Take 150 mg by mouth at bedtime.    Marland Kitchen  pantoprazole (PROTONIX) 40 MG tablet Take 1 tablet (40 mg total) by mouth daily. 30 tablet 5  . Rimegepant Sulfate (NURTEC) 75 MG TBDP Take 75 mg by mouth daily as needed. Take at the onset of migraine.1 tablet/24 hours 8 tablet 5  . amoxicillin (AMOXIL) 875 MG tablet in the morning and at bedtime. (Patient not taking: Reported on 12/22/2020)     Facility-Administered Medications Prior to Visit  Medication Dose Route Frequency Provider Last Rate Last Admin  . methylPREDNISolone acetate (DEPO-MEDROL) injection 40 mg  40 mg Intra-Lesional Once Merlyn Albert, MD        PAST MEDICAL  HISTORY: Past Medical History:  Diagnosis Date  . Anemia   . Anxiety   . Depression    denies  . Endometriosis   . Hx of varicella   . IBS (irritable bowel syndrome)   . Infection    UTI  . Migraines    with aura  . PONV (postoperative nausea and vomiting)     PAST SURGICAL HISTORY: Past Surgical History:  Procedure Laterality Date  . BIOPSY  09/11/2020   Procedure: BIOPSY;  Surgeon: Lanelle Bal, DO;  [facility];  Service: Endoscopy;;  . CHOLECYSTECTOMY N/A 11/18/2019   Procedure: LAPAROSCOPIC CHOLECYSTECTOMY;  Surgeon: [name], MD;  Location: [facility];  Service: General;  Laterality: N/A;  . COLONOSCOPY WITH PROPOFOL N/A 09/11/2020   Procedure: COLONOSCOPY WITH PROPOFOL;  Surgeon: Lanelle Bal, DO;  [facility];  Service: Endoscopy;  Laterality: N/A;  10:30am  . ESOPHAGOGASTRODUODENOSCOPY (EGD) WITH PROPOFOL N/A 09/11/2020   Procedure: ESOPHAGOGASTRODUODENOSCOPY (EGD) WITH PROPOFOL;  Surgeon: Lanelle Bal, DO;  [facility];  Service: Endoscopy;  Laterality: N/A;  . LAPAROSCOPY     with chromopertubation  . OTHER SURGICAL HISTORY     dx lap for endometriosis    FAMILY HISTORY: Family History  Problem Relation Age of Onset  . Fibromyalgia Mother   . Diabetes Mother        borderline  . Depression Father   . Diabetes Father        type 2  . Colon polyps Father        >60  . Anxiety disorder Sister   . Crohn's disease Sister   . Bipolar disorder Maternal Aunt   . Cancer Maternal Grandmother        mouth and lung  . Bipolar disorder Maternal Grandfather   . Stroke Maternal Grandfather   . Heart disease Paternal Grandfather   . Colon cancer Cousin        maternal  . Celiac disease Neg Hx     SOCIAL HISTORY: Social History   Socioeconomic History  . Marital status: Married    Spouse name: Not on file  . Number of children: Not on file  . Years of education: Not on file  . Highest education level: Not  on file  Occupational History  . Not on file  Tobacco Use  . Smoking status: Never Smoker  . Smokeless tobacco: Never Used  Vaping Use  . Vaping Use: Never used  Substance and Sexual Activity  . Alcohol use: No  . Drug use: No  . Sexual activity: Yes    Birth control/protection: None  Other Topics Concern  . Not on file  Social History Narrative  . Not on file   Social Determinants of Health   Financial Resource Strain: Not on file  Food Insecurity: Not on file  Transportation Needs: Not on file  Physical Activity: Not on file  Stress: Not on file  Social Connections: Not on file  Intimate Partner Violence: Not on file      PHYSICAL EXAM  Vitals:   12/24/20 1336  Weight: 136 lb (61.7 kg)  Height: 5\' 3"  (1.6 m)   Body mass index is 24.09 kg/m.  Generalized: Well developed, in no acute distress   Neurological examination  Mentation: Alert oriented to time, place, history taking. Follows all commands speech and language fluent Cranial nerve II-XII: Pupils were equal round reactive to light. Extraocular movements were full, visual field were full on confrontational test.  End beat nystagmus noted with horizontal and vertical gaze.  Head turning and shoulder shrug  were normal and symmetric.  Epley maneuver completed during today's visit resulted in increased dizziness Motor: The motor testing reveals 5 over 5 strength of all 4 extremities. Good symmetric motor tone is noted throughout.  Sensory: Sensory testing is intact to soft touch on all 4 extremities. No evidence of extinction is noted.  Coordination: Cerebellar testing reveals good finger-nose-finger and heel-to-shin bilaterally.  Gait and station: Gait is slow and cautious Reflexes: Deep tendon reflexes are symmetric and normal bilaterally.   DIAGNOSTIC DATA (LABS, IMAGING, TESTING) - I reviewed patient records, labs, notes, testing and imaging myself where available.  Lab Results  Component Value Date    WBC 7.4 12/04/2020   HGB 14.1 12/04/2020   HCT 42.2 12/04/2020   MCV 95 12/04/2020   PLT 285 12/04/2020      Component Value Date/Time   NA 138 12/04/2020 1138   K 4.4 12/04/2020 1138   CL 99 12/04/2020 1138   CO2 23 12/04/2020 1138   GLUCOSE 83 12/04/2020 1138   GLUCOSE 105 (H) 10/23/2019 1730   BUN 8 12/04/2020 1138   CREATININE 0.67 12/04/2020 1138   CALCIUM 9.4 12/04/2020 1138   PROT 7.6 12/04/2020 1138   ALBUMIN 4.7 12/04/2020 1138   AST 15 12/04/2020 1138   ALT 11 12/04/2020 1138   ALKPHOS 51 12/04/2020 1138   BILITOT 0.5 12/04/2020 1138   GFRNONAA 113 12/04/2020 1138   GFRAA 131 12/04/2020 1138    Lab Results  Component Value Date   TSH 1.600 12/04/2020      ASSESSMENT AND PLAN 35 y.o. year old female  has a past medical history of Anemia, Anxiety, Depression, Endometriosis, varicella, IBS (irritable bowel syndrome), Infection, Migraines, and PONV (postoperative nausea and vomiting). here with:  1.  Migraine headaches   -Continue Ajovy for headache prevention  -Continue Nurtec or Imitrex as abortive therapy -In the future may switch to Botox -Advised if her headache frequency or severity worsen she should let 31 know  2.  Dizziness  --Etiology is unclear-- Epley maneuver today in office resulted in increased dizziness.  Dizziness does seem to have a vertigo component.  I will place a referral to vestibular rehab for evaluation.  We discussed repeating MRI of the brain however the patient deferred as she was having dizziness when she had the last scan in 2020. --According to the patient PCP has placed a referral for cardiac evaluation  -Follow-up in 6 months or sooner if needed   I spent 45 minutes of face-to-face and non-face-to-face time with patient.  This included previsit chart review, lab review, study review, order entry, electronic health record documentation, patient education.  2021, MSN, NP-C 12/24/2020, 1:37 PM [facility] [address] [place], [place]  [address] [phone]   

## 2020-12-30 ENCOUNTER — Encounter: Payer: Self-pay | Admitting: Cardiology

## 2020-12-30 ENCOUNTER — Encounter: Payer: Self-pay | Admitting: Family Medicine

## 2020-12-30 ENCOUNTER — Ambulatory Visit (INDEPENDENT_AMBULATORY_CARE_PROVIDER_SITE_OTHER): Payer: 59 | Admitting: Cardiology

## 2020-12-30 VITALS — BP 112/79 | HR 111 | Ht 63.0 in | Wt 135.8 lb

## 2020-12-30 DIAGNOSIS — R42 Dizziness and giddiness: Secondary | ICD-10-CM

## 2020-12-30 MED ORDER — FLUDROCORTISONE ACETATE 0.1 MG PO TABS: 0.1000 mg | ORAL_TABLET | Freq: Every day | ORAL | 1 refills | Status: DC

## 2020-12-30 NOTE — Progress Notes (Signed)
Clinical Summary Ms. Gorman is a 35 y.o.female seen as a new patient for the following medical problems.   1. Dizziness - ongoing dizziness, fatigue, syncope  - dizziness of and on for 1 year, recently worsening around Christmas time - over the last 3 weeks has constant dizziness. Worst with standing. Feeling of lightheadedness, can have some feeling of room spinning. The spinning portion can come on with movement - can have some orthostatic dizzienss with standing - no palpitations  - 40 oz of water x 2-3, coffee in AM, gatorade x2-3 smaller bottles - diarrhea daily, frequent loose watery stools.    - seen by neuro, notes mention epley maneuver did cause some dizziness. HR 84-->111  - orthostatics: DBP down 11 points with standing,    Past Medical History:  Diagnosis Date  . Anemia   . Anxiety   . Depression    denies  . Endometriosis   . Hx of varicella   . IBS (irritable bowel syndrome)   . Infection    UTI  . Migraines    with aura  . PONV (postoperative nausea and vomiting)      Allergies  Allergen Reactions  . Hydrocodone-Homatropine Nausea And Vomiting and Rash    Hycodan   . Percocet [Oxycodone-Acetaminophen] Hives and Rash     Current Outpatient Medications  Medication Sig Dispense Refill  . citalopram (CELEXA) 20 MG tablet Take 1 tablet (20 mg total) by mouth at bedtime. 30 tablet 2  . clonazePAM (KLONOPIN) 0.5 MG tablet TAKE 1/2 TO 1 (ONE-HALF TO ONE) TABLET BY MOUTH TWICE DAILY AS NEEDED FOR ANXIETY 24 tablet 1  . dicyclomine (BENTYL) 10 MG capsule Take 1 capsule (10 mg total) by mouth 4 (four) times daily -  before meals and at bedtime. 120 capsule 5  . Etonogestrel (NEXPLANON [place]) Inject 1 Device into the skin once.     . famotidine (PEPCID) 40 MG tablet Take one tablet po every morning (Patient taking differently: Take 40 mg by mouth as needed. Take one tablet po every morning) 30 tablet 5  . fluticasone (FLOVENT HFA) 44 MCG/ACT inhaler 2  puffs bid 1 each 5  . Fremanezumab-vfrm (AJOVY) 225 MG/1.5ML SOAJ Inject 225 mg into the skin every 30 (thirty) days. 1.68 mL 5  . ondansetron (ZOFRAN) 8 MG tablet Take 8 mg by mouth as needed for nausea or vomiting (migraines.).    Marland Kitchen ORILISSA 150 MG TABS Take 150 mg by mouth at bedtime.    . pantoprazole (PROTONIX) 40 MG tablet Take 1 tablet (40 mg total) by mouth daily. 30 tablet 5  . Rimegepant Sulfate (NURTEC) 75 MG TBDP Take 75 mg by mouth daily as needed. Take at the onset of migraine.1 tablet/24 hours 8 tablet 5   Current Facility-Administered Medications  Medication Dose Route Frequency Provider Last Rate Last Admin  . methylPREDNISolone acetate (DEPO-MEDROL) injection 40 mg  40 mg Intra-Lesional Once Merlyn Albert, MD         Past Surgical History:  Procedure Laterality Date  . BIOPSY  09/11/2020   Procedure: BIOPSY;  Surgeon: Lanelle Bal, DO;  [facility];  Service: Endoscopy;;  . CHOLECYSTECTOMY N/A 11/18/2019   Procedure: LAPAROSCOPIC CHOLECYSTECTOMY;  Surgeon: [name], MD;  Location: [facility];  Service: General;  Laterality: N/A;  . COLONOSCOPY WITH PROPOFOL N/A 09/11/2020   Procedure: COLONOSCOPY WITH PROPOFOL;  Surgeon: Lanelle Bal, DO;  [facility];  Service: Endoscopy;  Laterality: N/A;  10:30am  . ESOPHAGOGASTRODUODENOSCOPY (EGD) WITH PROPOFOL N/A 09/11/2020   Procedure: ESOPHAGOGASTRODUODENOSCOPY (EGD) WITH PROPOFOL;  Surgeon: Lanelle Bal, DO;  [facility];  Service: Endoscopy;  Laterality: N/A;  . LAPAROSCOPY     with chromopertubation  . OTHER SURGICAL HISTORY     dx lap for endometriosis     Allergies  Allergen Reactions  . Hydrocodone-Homatropine Nausea And Vomiting and Rash    Hycodan   . Percocet [Oxycodone-Acetaminophen] Hives and Rash      Family History  Problem Relation Age of Onset  . Fibromyalgia Mother   . Diabetes Mother        borderline  . Depression Father   . Diabetes  Father        type 2  . Colon polyps Father        >60  . Anxiety disorder Sister   . Crohn's disease Sister   . Bipolar disorder Maternal Aunt   . Cancer Maternal Grandmother        mouth and lung  . Bipolar disorder Maternal Grandfather   . Stroke Maternal Grandfather   . Heart disease Paternal Grandfather   . Colon cancer Cousin        maternal  . Celiac disease Neg Hx      Social History Ms. Shasteen reports that she has never smoked. She has never used smokeless tobacco. Ms. Probus reports no history of alcohol use.   Review of Systems CONSTITUTIONAL: No weight loss, fever, chills, weakness or fatigue.  HEENT: Eyes: No visual loss, blurred vision, double vision or yellow sclerae.No hearing loss, sneezing, congestion, runny nose or sore throat.  SKIN: No rash or itching.  CARDIOVASCULAR: per hpi RESPIRATORY: No shortness of breath, cough or sputum.  GASTROINTESTINAL: No anorexia, nausea, vomiting or diarrhea. No abdominal pain or blood.  GENITOURINARY: No burning on urination, no polyuria NEUROLOGICAL: No headache, dizziness, syncope, paralysis, ataxia, numbness or tingling in the extremities. No change in bowel or bladder control.  MUSCULOSKELETAL: No muscle, back pain, joint pain or stiffness.  LYMPHATICS: No enlarged nodes. No history of splenectomy.  PSYCHIATRIC: No history of depression or anxiety.  ENDOCRINOLOGIC: No reports of sweating, cold or heat intolerance. No polyuria or polydipsia.  Marland Kitchen   Physical Examination Today's Vitals   12/30/20 1137 12/30/20 1138 12/30/20 1139 12/30/20 1141  BP: 115/79 130/86 127/90 112/79  Pulse: 84 84 87 (!) 111  SpO2:  98% 98%   Weight:      Height:       Body mass index is 24.06 kg/m.  Gen: resting comfortably, no acute distress HEENT: no scleral icterus, pupils equal round and reactive, no palptable cervical adenopathy,  CV: RRR, no m/r/g, no jvd Resp: Clear to auscultation bilaterally GI: abdomen is soft, non-tender,  non-distended, normal bowel sounds, no hepatosplenomegaly MSK: extremities are warm, no edema.  Skin: warm, no rash Neuro:  no focal deficits Psych: appropriate affect      Assessment and Plan  1. Dizziness - symptoms and orthostatics suggest orthostatic dizziness, perhaps some combined component of vertigo - already hydrating aggressively, we will try florinef 0.1mg  daily and monitor symptoms       Antoine Poche, M.D.

## 2020-12-30 NOTE — Patient Instructions (Signed)
Your physician recommends that you schedule a follow-up appointment in: 3 MONTHS WITH DR [facility]  Your physician has recommended you make the following change in your medication:   START FLORINEF 0.1 MG DAILY   Thank you for choosing [facility]!!

## 2021-01-05 ENCOUNTER — Encounter: Payer: Self-pay | Admitting: Adult Health

## 2021-01-08 ENCOUNTER — Encounter: Payer: Self-pay | Admitting: Dietician

## 2021-01-08 ENCOUNTER — Other Ambulatory Visit: Payer: Self-pay

## 2021-01-08 ENCOUNTER — Encounter: Payer: 59 | Attending: Family Medicine | Admitting: Dietician

## 2021-01-08 DIAGNOSIS — K219 Gastro-esophageal reflux disease without esophagitis: Secondary | ICD-10-CM

## 2021-01-08 DIAGNOSIS — R197 Diarrhea, unspecified: Secondary | ICD-10-CM

## 2021-01-08 DIAGNOSIS — R1030 Lower abdominal pain, unspecified: Secondary | ICD-10-CM

## 2021-01-08 NOTE — Patient Instructions (Addendum)
Increase your salt intake with your foods.  Add in a "rehydration packet" to your water daily.  Consider taking Benefiber or Metamucil to increase fiber intake.  Try making protein shakes at home with skim milk, a small amount of frozen fruit and a nut butter.  Limit your water intake to a gallon a day, unless severe diarrhea or vomiting.  Discuss getting your blood osmolality analyzed with your PCP.

## 2021-01-08 NOTE — Progress Notes (Signed)
Medical Nutrition Therapy  Appointment Start time:  1120  Appointment End time:  1230  Primary concerns today: Dizziness, GERD  Referral diagnosis: R53.83 Other fatigue, R19.7 Frequent diarrhea, R53.1 Weakness Preferred learning style: No preference indicated Learning readiness: Ready   NUTRITION ASSESSMENT   Anthropometrics  Wt: 136.6 lbs Ht: 5\' 3"  Body mass index is 24.2 kg/m.   Clinical Medical Hx: Endometreosis, hypotension, cholecystectomy Medications: Protonix, Zofran Labs: Ferritin - 185 (high), Iron saturation (42%, in range) Notable Signs/Symptoms: Chronic diarrhea, dizziness, N/V  Lifestyle & Dietary Hx **Pt reports constant orthostatic hypotension, frequent diarrhea and vomiting** Pt saw cardiologist last week and was put on Florinef to keep blood pressure up.  Pt reports having an endoscopy and colonoscopy in November that were both negative for blood loss. Pt reports being sick for a year and a half. Gall bladder removed last January. Pt has changed their diet since, but symptoms have gotten worse. Pt has removed greasy foods from their diet.  Pt weight has only dropped a tiny bit since becoming sick. Pt states their clothes are a little looser (dropped 3 sizes) and they have lost weight in their face. Dietitian noticed no sunken eyes or temporal wasting. Pt reports not being able to tolerate meat, pt reports being very sensitive to meat for 4-5 years. Pt was tested for alpha-gal and came back negative.  Pt was vegetarian for a few years when meat sensitivity arose. Pt reports migraines since 35 y/o. Pt has been on monthly Ajovy and had less migraines. Pt reports trying multiple protein shakes and vomiting every time. Tried Atkins, [place]. Pt tried hemp protein and experienced less trouble. Pt reports drinking a lot of water because being constantly thirsty. Pt reports stomach hurts really bad if they eat vegetables at lunch and then dinner. Pt reports mother  has rheumatoid arthritis (familial autoimmune)   Estimated daily fluid intake: ~140 oz Supplements: B12, MVU Sleep: A lot currently due to fatigue/dizziness Stress / self-care: Very Low Current average weekly physical activity: N/A due to fatigue and dizziness.  24-Hr Dietary Recall First Meal: 2 plain biscuits, 1 cup of coffee Snack: Breakfast bar Second Meal: Crackers and string cheese, cup of strawberry Jello Snack: none Third Meal: Green beans and a roll Snack: none Beverages: water, coffee Pt reports vomiting later in the evening   NUTRITION DIAGNOSIS  Martinez-1.4 Altered GI function As related to N/V/D.  As evidenced by frequent GERD, diarrhea, and hypotension.   NUTRITION INTERVENTION  Nutrition education (E-1) on the following topics:  . Educated patient on the role of sodium in increasing blood pressure. Educate patient on potential dehydration a/w N/V/D. Educate patient on the importance of adequate protein intake in times of low appetite and intake. Educate patient on osmolality and the potential effects of variances of this value in the body.  Handouts Provided Include   N/A  Learning Style & Readiness for Change Teaching method utilized: Visual & Auditory  Demonstrated degree of understanding via: Teach Back  Barriers to learning/adherence to lifestyle change: Constant N/V/D  Goals Established by Pt  Increase your salt intake with your foods.  Add in a "rehydration packet" to your water daily.  Consider taking Benefiber or Metamucil to increase fiber intake.  Try making protein shakes at home with skim milk, a small amount of frozen fruit and a nut butter.  Limit your water intake to a gallon a day, unless severe diarrhea or vomiting.  Discuss getting your blood osmolality analyzed with your  PCP.   MONITORING & EVALUATION Dietary intake, weekly physical activity, and N/V/D and dizziness in 1 month.  Next Steps  Patient is to follow up with RDN.

## 2021-01-13 ENCOUNTER — Other Ambulatory Visit: Payer: Self-pay | Admitting: Family Medicine

## 2021-01-19 ENCOUNTER — Encounter (HOSPITAL_COMMUNITY): Payer: Self-pay | Admitting: Physical Therapy

## 2021-01-19 ENCOUNTER — Other Ambulatory Visit: Payer: Self-pay

## 2021-01-19 ENCOUNTER — Ambulatory Visit (HOSPITAL_COMMUNITY): Payer: 59 | Attending: Adult Health | Admitting: Physical Therapy

## 2021-01-19 DIAGNOSIS — R42 Dizziness and giddiness: Secondary | ICD-10-CM | POA: Insufficient documentation

## 2021-01-19 DIAGNOSIS — R262 Difficulty in walking, not elsewhere classified: Secondary | ICD-10-CM | POA: Insufficient documentation

## 2021-01-19 NOTE — Therapy (Signed)
[facility] [address] [place], [place], [address] Phone: [phone]   Fax:  [phone]  Physical Therapy Evaluation  Patient Details  Name: Stacey Compton MRN: [id number] Date of Birth: 1985-12-23 Referring Provider (PT): Butch PennyMegan Millikan NP   Encounter Date: 01/19/2021   PT End of Session - 01/19/21 1047    Visit Number 1    Number of Visits 6    Date for PT Re-Evaluation 03/02/21    Authorization Type [employer], 30 VL chiro and PT, no auth    Authorization - Visit Number 1    Authorization - Number of Visits 30    Progress Note Due on Visit 10    PT Start Time 1048    PT Stop Time 1120    PT Time Calculation (min) 32 min    Activity Tolerance Patient tolerated treatment well    Behavior During Therapy [facility] for tasks assessed/performed           Past Medical History:  Diagnosis Date  . Anemia   . Anxiety   . Depression    denies  . Endometriosis   . Hx of varicella   . IBS (irritable bowel syndrome)   . Infection    UTI  . Migraines    with aura  . PONV (postoperative nausea and vomiting)     Past Surgical History:  Procedure Laterality Date  . BIOPSY  09/11/2020   Procedure: BIOPSY;  Surgeon: Lanelle Balarver, Charles K, DO;  [facility];  Service: Endoscopy;;  . CHOLECYSTECTOMY N/A 11/18/2019   Procedure: LAPAROSCOPIC CHOLECYSTECTOMY;  Surgeon: Lucretia RoersBridges, Lindsay C, MD;  Location: [facility];  Service: General;  Laterality: N/A;  . COLONOSCOPY WITH PROPOFOL N/A 09/11/2020   Procedure: COLONOSCOPY WITH PROPOFOL;  Surgeon: Lanelle Balarver, Charles K, DO;  [facility];  Service: Endoscopy;  Laterality: N/A;  10:30am  . ESOPHAGOGASTRODUODENOSCOPY (EGD) WITH PROPOFOL N/A 09/11/2020   Procedure: ESOPHAGOGASTRODUODENOSCOPY (EGD) WITH PROPOFOL;  Surgeon: Lanelle Balarver, Charles K, DO;  [facility];  Service: Endoscopy;  Laterality: N/A;  . LAPAROSCOPY     with chromopertubation  . OTHER SURGICAL HISTORY     dx lap  for endometriosis    There were no vitals filed for this visit.    Subjective Assessment - 01/19/21 1125    Subjective States that about 2 months ago her symptoms started with constant dizziness. States she has had a few falling and collapsing spells but she comes to after a few minutes. States that she has been to a couple of tests to figure out what is going on. States neurology sent her for PT. States that have noticed some orthostatic responses. States that she occasional has heaviness all over and she can't lift her head or her hands. States she does have some ringing in her ears. States she has good and bad days. States that yesterday overall was a good day to start. Sitting up quickly aggravates it. States that the longer that she is up the worse her symptoms seem to get and is not necessarily position dependent. States she hasn't been driving in 2 months. Reports that she has 5/10 dizziness right now. States that it feels like she is in a fog but the room is not currently spinning. States that most of the time it feels like heaviness.    Pertinent History migraines, headaches    Patient Stated Goals to be able to take care of her kids without needing assist  Currently in Pain? Other (Comment)   5/10 heaviness and dizziness             [facility] PT Assessment - 01/19/21 0001      Assessment   Medical Diagnosis Vestibular/ BPPV    Referring Provider (PT) Butch Penny NP    Prior Therapy no      Balance Screen   Has the patient fallen in the past 6 months Yes    How many times? 12    Has the patient had a decrease in activity level because of a fear of falling?  Yes    Is the patient reluctant to leave their home because of a fear of falling?  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     Cognition   Overall Cognitive Status Within Functional Limits for tasks assessed      Observation/Other Assessments   Focus on Therapeutic Outcomes (FOTO)  50% function                  Vestibular Assessment - 01/19/21 0001      Symptom Behavior   Type of Dizziness  "Funny feeling in head";Unsteady with head/body turns    Frequency of Dizziness constant    Duration of Dizziness constant    Symptom Nature Constant    Aggravating Factors Turning body quickly;Turning head quickly;No known aggravating factors;Activity in general;Forward bending    Relieving Factors Slow movements;Rest;Closing eyes;Head stationary;Dark room    Progression of Symptoms Worse      Oculomotor Exam   Spontaneous Absent    Gaze-induced  Absent    Head shaking Horizontal Absent    Head Shaking Vertical Absent    Smooth Pursuits Intact    Saccades Intact   heaviness noted     Positional Testing   Dix-Hallpike Dix-Hallpike Right;Dix-Hallpike Left      Dix-Hallpike Right   Dix-Hallpike Right Duration negative      Dix-Hallpike Left   Dix-Hallpike Left Duration negative      Positional Sensitivities   Supine to Left Side Mild dizziness    Supine to Right Side Mild dizziness    Supine to Sitting Moderate dizziness    Right Hallpike Mild dizziness    Up from Right Hallpike Mild dizziness    Up from Left Hallpike Mild dizziness    Rolling Right Moderate dizziness    Rolling Left Mild dizziness              Objective measurements completed on examination: See above findings.               PT Education - 01/19/21 1123    Education  Details on current findings, on presentation, on POC and habituation exercises    Person(s) Educated Patient    Methods Explanation    Comprehension Verbalized understanding  PT Short Term Goals - 01/19/21 1120      PT SHORT TERM GOAL #1   Title Patient will be independent in self management strategies to improve quality of life and functional outcomes.    Time 3    Period Weeks    Status New    Target Date 02/09/21      PT SHORT TERM GOAL #2   Title Patient will report at least 25% improvement in overall symptoms and/or function to demonstrate improved functional mobility    Time 3    Period Weeks    Status New    Target Date 02/09/21      PT SHORT TERM GOAL #3   Title Patient will be able to roll over in bed without increase in dizziness symptoms    Time 3    Period Weeks    Status New    Target Date 02/09/21             PT Long Term Goals - 01/19/21 1121      PT LONG TERM GOAL #1   Title Patient will be able to ambulate with WNL stride length  to demonstrate improved feelings of stability while walking    Time 6    Period Weeks    Status New    Target Date 03/02/21      PT LONG TERM GOAL #2   Title Patient will improve on FOTO score to meet predicted outcomes to demonstrate improved functional mobility.    Time 6    Period Weeks    Status New    Target Date 03/02/21      PT LONG TERM GOAL #3   Title Patient will report at least 50% improvement in overall symptoms and/or function to demonstrate improved functional mobility    Time 6    Period Weeks    Status New    Target Date 03/02/21                  Plan - 01/19/21 1123    Clinical Impression Statement Patient presents to therapy with dizziness that started about 2 months ago. During today's session patient did not present with peripheral or central symptoms but discussed developing home program focusing on habituation exercises to see if habituation would help with patient's  current symptoms. Educated patient and answered all questions and patient is currently under the care of cardiologist and neurologist. Patient would greatly benefit form skilled physical therapy to improve overall function and return her to optimal quality of life.    Personal Factors and Comorbidities Comorbidity 1;Comorbidity 2    Comorbidities migraines, headaches, low blood pressure, stomach issues    Examination-Activity Limitations Bed Mobility;Squat;Stairs;Stand;Transfers;Lift;Locomotion Level;Carry    Examination-Participation Restrictions Cleaning;Community Activity;Driving;Laundry;Meal Prep;Shop    Stability/Clinical Decision Making Evolving/Moderate complexity    Rehab Potential Good    PT Frequency 1x / week    PT Duration 6 weeks    PT Treatment/Interventions ADLs/Self Care Home Management;Vestibular;Manual techniques;Joint Manipulations;Neuromuscular re-education;Gait training;Functional mobility training;Therapeutic activities;Therapeutic exercise;Balance training    PT Next Visit Plan habituation exercises, perform epley if true vertigo presents, develop HEP    PT Home Exercise Plan rolling in bed left and right    Consulted and Agree with Plan of Care Patient           Patient will benefit from skilled therapeutic intervention in order to improve the following deficits and impairments:  Difficulty walking,Decreased mobility,Decreased balance,Dizziness,Decreased activity tolerance  Visit Diagnosis: Difficulty in walking,  not elsewhere classified - Plan: PT plan of care cert/re-cert  Dizziness and giddiness - Plan: PT plan of care cert/re-cert     Problem List Patient Active Problem List   Diagnosis Date Noted  . Diarrhea 07/21/2020  . Nausea with vomiting 07/21/2020  . Abdominal pain 07/21/2020  . Abdominal cramping 07/21/2020  . Lower abdominal pain 07/21/2020  . Gallstones without obstruction of gallbladder 11/01/2019  . Encounter for induction of labor  04/19/2018  . Frequent headaches 07/06/2017  . Neck pain 07/06/2017  . Gastroesophageal reflux disease without esophagitis 04/01/2016  . Anxiety and depression 04/01/2016  . Classic migraine 08/20/2015  . Shoulder tendinitis 08/08/2013  . Muscle spasm of right shoulder 08/08/2013  . Vaginal delivery 04/08/2013    Class: Status post    11:30 AM, 01/19/21 Tereasa Coop, DPT Physical Therapy with [facility]  [phone] office   [facility] [address] [place], [place], [address] Phone: [phone]   Fax:  [phone]  Name: Stacey Compton MRN: [id number] Date of Birth: February 07, 1986

## 2021-01-25 NOTE — Telephone Encounter (Signed)
Pt states same symptoms as over the past 2 months. Seeing specialist. Things not getting better. Med from cardiologist is not helping. Did talk with GI since sending message to [place]. Has appt with GI this Thursday. Pt states no new symptoms since seeing dr Lorin Picket. Pt wants to see if dr scott wants to see her before seeing GI this week. When asked if she wanted appt tomorrow she states just to see what dr Lorin Picket thinks. Tried to get symptoms but kept saying the same as when seen and dr scott knows what is going on. States she is exactly the same as when seen. Still dizzy and not able to drive.

## 2021-01-26 NOTE — Telephone Encounter (Signed)
Nurses Certainly a follow-up with her specialist may be warranted But in addition to that a follow-up with [place] would be wise because we may be able to talk about what else or other specialist could be tried  Next week 11 AM slot with blocking the 1130 slot either Monday Tuesday or Wednesday Currently this week I am full

## 2021-01-27 ENCOUNTER — Ambulatory Visit (HOSPITAL_COMMUNITY): Payer: 59 | Admitting: Physical Therapy

## 2021-01-27 ENCOUNTER — Other Ambulatory Visit: Payer: Self-pay

## 2021-01-27 DIAGNOSIS — R262 Difficulty in walking, not elsewhere classified: Secondary | ICD-10-CM

## 2021-01-27 DIAGNOSIS — R42 Dizziness and giddiness: Secondary | ICD-10-CM

## 2021-01-28 ENCOUNTER — Encounter (HOSPITAL_COMMUNITY): Payer: Self-pay | Admitting: Physical Therapy

## 2021-01-28 ENCOUNTER — Encounter: Payer: Self-pay | Admitting: Internal Medicine

## 2021-01-28 ENCOUNTER — Ambulatory Visit (INDEPENDENT_AMBULATORY_CARE_PROVIDER_SITE_OTHER): Payer: 59 | Admitting: Internal Medicine

## 2021-01-28 VITALS — BP 105/73 | HR 77 | Temp 97.5°F | Ht 63.0 in | Wt 137.4 lb

## 2021-01-28 DIAGNOSIS — R112 Nausea with vomiting, unspecified: Secondary | ICD-10-CM | POA: Diagnosis not present

## 2021-01-28 DIAGNOSIS — K580 Irritable bowel syndrome with diarrhea: Secondary | ICD-10-CM | POA: Diagnosis not present

## 2021-01-28 DIAGNOSIS — K219 Gastro-esophageal reflux disease without esophagitis: Secondary | ICD-10-CM

## 2021-01-28 IMAGING — CT CT ABD-PELV W/ CM
2 of 4 series · 16 of 46 positions shown, 18 images · IV contrast (omnipaque)
Comparison: None.

CLINICAL DATA: Lower abdominal pain.

EXAM:
CT ABDOMEN AND PELVIS WITH CONTRAST
TECHNIQUE: Multidetector CT imaging of the abdomen and pelvis was performed
using the standard protocol following bolus administration of
intravenous contrast.
CONTRAST:  100mL OMNIPAQUE IOHEXOL 300 MG/ML  SOLN

[Series 2: axial st · axial · 0.81mm/px · z∈[+744,+1139]mm · 13 of 91 slices shown, 15 images]
[im 6/91  soft-tissue]
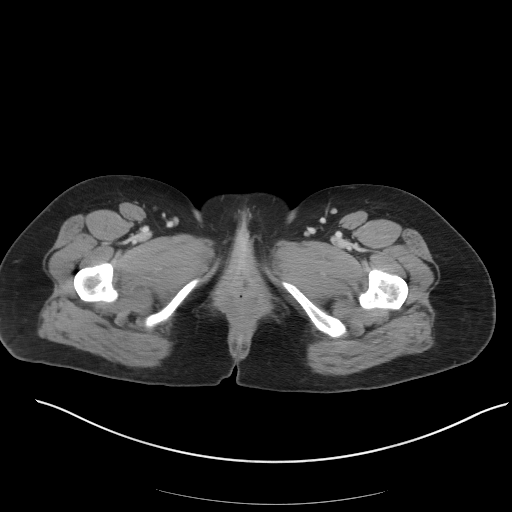
[im 6/91  bone]
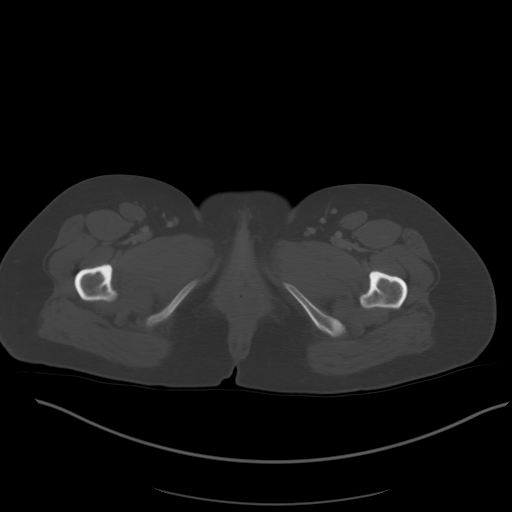
[im 11/91  soft-tissue]
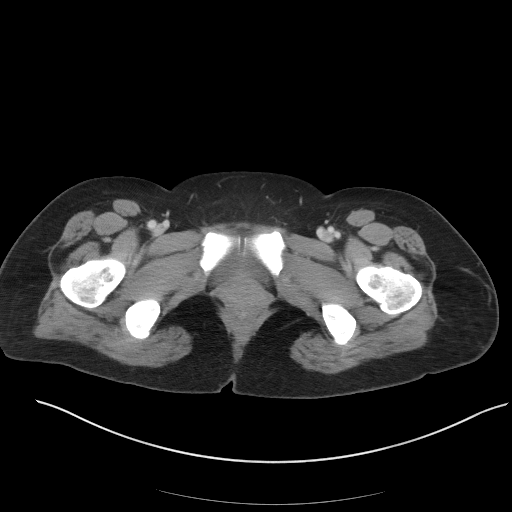
[im 22/91  soft-tissue]
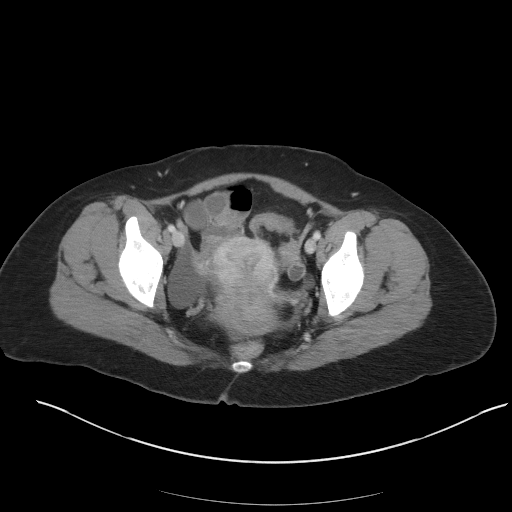
[im 27/91  soft-tissue]
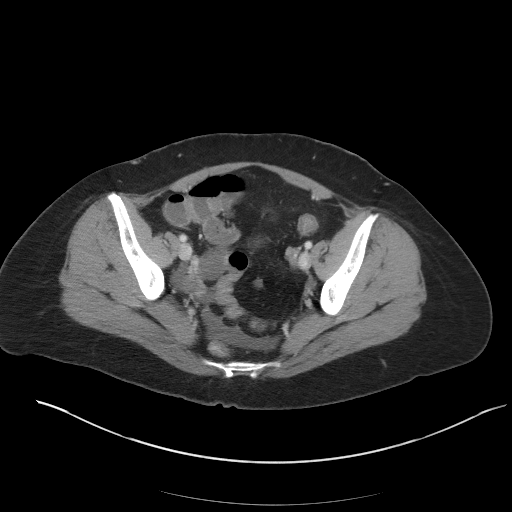
[im 32/91  soft-tissue]
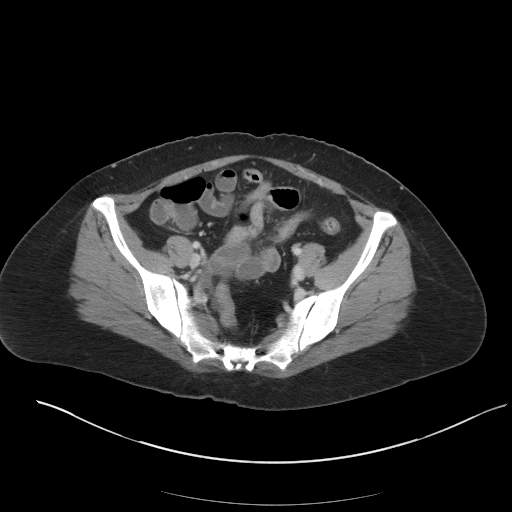
[im 38/91  soft-tissue]
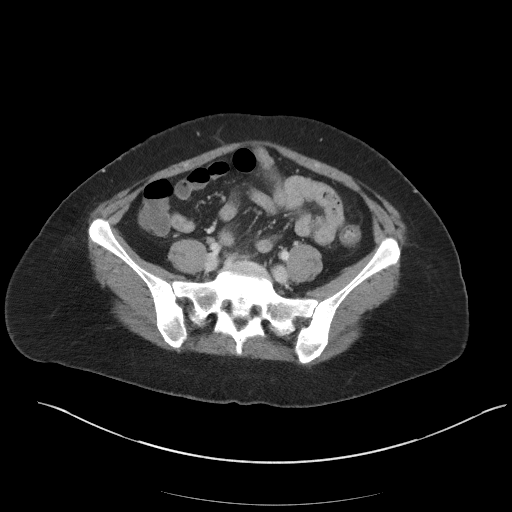
[im 48/91  soft-tissue]
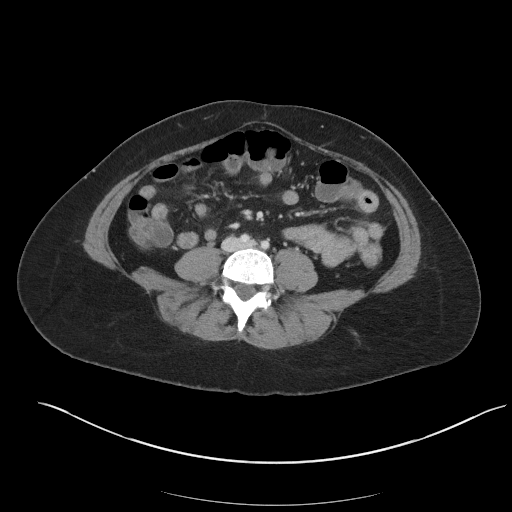
[im 53/91  soft-tissue]
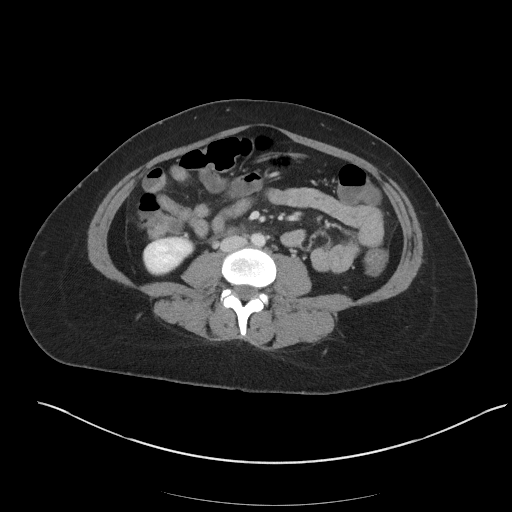
[im 59/91  soft-tissue]
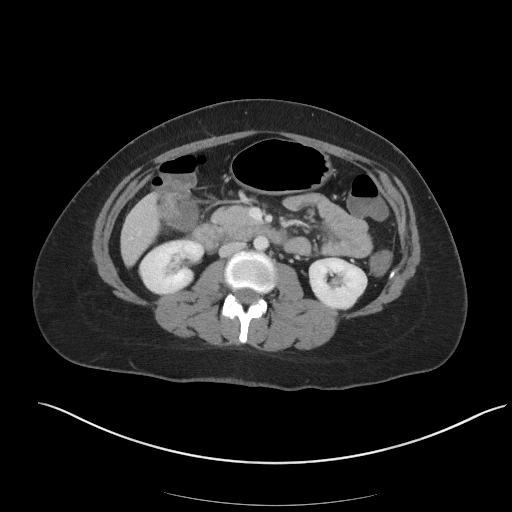
[im 59/91  bone]
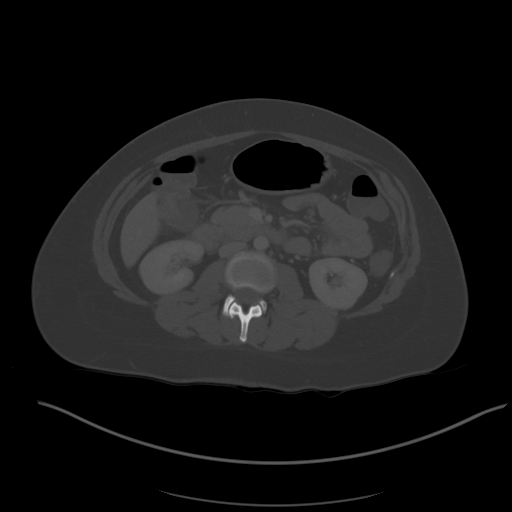
[im 64/91  soft-tissue]
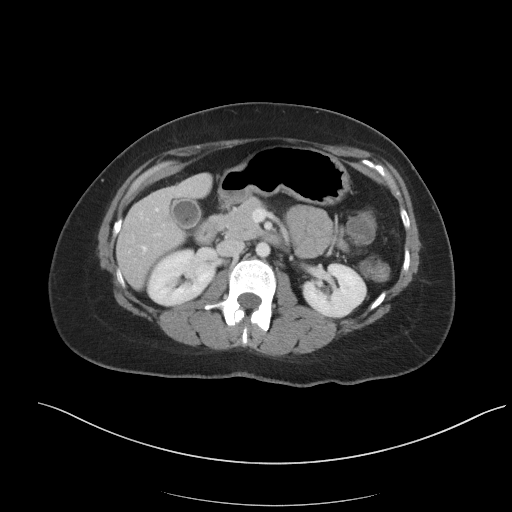
[im 69/91  soft-tissue]
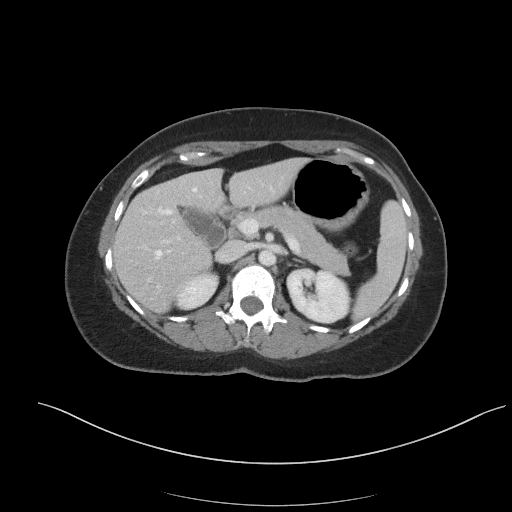
[im 80/91  soft-tissue]
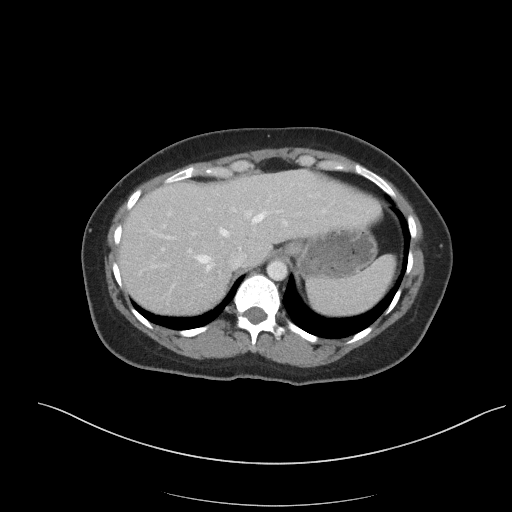
[im 85/91  soft-tissue]
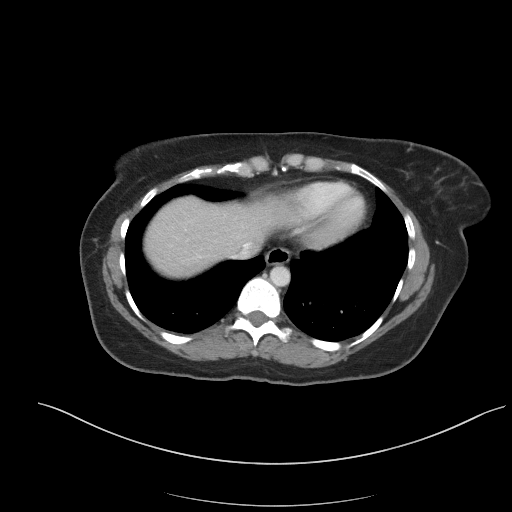

[Series 5: coronal st · coronal · 0.73mm/px · 3 of 92 slices shown]
[im 31/92  soft-tissue]
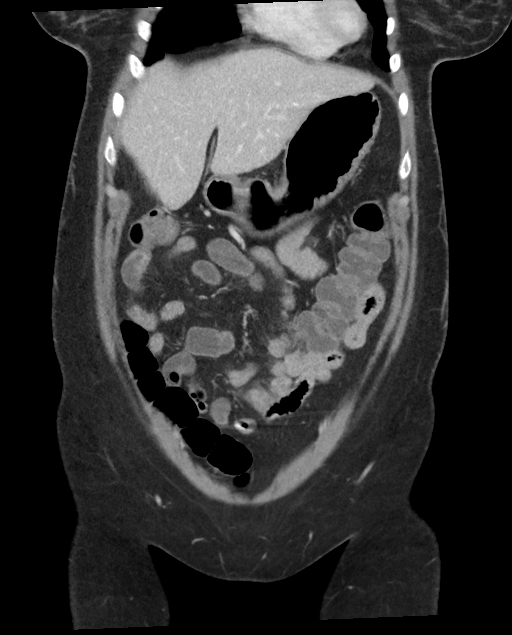
[im 41/92  soft-tissue]
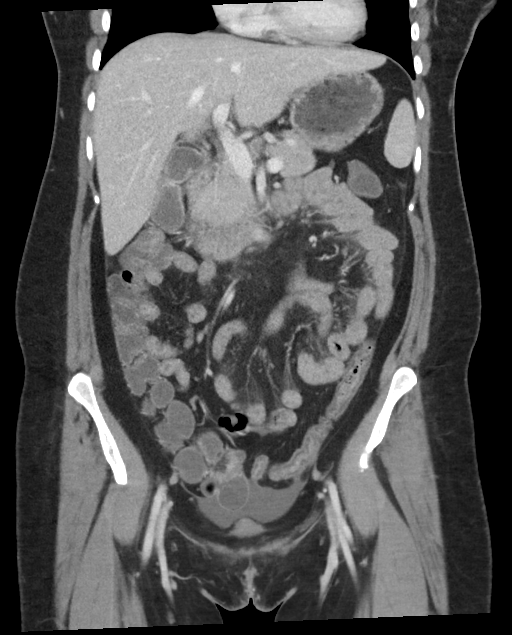
[im 51/92  soft-tissue]
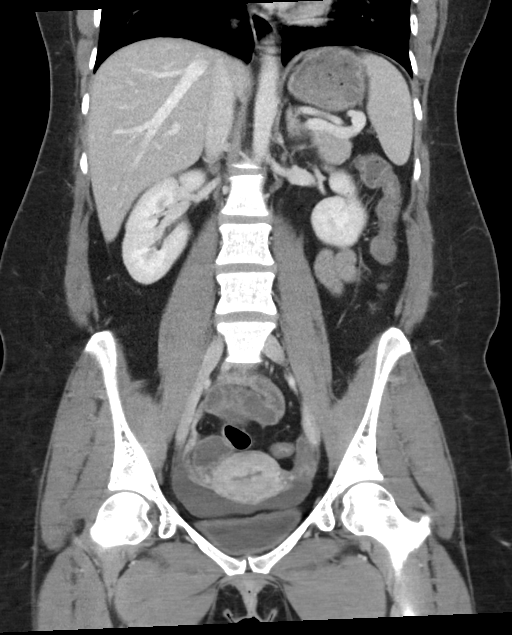

[16 of 46 positions shown; findings below may reference images not displayed]

FINDINGS: Lower chest: Lung bases are clear. No effusions. Heart is normal
size.

Hepatobiliary: Gallstones noted within the gallbladder. No focal
hepatic abnormality.

Pancreas: No focal abnormality or ductal dilatation.

Spleen: No focal abnormality.  Normal size.

Adrenals/Urinary Tract: No adrenal abnormality. No focal renal
abnormality. No stones or hydronephrosis. Urinary bladder is
unremarkable.

Stomach/Bowel: Stomach, large and small bowel grossly unremarkable.
Appendix not definitively seen. No inflammatory process in the right
lower quadrant.

Vascular/Lymphatic: No evidence of aneurysm or adenopathy.

Reproductive: Uterus and adnexa unremarkable.  No mass.

Other: Moderate free fluid in the pelvis.  No free air.

Musculoskeletal: No acute bony abnormality.
IMPRESSION: Cholelithiasis.

Moderate free fluid in the pelvis.

## 2021-01-28 MED ORDER — RIFAXIMIN 550 MG PO TABS: 550.0000 mg | ORAL_TABLET | Freq: Three times a day (TID) | ORAL | 0 refills | Status: AC

## 2021-01-28 NOTE — Therapy (Signed)
[facility] [address] [place], [place], [address] Phone: [phone]   Fax:  [phone]  Physical Therapy Treatment  Patient Details  Name: Stacey Compton MRN: [id number] Date of Birth: 07/18/1986 Referring Provider (PT): Butch Penny NP   Encounter Date: 01/27/2021   PT End of Session - 01/27/21 1201    Visit Number 2    Number of Visits 6    Date for PT Re-Evaluation 03/02/21    Authorization Type [employer], 30 VL chiro and PT, no auth    Authorization - Visit Number 2    Authorization - Number of Visits 30    Progress Note Due on Visit 10    PT Start Time 1123    PT Stop Time 1202    PT Time Calculation (min) 39 min    Activity Tolerance Patient tolerated treatment well    Behavior During Therapy [facility] for tasks assessed/performed           Past Medical History:  Diagnosis Date  . Anemia   . Anxiety   . Depression    denies  . Endometriosis   . Hx of varicella   . IBS (irritable bowel syndrome)   . Infection    UTI  . Migraines    with aura  . PONV (postoperative nausea and vomiting)     Past Surgical History:  Procedure Laterality Date  . BIOPSY  09/11/2020   Procedure: BIOPSY;  Surgeon: Lanelle Bal, DO;  [facility];  Service: Endoscopy;;  . CHOLECYSTECTOMY N/A 11/18/2019   Procedure: LAPAROSCOPIC CHOLECYSTECTOMY;  Surgeon: [name], MD;  Location: [facility];  Service: General;  Laterality: N/A;  . COLONOSCOPY WITH PROPOFOL N/A 09/11/2020   Procedure: COLONOSCOPY WITH PROPOFOL;  Surgeon: Lanelle Bal, DO;  [facility];  Service: Endoscopy;  Laterality: N/A;  10:30am  . ESOPHAGOGASTRODUODENOSCOPY (EGD) WITH PROPOFOL N/A 09/11/2020   Procedure: ESOPHAGOGASTRODUODENOSCOPY (EGD) WITH PROPOFOL;  Surgeon: Lanelle Bal, DO;  [facility];  Service: Endoscopy;  Laterality: N/A;  . LAPAROSCOPY     with chromopertubation  . OTHER SURGICAL HISTORY     dx lap  for endometriosis    There were no vitals filed for this visit.   Subjective Assessment - 01/28/21 0825    Subjective Patient reports ongoing dizziness with standing. She reports compliance with HEP. She feels about the same.    Pertinent History migraines, headaches    Patient Stated Goals to be able to take care of her kids without needing assist    Currently in Pain? No/denies                              Vestibular Treatment/Exercise - 01/28/21 0001      Vestibular Treatment/Exercise   Habituation Exercises Seated Horizontal Head Turns;Seated Vertical Head Turns;Standing Horizontal Head Turns;Standing Vertical Head Turns    Gaze Exercises X1 Viewing Horizontal;Comment   VOR 2 x 10     Seated Horizontal Head Turns   Number of Reps  20    Symptom Description  None      Seated Vertical Head Turns   Number of Reps  20    Symptom Description  None      Standing Horizontal Head Turns   Number of Reps  20    Symptom Description  None      Standing Vertical Head Turns   Number  of Reps  20    Symptom Description  "heavy" feeling      Eye/Head Exercise Vertical   Comment walk test (3:45 with moderate symptom onset) several bouts of standing tolerance throughout session to assess response with prolonged standing                   PT Short Term Goals - 01/19/21 1120      PT SHORT TERM GOAL #1   Title Patient will be independent in self management strategies to improve quality of life and functional outcomes.    Time 3    Period Weeks    Status New    Target Date 02/09/21      PT SHORT TERM GOAL #2   Title Patient will report at least 25% improvement in overall symptoms and/or function to demonstrate improved functional mobility    Time 3    Period Weeks    Status New    Target Date 02/09/21      PT SHORT TERM GOAL #3   Title Patient will be able to roll over in bed without increase in dizziness symptoms    Time 3    Period Weeks     Status New    Target Date 02/09/21             PT Long Term Goals - 01/19/21 1121      PT LONG TERM GOAL #1   Title Patient will be able to ambulate with WNL stride length  to demonstrate improved feelings of stability while walking    Time 6    Period Weeks    Status New    Target Date 03/02/21      PT LONG TERM GOAL #2   Title Patient will improve on FOTO score to meet predicted outcomes to demonstrate improved functional mobility.    Time 6    Period Weeks    Status New    Target Date 03/02/21      PT LONG TERM GOAL #3   Title Patient will report at least 50% improvement in overall symptoms and/or function to demonstrate improved functional mobility    Time 6    Period Weeks    Status New    Target Date 03/02/21                 Plan - 01/28/21 0826    Clinical Impression Statement Continued established POC with habituation progressions. Added head movement in seated, then progressed to head movements with standing. Patient notes slight onset of symptoms with standing head nods. Symptoms appear most effected by standing position versus head movement or visual tracking. After discussion with patient, it seems that standing, walking, is currently the most provocative trigger for symptoms. Conducted walk test to establish baseline for time until symptom onset. Patient able to walk about 4 minutes before moderate onset of symptoms. Encouraged patient to use this as a baseline for home exercise progression and to attempt to increase time spent walking in daily bouts, in order to delay symptom onset. Will assess response at next visit. Also monitored patient Spo2 with standing activity, vital remained consistent at 97-98%, despite report of "feeling heavy" and vision becoming "hazy". This resolved with several minutes of seated rest.    Personal Factors and Comorbidities Comorbidity 1;Comorbidity 2    Comorbidities migraines, headaches, low blood pressure, stomach issues     Examination-Activity Limitations Bed Mobility;Squat;Stairs;Stand;Transfers;Lift;Locomotion Level;Carry    Examination-Participation Restrictions Cleaning;Community Activity;Driving;Laundry;Meal Prep;Shop  Stability/Clinical Decision Making Evolving/Moderate complexity    Rehab Potential Good    PT Frequency 1x / week    PT Duration 6 weeks    PT Treatment/Interventions ADLs/Self Care Home Management;Vestibular;Manual techniques;Joint Manipulations;Neuromuscular re-education;Gait training;Functional mobility training;Therapeutic activities;Therapeutic exercise;Balance training    PT Next Visit Plan habituation exercises, perform epley if true vertigo presents, develop HEP    PT Home Exercise Plan rolling in bed left and right 3/24 daily walking program    Consulted and Agree with Plan of Care Patient           Patient will benefit from skilled therapeutic intervention in order to improve the following deficits and impairments:  Difficulty walking,Decreased mobility,Decreased balance,Dizziness,Decreased activity tolerance  Visit Diagnosis: Difficulty in walking, not elsewhere classified  Dizziness and giddiness     Problem List Patient Active Problem List   Diagnosis Date Noted  . Diarrhea 07/21/2020  . Nausea with vomiting 07/21/2020  . Abdominal pain 07/21/2020  . Abdominal cramping 07/21/2020  . Lower abdominal pain 07/21/2020  . Gallstones without obstruction of gallbladder 11/01/2019  . Encounter for induction of labor 04/19/2018  . Frequent headaches 07/06/2017  . Neck pain 07/06/2017  . Gastroesophageal reflux disease without esophagitis 04/01/2016  . Anxiety and depression 04/01/2016  . Classic migraine 08/20/2015  . Shoulder tendinitis 08/08/2013  . Muscle spasm of right shoulder 08/08/2013  . Vaginal delivery 04/08/2013    Class: Status post   8:40 AM, 01/28/21 Georges Lynch PT DPT  Physical Therapist with [facility]  [facility]  [phone]   [facility] [address] [place], [place], [address] Phone: [phone]   Fax:  [phone]  Name: Stacey Compton MRN: [id number] Date of Birth: [birth year]

## 2021-01-28 NOTE — Progress Notes (Signed)
Referring Provider: Babs Sciara, MD Primary Care Physician:  Babs Sciara, MD Primary GI:  Dr. Marletta Lor  Chief Complaint  Patient presents with  . Diarrhea    Daily, 2-3 times normal day sometimes about 2-3 times a week she feels like she has a stomach flu. Takes bentyl as needed and only takes if she has more than 2-3 episodes in a day but then makes her constipated for a day or so    HPI:   Stacey Compton is a 35 y.o. female who presents to the clinic today for follow up visit. Previously seen for epigastric pain GERD, nausea, chronic diarrhea. All her symptoms started after cholecystectomy January 2021.   She underwent EGD which showed gastritis and peptic duodenitis, negative for H. pylori. Colonoscopy unremarkable with negative random colon biopsies.   I started her on colestipol for postcholecystectomy diarrhea which she states helped some.  I increase this to 2 g twice daily on previous visit.  She states her symptoms were mildly improved.  She was no longer having the washout.  Every few days.    However, due to worsening fatigue and dizziness, I discontinued this medication as this is a known side effect.  She has been off it now for 5 to 6 weeks and states her dizziness is not improved at all.  She is currently being worked up by neurology and cardiology.    Since stopping her colestipol, diarrhea has worsened again.  She is having washout periods every 2 to 3 days as previous.  Is also notes worsening abdominal pain and bloating.  Continues have intermittent nausea.  Past Medical History:  Diagnosis Date  . Anemia   . Anxiety   . Depression    denies  . Endometriosis   . Hx of varicella   . IBS (irritable bowel syndrome)   . Infection    UTI  . Migraines    with aura  . PONV (postoperative nausea and vomiting)     Past Surgical History:  Procedure Laterality Date  . BIOPSY  09/11/2020   Procedure: BIOPSY;  Surgeon: Lanelle Bal, DO;  Location: [facility];  Service: Endoscopy;;  . CHOLECYSTECTOMY N/A 11/18/2019   Procedure: LAPAROSCOPIC CHOLECYSTECTOMY;  Surgeon: [name], MD;  Location: [facility];  Service: General;  Laterality: N/A;  . COLONOSCOPY WITH PROPOFOL N/A 09/11/2020   Procedure: COLONOSCOPY WITH PROPOFOL;  Surgeon: Lanelle Bal, DO;  Location: [facility];  Service: Endoscopy;  Laterality: N/A;  10:30am  . ESOPHAGOGASTRODUODENOSCOPY (EGD) WITH PROPOFOL N/A 09/11/2020   Procedure: ESOPHAGOGASTRODUODENOSCOPY (EGD) WITH PROPOFOL;  Surgeon: Lanelle Bal, DO;  Location: [facility];  Service: Endoscopy;  Laterality: N/A;  . LAPAROSCOPY     with chromopertubation  . OTHER SURGICAL HISTORY     dx lap for endometriosis    Current Outpatient Medications  Medication Sig Dispense Refill  . citalopram (CELEXA) 20 MG tablet TAKE 1 TABLET(20 MG) BY MOUTH AT BEDTIME 30 tablet 0  . clonazePAM (KLONOPIN) 0.5 MG tablet TAKE 1/2 TO 1 (ONE-HALF TO ONE) TABLET BY MOUTH TWICE DAILY AS NEEDED FOR ANXIETY 24 tablet 1  . dicyclomine (BENTYL) 10 MG capsule Take 1 capsule (10 mg total) by mouth 4 (four) times daily -  before meals and at bedtime. (Patient taking differently: Take 10 mg by mouth 4 (four) times daily -  before meals and at bedtime. As needed) 120 capsule 5  . Etonogestrel (NEXPLANON [place]) Inject  1 Device into the skin once.     . famotidine (PEPCID) 40 MG tablet Take one tablet po every morning (Patient taking differently: Take 40 mg by mouth as needed. Take one tablet po every morning) 30 tablet 5  . fludrocortisone (FLORINEF) 0.1 MG tablet Take 1 tablet (0.1 mg total) by mouth daily. 90 tablet 1  . Fremanezumab-vfrm (AJOVY) 225 MG/1.5ML SOAJ Inject 225 mg into the skin every 30 (thirty) days. 1.68 mL 5  . ORILISSA 150 MG TABS Take 150 mg by mouth at bedtime.    . pantoprazole (PROTONIX) 40 MG tablet Take 1 tablet (40 mg total) by mouth daily. 30 tablet 5  . rifaximin (XIFAXAN) 550 MG TABS tablet Take 1 tablet (550  mg total) by mouth 3 (three) times daily for 14 days. 42 tablet 0  . Rimegepant Sulfate (NURTEC) 75 MG TBDP Take 75 mg by mouth daily as needed. Take at the onset of migraine.1 tablet/24 hours 8 tablet 5  . fluticasone (FLOVENT HFA) 44 MCG/ACT inhaler 2 puffs bid 1 each 5  . ondansetron (ZOFRAN) 8 MG tablet Take 8 mg by mouth as needed for nausea or vomiting (migraines.).     Current Facility-Administered Medications  Medication Dose Route Frequency Provider Last Rate Last Admin  . methylPREDNISolone acetate (DEPO-MEDROL) injection 40 mg  40 mg Intra-Lesional Once Merlyn Albert, MD        Allergies as of 01/28/2021 - Review Complete 01/28/2021  Allergen Reaction Noted  . Hydrocodone-homatropine Nausea And Vomiting and Rash 09/03/2015  . Percocet [oxycodone-acetaminophen] Hives and Rash 10/02/2013    Family History  Problem Relation Age of Onset  . Fibromyalgia Mother   . Diabetes Mother        borderline  . Depression Father   . Diabetes Father        type 2  . Colon polyps Father        >60  . Anxiety disorder Sister   . Crohn's disease Sister   . Bipolar disorder Maternal Aunt   . Cancer Maternal Grandmother        mouth and lung  . Bipolar disorder Maternal Grandfather   . Stroke Maternal Grandfather   . Heart disease Paternal Grandfather   . Colon cancer Cousin        maternal  . Celiac disease Neg Hx     Social History   Socioeconomic History  . Marital status: Married    Spouse name: Not on file  . Number of children: Not on file  . Years of education: Not on file  . Highest education level: Not on file  Occupational History  . Not on file  Tobacco Use  . Smoking status: Never Smoker  . Smokeless tobacco: Never Used  Vaping Use  . Vaping Use: Never used  Substance and Sexual Activity  . Alcohol use: No  . Drug use: No  . Sexual activity: Yes    Birth control/protection: None  Other Topics Concern  . Not on file  Social History Narrative  . Not  on file   Social Determinants of Health   Financial Resource Strain: Not on file  Food Insecurity: Not on file  Transportation Needs: Not on file  Physical Activity: Not on file  Stress: Not on file  Social Connections: Not on file    Subjective: Review of Systems  Constitutional: Positive for malaise/fatigue. Negative for chills and fever.  HENT: Negative for congestion and hearing loss.   Eyes: Negative for  blurred vision and double vision.  Respiratory: Negative for cough and shortness of breath.   Cardiovascular: Negative for chest pain and palpitations.  Gastrointestinal: Positive for abdominal pain, diarrhea and nausea. Negative for blood in stool, constipation, heartburn, melena and vomiting.       Bloating  Genitourinary: Negative for dysuria and urgency.  Musculoskeletal: Negative for joint pain and myalgias.  Skin: Negative for itching and rash.  Neurological: Negative for dizziness and headaches.  Psychiatric/Behavioral: Negative for depression. The patient is not nervous/anxious.      Objective: BP 105/73   Pulse 77   Temp (!) 97.5 F (36.4 C)   Ht 5\' 3"  (1.6 m)   Wt 137 lb 6.4 oz (62.3 kg)   BMI 24.34 kg/m  Physical Exam Constitutional:      Appearance: Normal appearance.  HENT:     Head: Normocephalic and atraumatic.  Eyes:     Extraocular Movements: Extraocular movements intact.     Conjunctiva/sclera: Conjunctivae normal.  Cardiovascular:     Rate and Rhythm: Normal rate and regular rhythm.  Pulmonary:     Effort: Pulmonary effort is normal.     Breath sounds: Normal breath sounds.  Abdominal:     General: Bowel sounds are normal.     Palpations: Abdomen is soft.  Musculoskeletal:        General: No swelling. Normal range of motion.     Cervical back: Normal range of motion and neck supple.  Skin:    General: Skin is warm and dry.     Coloration: Skin is not jaundiced.  Neurological:     General: No focal deficit present.     Mental  Status: She is alert and oriented to person, place, and time.  Psychiatric:        Mood and Affect: Mood normal.        Behavior: Behavior normal.      Assessment: *GERD-improved on pantoprazole *Nausea-intermittent *Diarrhea-chronic, worsening   Plan: Patient's upper GI symptoms including GERD is improved on pantoprazole. We will continue for now. Biopsies negative for H. pylori. Avoid NSAIDs.  Diarrhea continues to be an ongoing issue for patient.  The colestipol did help some though I discontinued this due to worsening dizziness.  Appears this is not causing her dizziness so we can consider going back on this medication in the future if need be.   I am going to trial patient on a 14-day course of Xifaxan and monitor for improvement.  If not improved we can consider adding back the colestipol at that time.  Patient has been following low FODMAP diet which seems to be helping as well.  Continue take dicyclomine as needed for pain and diarrhea.  Follow-up in 8 weeks or sooner if needed  Follow-up in  8 to 12 weeks or sooner if needed. 01/28/2021 11:43 AM   Disclaimer: This note was dictated with voice recognition software. Similar sounding words can inadvertently be transcribed and may not be corrected upon review.

## 2021-01-28 NOTE — Patient Instructions (Signed)
For your chronic diarrhea I am going to try you on a 14-day course of Xifaxan 3 times daily for potential small intestinal bacterial overgrowth syndrome.  If this does not work, we will place you back on colestipol.  Continue to take dicyclomine as needed during your washout periods.  Follow-up with me in 6 to 8 weeks or sooner if needed.  At [facility] we value your feedback. You may receive a survey about your visit today. Please share your experience as we strive to create trusting relationships with our patients to provide genuine, compassionate, quality care.  We appreciate your understanding and patience as we review any laboratory studies, imaging, and other diagnostic tests that are ordered as we care for you. Our office policy is 5 business days for review of these results, and any emergent or urgent results are addressed in a timely manner for your best interest. If you do not hear from our office in 1 week, please contact us.   We also encourage the use of MyChart, which contains your medical information for your review as well. If you are not enrolled in this feature, an access code is on this after visit summary for your convenience. Thank you for allowing [place] to be involved in your care.  It was great to see you today!  I hope you have a great rest of your spring!!    Hennie Duos. Marletta Lor, D.O. Gastroenterology and Hepatology [facility]

## 2021-02-01 ENCOUNTER — Telehealth: Payer: Self-pay

## 2021-02-01 NOTE — Telephone Encounter (Signed)
Pt approved to get Xifaxan 550 mg tab with 1 refill from 02/01/2021 to 04/26/2021 as long as this pt is enrolled as a member of her current health plan. Faxed confirmation to [facility] and gave to Darl Pikes to scan into the pt's chart. Phoned and advised the pt as well.

## 2021-02-02 ENCOUNTER — Ambulatory Visit (HOSPITAL_COMMUNITY): Payer: 59

## 2021-02-03 ENCOUNTER — Other Ambulatory Visit: Payer: Self-pay

## 2021-02-03 ENCOUNTER — Encounter: Payer: Self-pay | Admitting: Family Medicine

## 2021-02-03 ENCOUNTER — Ambulatory Visit (INDEPENDENT_AMBULATORY_CARE_PROVIDER_SITE_OTHER): Payer: 59 | Admitting: Family Medicine

## 2021-02-03 VITALS — BP 123/85 | HR 96 | Temp 97.1°F | Wt 137.8 lb

## 2021-02-03 DIAGNOSIS — E538 Deficiency of other specified B group vitamins: Secondary | ICD-10-CM | POA: Diagnosis not present

## 2021-02-03 DIAGNOSIS — R42 Dizziness and giddiness: Secondary | ICD-10-CM

## 2021-02-03 DIAGNOSIS — R5383 Other fatigue: Secondary | ICD-10-CM | POA: Diagnosis not present

## 2021-02-03 NOTE — Progress Notes (Signed)
   Subjective:    Patient ID: Stacey Compton, female    DOB: 11-28-1985, 35 y.o.   MRN: [id number]  HPI Pt here for recheck on vertigo. Pt was seen 12/15/20 for syncope. PT does not think it is vertigo. Pt still having dizziness and fatigue. Pt has worse days but basically has remained the same.  B12 deficiency - Plan: B12 and Folate Panel, Methylmalonic Acid  Dizziness - Plan: B12 and Folate Panel, Methylmalonic Acid, Ambulatory referral to Neurology  Other fatigue - Plan: B12 and Folate Panel, Methylmalonic Acid  This patient has had a fairly thorough work-up with [place]. 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  She has been stressed but denies being depressed currently Unfortunately her nutrition has not been good but it is improving to some degree she is doing a better job getting protein sent She does have chronic intestinal issues along with diarrhea.  This is been worked up by gastroenterology. Tissue transglutaminase in the past has been negative. MRI of the brain normal in the past CT scan of the abdomen normal Review of Systems Please see above    Objective:   Physical Exam Lungs clear respiratory normal heart regular pulse normal orthostatics negative       Assessment & Plan:  1. B12 deficiency We will recheck the B12 is possible she may need B12 injections await results - B12 and Folate Panel - Methylmalonic Acid  2. Dizziness She has a heavy feeling in her head that feels like it pulls her down intermittently has to go to the ground to avoid passing out she has been seen by cardiology they tried Florinef that is not helping she has been seen by  local neurology they helped her with her migraines but nothing else she has a continuous feeling of unsteadiness in her head fatigue feeling in her head as well as a heaviness in her head I believe at this point she needs to be seen by [facility] for further evaluation - B12 and Folate Panel - Methylmalonic Acid - Ambulatory referral to Neurology  3. Other fatigue Labs ordered await results follow-up here in 3 months - B12 and Folate Panel - Methylmalonic Acid  It would be best for this patient to be seen in [facility] for further work-up.  I did prepare her that they may put her through additional test.  I also stated that sometimes in medicine we will find out what it is not but not necessarily find out what it is.  I do not believe that these symptoms are psychosomatic.  But we did discuss how stress.  It would be wise for her to be checked further evaluation  Follow-up in 3 months would be wise

## 2021-02-08 ENCOUNTER — Encounter (HOSPITAL_COMMUNITY): Payer: 59 | Admitting: Physical Therapy

## 2021-02-09 ENCOUNTER — Encounter: Payer: Self-pay | Admitting: Family Medicine

## 2021-02-09 LAB — METHYLMALONIC ACID, SERUM: Methylmalonic Acid: 160 nmol/L (ref 0–378)

## 2021-02-09 LAB — VITAMIN D 25 HYDROXY (VIT D DEFICIENCY, FRACTURES): Vit D, 25-Hydroxy: 28.8 ng/mL — ABNORMAL LOW (ref 30.0–100.0)

## 2021-02-10 ENCOUNTER — Encounter: Payer: Self-pay | Admitting: Family Medicine

## 2021-02-10 DIAGNOSIS — E538 Deficiency of other specified B group vitamins: Secondary | ICD-10-CM

## 2021-02-10 HISTORY — DX: Deficiency of other specified B group vitamins: E53.8

## 2021-02-10 LAB — SPECIMEN STATUS REPORT

## 2021-02-10 LAB — B12 AND FOLATE PANEL
Folate: 12.5 ng/mL (ref >3.0)
Vitamin B-12: 348 pg/mL (ref 232–1245)

## 2021-02-11 ENCOUNTER — Other Ambulatory Visit: Payer: Self-pay

## 2021-02-11 DIAGNOSIS — E538 Deficiency of other specified B group vitamins: Secondary | ICD-10-CM

## 2021-02-11 MED ORDER — CYANOCOBALAMIN 1000 MCG/ML IJ SOLN: 1000.0000 ug | INTRAMUSCULAR | Status: AC

## 2021-02-12 ENCOUNTER — Encounter: Payer: 59 | Attending: Family Medicine | Admitting: Dietician

## 2021-02-12 ENCOUNTER — Telehealth: Payer: Self-pay | Admitting: Family Medicine

## 2021-02-12 DIAGNOSIS — K219 Gastro-esophageal reflux disease without esophagitis: Secondary | ICD-10-CM | POA: Insufficient documentation

## 2021-02-12 DIAGNOSIS — R197 Diarrhea, unspecified: Secondary | ICD-10-CM | POA: Insufficient documentation

## 2021-02-12 DIAGNOSIS — R1030 Lower abdominal pain, unspecified: Secondary | ICD-10-CM | POA: Insufficient documentation

## 2021-02-12 NOTE — Telephone Encounter (Signed)
Patient states she wants to go with the injections and made an appointment for nurse visit Vit B12 injection next week.

## 2021-02-12 NOTE — Telephone Encounter (Signed)
Nurses On previous message regarding her B12-please see labs as well as result notes.  This previous message stated that we would recommend consideration of injections to get the B12 up quickly or she could go with oral B12 1000 mcg daily.  At the end of that message I stated: Please see what the patient would like to do  But at that point the message from the nurse just states that the patient verbalizes understanding without really resolving the question what would the patient like to do.  Please revisit that lab message and proceed forward with either oral supplementation or setting up the injections and documenting thank you

## 2021-02-15 ENCOUNTER — Other Ambulatory Visit: Payer: Self-pay | Admitting: Family Medicine

## 2021-02-16 ENCOUNTER — Ambulatory Visit (HOSPITAL_COMMUNITY): Payer: 59 | Admitting: Physical Therapy

## 2021-02-16 ENCOUNTER — Other Ambulatory Visit (INDEPENDENT_AMBULATORY_CARE_PROVIDER_SITE_OTHER): Payer: 59 | Admitting: *Deleted

## 2021-02-16 ENCOUNTER — Other Ambulatory Visit: Payer: Self-pay

## 2021-02-16 DIAGNOSIS — E538 Deficiency of other specified B group vitamins: Secondary | ICD-10-CM

## 2021-02-16 MED ORDER — CYANOCOBALAMIN 1000 MCG/ML IJ SOLN
1000.0000 ug | Freq: Once | INTRAMUSCULAR | Status: AC
  Administered 2021-02-16: 1000 ug via INTRAMUSCULAR

## 2021-02-16 MED ORDER — VITAMIN D (ERGOCALCIFEROL) 1.25 MG (50000 UNIT) PO CAPS: 50000.0000 [IU] | ORAL_CAPSULE | ORAL | 1 refills | Status: DC

## 2021-02-17 ENCOUNTER — Ambulatory Visit: Payer: 59 | Admitting: Adult Health

## 2021-02-22 ENCOUNTER — Encounter: Payer: Self-pay | Admitting: Family Medicine

## 2021-02-24 ENCOUNTER — Ambulatory Visit: Payer: 59 | Admitting: Neurology

## 2021-02-25 ENCOUNTER — Other Ambulatory Visit: Payer: Self-pay

## 2021-02-25 ENCOUNTER — Other Ambulatory Visit (INDEPENDENT_AMBULATORY_CARE_PROVIDER_SITE_OTHER): Payer: 59

## 2021-02-25 DIAGNOSIS — E538 Deficiency of other specified B group vitamins: Secondary | ICD-10-CM

## 2021-02-25 MED ORDER — CYANOCOBALAMIN 1000 MCG/ML IJ SOLN
1000.0000 ug | Freq: Once | INTRAMUSCULAR | Status: AC
  Administered 2021-02-25: 1000 ug via INTRAMUSCULAR

## 2021-03-03 ENCOUNTER — Other Ambulatory Visit (INDEPENDENT_AMBULATORY_CARE_PROVIDER_SITE_OTHER): Payer: 59 | Admitting: *Deleted

## 2021-03-03 ENCOUNTER — Other Ambulatory Visit: Payer: Self-pay

## 2021-03-03 DIAGNOSIS — E538 Deficiency of other specified B group vitamins: Secondary | ICD-10-CM | POA: Diagnosis not present

## 2021-03-03 MED ORDER — CYANOCOBALAMIN 1000 MCG/ML IJ SOLN
1000.0000 ug | Freq: Once | INTRAMUSCULAR | Status: AC
  Administered 2021-03-03: 1000 ug via INTRAMUSCULAR

## 2021-03-05 ENCOUNTER — Encounter: Payer: Self-pay | Admitting: Family Medicine

## 2021-03-10 ENCOUNTER — Other Ambulatory Visit: Payer: Self-pay

## 2021-03-10 ENCOUNTER — Ambulatory Visit: Payer: 59 | Admitting: Internal Medicine

## 2021-03-10 ENCOUNTER — Other Ambulatory Visit (INDEPENDENT_AMBULATORY_CARE_PROVIDER_SITE_OTHER): Payer: 59 | Admitting: *Deleted

## 2021-03-10 DIAGNOSIS — E538 Deficiency of other specified B group vitamins: Secondary | ICD-10-CM

## 2021-03-10 MED ORDER — CYANOCOBALAMIN 1000 MCG/ML IJ SOLN
1000.0000 ug | Freq: Once | INTRAMUSCULAR | Status: AC
  Administered 2021-03-10: 1000 ug via INTRAMUSCULAR

## 2021-03-15 ENCOUNTER — Other Ambulatory Visit: Payer: Self-pay | Admitting: Family Medicine

## 2021-03-15 ENCOUNTER — Encounter: Payer: Self-pay | Admitting: Family Medicine

## 2021-03-15 ENCOUNTER — Other Ambulatory Visit: Payer: Self-pay | Admitting: Internal Medicine

## 2021-03-18 NOTE — Telephone Encounter (Signed)
Nurses  I reviewed over Arielis's message.  Certainly sorry she is having such difficulty. (Please proceed forward with the following and you may forward a copy of this message to Stormy-thanks-Dr. Lorin Picket) Nurses-several things  #1- please have Toni Amend with referrals resend the referral.  I can see within the electronic record that this was communicated to a person in the referral department at [facility] on April 4 and information was faxed but apparently from that point nothing happened.  So this referral for neurology needs to be resent.  Please have all referrals discussed with [facility] about seeing if they can place her on a cancellation list.  Her situation in type of symptoms she is having really needs the input of a neurology specialist and preferably at a [facility] such as [facility] because they are more likely to be able to handle situations that cannot be solved by standard neurology offices.  #2 unfortunately many places are dealing with significant backlog of patients related to significant influx of patients related to post pandemic increase inpatient utilization.  Simply put many places are quite stacked up and it is difficult for them to see patients very quickly.  Also neurology the visits are longer than a standard family medicine visit so therefore can take several weeks to get in.  Certainly sorry that she is having such issues but please move forward with resending the referral  #3 please have Nyeli schedule a follow-up office visit in the summer months potentially mid June after her visit with neurology that way we can do a follow-up after she is seen cardiology and neurology.  If she feels she needs to be seen sooner please assist with setting her up sooner.  She should go ahead and schedule now because even our office tends to have difficulty getting a quick appointment if the patient waits too long to schedule.  #4 as for the type of neurology I would recommend a general neurologist not a  seizure specialist.

## 2021-03-22 ENCOUNTER — Other Ambulatory Visit: Payer: Self-pay

## 2021-03-22 ENCOUNTER — Encounter: Payer: Self-pay | Admitting: Family Medicine

## 2021-03-22 ENCOUNTER — Ambulatory Visit (INDEPENDENT_AMBULATORY_CARE_PROVIDER_SITE_OTHER): Payer: 59 | Admitting: Family Medicine

## 2021-03-22 VITALS — BP 128/82 | Temp 97.7°F | Wt 137.2 lb

## 2021-03-22 DIAGNOSIS — R5383 Other fatigue: Secondary | ICD-10-CM | POA: Diagnosis not present

## 2021-03-22 DIAGNOSIS — E538 Deficiency of other specified B group vitamins: Secondary | ICD-10-CM

## 2021-03-22 DIAGNOSIS — R55 Syncope and collapse: Secondary | ICD-10-CM

## 2021-03-22 NOTE — Progress Notes (Signed)
   Subjective:    Patient ID: Stacey Compton, female    DOB: 08/24/1986, 35 y.o.   MRN: [id number]  HPI Pt having dizziness and fatigue. Pt has been passing out with total LOC. Father called 911 on Friday due to not being able to wake her up. Pt having brain fog today and dizzy. Saturday evening husband found her out of bed. [facility] appt not until August. Right hand twitching during her fainting spell.    Patient has had a couple spells over the weekend she had a passing out spell while sitting in the bed she was out for anywhere from 8 minutes through possibly 15 minutes.  Her father was with her when this occurred.  He states he did not witness any seizure activity although her right-handed tremor.  Afterwards she was able to relate okay and did not seem confused  The patient relates another time she was found passed out on the floor in her bedroom but does not know how she got there in according to her husband at that time she was out for approximately 15 minutes Review of Systems     Objective:   Physical Exam Lungs clear heart regular pulse normal BP laying sitting standing is good extremities no edema neurologic grossly normal       Assessment & Plan:  1. Other fatigue Will repeat lab test they should be done by the first part of June await the findings - B12 - Vitamin D (25 hydroxy) - Magnesium - Basic Metabolic Panel (BMET)  2. B12 deficiency Patient currently doing B12 supplementation we will see what the level is doing - B12 - Vitamin D (25 hydroxy) - Magnesium - Basic Metabolic Panel (BMET)  3. Syncope, unspecified syncope type It is hard to know what is causing the spells they happen intermittently 2 different spells within the past few weeks once with her sitting in bed sitting up another 1 where she was found on the floor unconscious for 10 minutes both of these spells last for a period of time which raises some concern of the possibility of underlying seizure disorder  versus other neurologic issue.  If it was purely a Cardiologic issue it should clear up immediately.  We will connect with neurology as well as try to see if the specialist at [facility] can get her in sooner  Follow-up here as needed within 3 to 4 months keep [place] posted on how things are going - B12 - Vitamin D (25 hydroxy) - Magnesium - Basic Metabolic Panel (BMET)

## 2021-03-25 ENCOUNTER — Telehealth: Payer: Self-pay

## 2021-03-25 NOTE — Telephone Encounter (Signed)
Pt states not sure what to do. States she got some tips in case she passed out while waiting to get in with [facility] in august. Each day feels worse. Passed out again last night and again about 2 hours ago. Both times she was already in the bed. Same as the other times. Husband is on the way home now to take her to ED and pt wonders which ED Dr. Lorin Picket would want her to go to. Consult with Dr. Lorin Picket and he recommends going to [facility]. Pt states she will try and work it out to go to [facility] ER

## 2021-03-25 NOTE — Telephone Encounter (Signed)
Stacey Compton was in here Monday to see Dr Lorin Picket and she is not improving and wanting to know what she needs to do?   Pt call back [phone]

## 2021-03-30 ENCOUNTER — Ambulatory Visit (INDEPENDENT_AMBULATORY_CARE_PROVIDER_SITE_OTHER): Payer: 59 | Admitting: Cardiology

## 2021-03-30 ENCOUNTER — Encounter: Payer: Self-pay | Admitting: Cardiology

## 2021-03-30 VITALS — BP 136/72 | HR 77 | Ht 63.0 in | Wt 136.4 lb

## 2021-03-30 DIAGNOSIS — R55 Syncope and collapse: Secondary | ICD-10-CM | POA: Diagnosis not present

## 2021-03-30 DIAGNOSIS — R42 Dizziness and giddiness: Secondary | ICD-10-CM

## 2021-03-30 NOTE — Patient Instructions (Addendum)
Medication Instructions:  Continue all current medications.  Labwork: none  Testing/Procedures: none  Follow-Up: Pending   Any Other Special Instructions Will Be Listed Below (If Applicable). Will wait to see monitor results.   If you need a refill on your cardiac medications before your next appointment, please call your pharmacy.

## 2021-03-30 NOTE — Progress Notes (Signed)
Clinical Summary Ms. Masin is a 35 y.o.female seen as a new patient for the following medical problems.   1. Dizziness/Syncope  last visit she reproted dizziness off and on for 1 year, recently worsening around Christmas time - over the last 3 weeks has constant dizziness. Worst with standing. Feeling of lightheadedness, can have some feeling of room spinning. The spinning portion can come on with movement - can have some orthostatic dizzienss with standing - no palpitations  - 40 oz of water x 2-3, coffee in AM, gatorade x2-3 smaller bottles - diarrhea daily, frequent loose watery stools.  - seen by neuro, notes mention epley maneuver did cause some dizziness. HR 84-->111 - orthostatics: DBP down 11 points with standing, - last visit we started florinef 0.1mg  daily  - since last visit mulitple episodes of syncope.  [facility] ER visit earlier this month with syncope Echo was benign - was setup with outpatient holter   - episodes of syncope, 6-7 times over the last week - last episode on Sunday roughly 1pm. Laying in bed, feels heavy in the head and hands get heavy. Pressure right side of neck. Some dizziness. +LOC. Reported 10-15 min, completley unresponsive per report but breathing with pulse.  - episode Thursday in bed similar episode.       Past Medical History:  Diagnosis Date  . Anemia   . Anxiety   . B12 deficiency 02/10/2021   B12 low end of normal patient with significant fatigue will go ahead and treat April 2020  . Depression    denies  . Endometriosis   . Hx of varicella   . IBS (irritable bowel syndrome)   . Infection    UTI  . Migraines    with aura  . PONV (postoperative nausea and vomiting)      Allergies  Allergen Reactions  . Hydrocodone Bit-Homatrop Mbr Nausea And Vomiting and Rash    Hycodan   . Percocet [Oxycodone-Acetaminophen] Hives and Rash     Current Outpatient Medications  Medication Sig Dispense Refill  . citalopram (CELEXA)  20 MG tablet TAKE 1 TABLET(20 MG) BY MOUTH AT BEDTIME 30 tablet 4  . clonazePAM (KLONOPIN) 0.5 MG tablet TAKE 1/2 TO 1 (ONE-HALF TO ONE) TABLET BY MOUTH TWICE DAILY AS NEEDED FOR ANXIETY 24 tablet 1  . dicyclomine (BENTYL) 10 MG capsule Take 1 capsule (10 mg total) by mouth 4 (four) times daily -  before meals and at bedtime. (Patient taking differently: Take 10 mg by mouth 4 (four) times daily -  before meals and at bedtime. As needed) 120 capsule 5  . Etonogestrel (NEXPLANON [place]) Inject 1 Device into the skin once.     . famotidine (PEPCID) 40 MG tablet Take one tablet po every morning (Patient taking differently: Take 40 mg by mouth as needed. Take one tablet po every morning) 30 tablet 5  . fludrocortisone (FLORINEF) 0.1 MG tablet Take 1 tablet (0.1 mg total) by mouth daily. 90 tablet 1  . Fremanezumab-vfrm (AJOVY) 225 MG/1.5ML SOAJ Inject 225 mg into the skin every 30 (thirty) days. 1.68 mL 5  . ORILISSA 150 MG TABS Take 150 mg by mouth at bedtime.    . pantoprazole (PROTONIX) 40 MG tablet TAKE 1 TABLET(40 MG) BY MOUTH DAILY 30 tablet 5  . Rimegepant Sulfate (NURTEC) 75 MG TBDP Take 75 mg by mouth daily as needed. Take at the onset of migraine.1 tablet/24 hours 8 tablet 5  . Vitamin D, Ergocalciferol, (DRISDOL) 1.25  MG (50000 UNIT) CAPS capsule Take 1 capsule (50,000 Units total) by mouth every 7 (seven) days. 4 capsule 1   Current Facility-Administered Medications  Medication Dose Route Frequency Provider Last Rate Last Admin  . methylPREDNISolone acetate (DEPO-MEDROL) injection 40 mg  40 mg Intra-Lesional Once Merlyn Albert, MD         Past Surgical History:  Procedure Laterality Date  . BIOPSY  09/11/2020   Procedure: BIOPSY;  Surgeon: Lanelle Bal, DO;  [facility];  Service: Endoscopy;;  . CHOLECYSTECTOMY N/A 11/18/2019   Procedure: LAPAROSCOPIC CHOLECYSTECTOMY;  Surgeon: [name], MD;  Location: [facility];  Service: General;  Laterality: N/A;  . COLONOSCOPY  WITH PROPOFOL N/A 09/11/2020   Procedure: COLONOSCOPY WITH PROPOFOL;  Surgeon: Lanelle Bal, DO;  [facility];  Service: Endoscopy;  Laterality: N/A;  10:30am  . ESOPHAGOGASTRODUODENOSCOPY (EGD) WITH PROPOFOL N/A 09/11/2020   Procedure: ESOPHAGOGASTRODUODENOSCOPY (EGD) WITH PROPOFOL;  Surgeon: Lanelle Bal, DO;  [facility];  Service: Endoscopy;  Laterality: N/A;  . LAPAROSCOPY     with chromopertubation  . OTHER SURGICAL HISTORY     dx lap for endometriosis     Allergies  Allergen Reactions  . Hydrocodone Bit-Homatrop Mbr Nausea And Vomiting and Rash    Hycodan   . Percocet [Oxycodone-Acetaminophen] Hives and Rash      Family History  Problem Relation Age of Onset  . Fibromyalgia Mother   . Diabetes Mother        borderline  . Depression Father   . Diabetes Father        type 2  . Colon polyps Father        >60  . Anxiety disorder Sister   . Crohn's disease Sister   . Bipolar disorder Maternal Aunt   . Cancer Maternal Grandmother        mouth and lung  . Bipolar disorder Maternal Grandfather   . Stroke Maternal Grandfather   . Heart disease Paternal Grandfather   . Colon cancer Cousin        maternal  . Celiac disease Neg Hx      Social History Ms. Wass reports that she has never smoked. She has never used smokeless tobacco. Ms. Neglia reports no history of alcohol use.   Review of Systems CONSTITUTIONAL: No weight loss, fever, chills, weakness or fatigue.  HEENT: Eyes: No visual loss, blurred vision, double vision or yellow sclerae.No hearing loss, sneezing, congestion, runny nose or sore throat.  SKIN: No rash or itching.  CARDIOVASCULAR: per hpi RESPIRATORY: No shortness of breath, cough or sputum.  GASTROINTESTINAL: No anorexia, nausea, vomiting or diarrhea. No abdominal pain or blood.  GENITOURINARY: No burning on urination, no polyuria NEUROLOGICAL:per neuro  MUSCULOSKELETAL: No muscle, back pain, joint pain or  stiffness.  LYMPHATICS: No enlarged nodes. No history of splenectomy.  PSYCHIATRIC: No history of depression or anxiety.  ENDOCRINOLOGIC: No reports of sweating, cold or heat intolerance. No polyuria or polydipsia.  Marland Kitchen   Physical Examination Today's Vitals   03/30/21 1545  BP: 136/72  Pulse: 77  SpO2: 99%  Weight: 136 lb 6.4 oz (61.9 kg)  Height: 5\' 3"  (1.6 m)   Body mass index is 24.16 kg/m.  Gen: resting comfortably, no acute distress HEENT: no scleral icterus, pupils equal round and reactive, no palptable cervical adenopathy,  CV: RRR, no m/r/g, no jvd Resp: Clear to auscultation bilaterally GI: abdomen is soft, non-tender, non-distended, normal bowel sounds, no  hepatosplenomegaly MSK: extremities are warm, no edema.  Skin: warm, no rash Neuro:  no focal deficits Psych: appropriate affect   Diagnostic Studies  Echo complete with bubble 03/27/21 INTERPRETATION --------------------------------------------------------------- NORMAL LEFT VENTRICULAR SYSTOLIC FUNCTION NORMAL LA PRESSURES WITH NORMAL DIASTOLIC FUNCTION NORMAL RIGHT VENTRICULAR SYSTOLIC FUNCTION VALVULAR REGURGITATION: TRIVIAL MR, TRIVIAL PR, TRIVIAL TR NO VALVULAR STENOSIS MINIMALLY LATE POSITIVE SALINE CONTRAST STUDY AFTER VALSALVA MOST CONSISTENT WITH EXTRACARDIAC SHUNTING. NO PRIOR STUDY FOR COMPARISON  CT head [facility]:  no acute procees   Assessment and Plan  1. Dizziness/Syncope - last visit symptoms were solely dizziness with some orthostatic component, we started florinef - since that time multiple syncopal episodes that are not orthostatic in nature. Occurs while laying flat in bed. Has associated prodrome, odd in that she reportedly is uresponsive for 10-15 minutes after - echo at [facility] recently benign - forutnatley she had a holter monitor on from [facility] during her Sunday episode, we are awaiting the report. If normal then no further cardiac workup and would defer to neuro     Antoine Poche, M.D.

## 2021-04-01 ENCOUNTER — Encounter: Payer: Self-pay | Admitting: *Deleted

## 2021-04-02 ENCOUNTER — Telehealth: Payer: Self-pay | Admitting: Cardiology

## 2021-04-02 NOTE — Telephone Encounter (Signed)
Monitor in Care Everywhere, not resulted. Verbalized to pt that we will call when it is resulted.

## 2021-04-02 NOTE — Telephone Encounter (Signed)
  Patient called wanting to get heart monitor results (Done at [facility]). She can see on her MY CHART.

## 2021-04-11 ENCOUNTER — Other Ambulatory Visit: Payer: Self-pay | Admitting: Family Medicine

## 2021-04-15 LAB — BASIC METABOLIC PANEL
BUN/Creatinine Ratio: 8 — ABNORMAL LOW (ref 9–23)
BUN: 5 mg/dL — ABNORMAL LOW (ref 6–20)
CO2: 22 mmol/L (ref 20–29)
Calcium: 9.6 mg/dL (ref 8.7–10.2)
Chloride: 101 mmol/L (ref 96–106)
Creatinine, Ser: 0.61 mg/dL (ref 0.57–1.00)
Glucose: 103 mg/dL — ABNORMAL HIGH (ref 65–99)
Potassium: 4.1 mmol/L (ref 3.5–5.2)
Sodium: 140 mmol/L (ref 134–144)
eGFR: 119 mL/min/{1.73_m2} (ref >59)

## 2021-04-15 LAB — VITAMIN D 25 HYDROXY (VIT D DEFICIENCY, FRACTURES): Vit D, 25-Hydroxy: 57.8 ng/mL (ref 30.0–100.0)

## 2021-04-15 LAB — MAGNESIUM: Magnesium: 2.5 mg/dL — ABNORMAL HIGH (ref 1.6–2.3)

## 2021-04-15 LAB — VITAMIN B12: Vitamin B-12: 707 pg/mL (ref 232–1245)

## 2021-04-28 ENCOUNTER — Ambulatory Visit (INDEPENDENT_AMBULATORY_CARE_PROVIDER_SITE_OTHER): Payer: 59 | Admitting: Adult Health

## 2021-04-28 ENCOUNTER — Other Ambulatory Visit: Payer: Self-pay

## 2021-04-28 ENCOUNTER — Encounter: Payer: Self-pay | Admitting: Adult Health

## 2021-04-28 VITALS — BP 103/66 | HR 71 | Ht 63.0 in | Wt 138.0 lb

## 2021-04-28 DIAGNOSIS — R42 Dizziness and giddiness: Secondary | ICD-10-CM | POA: Diagnosis not present

## 2021-04-28 DIAGNOSIS — G43109 Migraine with aura, not intractable, without status migrainosus: Secondary | ICD-10-CM | POA: Diagnosis not present

## 2021-04-28 NOTE — Patient Instructions (Signed)
Your Plan: ? ?Continue ajovy and nurtec ?If your symptoms worsen or you develop new symptoms please let us know.  ? ? ?Thank you for coming to see us at [name]. I hope we have been able to provide you high quality care today. ? ?You may receive a patient satisfaction survey over the next few weeks. We would appreciate your feedback and comments so that we may continue to improve ourselves and the health of our patients. ? ?

## 2021-04-28 NOTE — Progress Notes (Addendum)
PATIENT: Stacey Compton DOB: 10/31/1986  REASON FOR VISIT: follow up HISTORY FROM: patient Primary neurologist: Dr. Epimenio FootSater  HISTORY OF PRESENT ILLNESS: Today 04/28/21:  Ms. Stacey Compton is a 35 year old female with a history of migraine headaches.  She returns today for follow-up.  She reports in the last 2 weeks her dizziness has improved.  She is not sure why it has improved that she has not changed anything.  She did do vestibular rehab but did not find it helpful.  She has had a complete cardiac work-up that was relatively unremarkable.  She feels that her headaches have been stable.  She has not had to use Nurtec in over a month.  She returns today for follow-up.  03/30/21: Ms. Stacey Compton is a 35 year old female with a history of migraine headaches.  She returns today for follow-up.  The patient states that she has had dizziness has been persistent for the last 3 weeks.  She has a ongoing history of dizziness that occurs intermittently.  She states that some of her episodes has a true room spinning sensation while other times she feels like she is floating and off balance.  She states that when she lays down her symptoms improve but do not resolve.  She states the longer that she sits up the symptoms worsen.  She states that with severe episodes her head and hands feel heavy.  She is also been evaluated by her PCP who has completed blood work that has been relatively unremarkable.  Patient is awaiting a referral to cardiology.  She reports that her headache frequency has improved with Ajovy.  Although in the last 3 weeks the headaches seem to be slightly increased but she relates this to the multitude of other symptoms she is having at this time.  HISTORY 02/18/20:   Ms. Stacey Compton is a 35 year old female with a history of migraine headaches.  She returns today for follow-up.  She continues on Aimovig and Nurtec.  She reports that she has approximately 2 headaches a month but they may last 2 to 3 days.   She states that Nurtec does give her some benefit but does not resolve the headache.  She reports she has noticed recently that she will get changes in her peripheral vision bilaterally prior to her headache.  She describes it as a bug running across the room.  She returns today for an evaluation  REVIEW OF SYSTEMS: Out of a complete 14 system review of symptoms, the patient complains only of the following symptoms, and all other reviewed systems are negative.  See HPI  ALLERGIES: Allergies  Allergen Reactions   Hydrocodone Bit-Homatrop Mbr Nausea And Vomiting and Rash    Hycodan    Percocet [Oxycodone-Acetaminophen] Hives and Rash    HOME MEDICATIONS: Outpatient Medications Prior to Visit  Medication Sig Dispense Refill   citalopram (CELEXA) 20 MG tablet TAKE 1 TABLET(20 MG) BY MOUTH AT BEDTIME 30 tablet 4   clonazePAM (KLONOPIN) 0.5 MG tablet TAKE 1/2 TO 1 (ONE-HALF TO ONE) TABLET BY MOUTH TWICE DAILY AS NEEDED FOR ANXIETY 24 tablet 1   dicyclomine (BENTYL) 10 MG capsule Take 1 capsule (10 mg total) by mouth 4 (four) times daily -  before meals and at bedtime. (Patient taking differently: Take 10 mg by mouth 4 (four) times daily -  before meals and at bedtime. As needed) 120 capsule 5   Etonogestrel (NEXPLANON [place]) Inject 1 Device into the skin once.      famotidine (PEPCID) 40  MG tablet Take one tablet po every morning (Patient taking differently: Take 40 mg by mouth as needed. Take one tablet po every morning) 30 tablet 5   fludrocortisone (FLORINEF) 0.1 MG tablet Take 1 tablet (0.1 mg total) by mouth daily. 90 tablet 1   Fremanezumab-vfrm (AJOVY) 225 MG/1.5ML SOAJ Inject 225 mg into the skin every 30 (thirty) days. 1.68 mL 5   ORILISSA 150 MG TABS Take 150 mg by mouth at bedtime.     pantoprazole (PROTONIX) 40 MG tablet TAKE 1 TABLET(40 MG) BY MOUTH DAILY 30 tablet 5   Rimegepant Sulfate (NURTEC) 75 MG TBDP Take 75 mg by mouth daily as needed. Take at the onset of migraine.1 tablet/24  hours 8 tablet 5   Vitamin D, Ergocalciferol, (DRISDOL) 1.25 MG (50000 UNIT) CAPS capsule TAKE 1 CAPSULE BY MOUTH EVERY 7 DAYS 4 capsule 1   Facility-Administered Medications Prior to Visit  Medication Dose Route Frequency Provider Last Rate Last Admin   methylPREDNISolone acetate (DEPO-MEDROL) injection 40 mg  40 mg Intra-Lesional Once Merlyn Albert, MD        PAST MEDICAL HISTORY: Past Medical History:  Diagnosis Date   Anemia    Anxiety    B12 deficiency 02/10/2021   B12 low end of normal patient with significant fatigue will go ahead and treat April 2020   Depression    denies   Endometriosis    Hx of varicella    IBS (irritable bowel syndrome)    Infection    UTI   Migraines    with aura   PONV (postoperative nausea and vomiting)     PAST SURGICAL HISTORY: Past Surgical History:  Procedure Laterality Date   BIOPSY  09/11/2020   Procedure: BIOPSY;  Surgeon: Lanelle Bal, DO;  [facility];  Service: Endoscopy;;   CHOLECYSTECTOMY N/A 11/18/2019   Procedure: LAPAROSCOPIC CHOLECYSTECTOMY;  Surgeon: [name], MD;  Location: [facility];  Service: General;  Laterality: N/A;   COLONOSCOPY WITH PROPOFOL N/A 09/11/2020   Procedure: COLONOSCOPY WITH PROPOFOL;  Surgeon: Lanelle Bal, DO;  [facility];  Service: Endoscopy;  Laterality: N/A;  10:30am   ESOPHAGOGASTRODUODENOSCOPY (EGD) WITH PROPOFOL N/A 09/11/2020   Procedure: ESOPHAGOGASTRODUODENOSCOPY (EGD) WITH PROPOFOL;  Surgeon: Lanelle Bal, DO;  [facility];  Service: Endoscopy;  Laterality: N/A;   LAPAROSCOPY     with chromopertubation   OTHER SURGICAL HISTORY     dx lap for endometriosis    FAMILY HISTORY: Family History  Problem Relation Age of Onset   Fibromyalgia Mother    Diabetes Mother        borderline   Depression Father    Diabetes Father        type 2   Colon polyps Father        >23   Anxiety disorder Sister    Crohn's disease Sister    Bipolar  disorder Maternal Aunt    Cancer Maternal Grandmother        mouth and lung   Bipolar disorder Maternal Grandfather    Stroke Maternal Grandfather    Heart disease Paternal Grandfather    Colon cancer Cousin        maternal   Celiac disease Neg Hx     SOCIAL HISTORY: Social History   Socioeconomic History   Marital status: Married    Spouse name: Not on file   Number of children: Not on file   Years of education: Not on  file   Highest education level: Not on file  Occupational History   Not on file  Tobacco Use   Smoking status: Never   Smokeless tobacco: Never  Vaping Use   Vaping Use: Never used  Substance and Sexual Activity   Alcohol use: No   Drug use: No   Sexual activity: Yes    Birth control/protection: None  Other Topics Concern   Not on file  Social History Narrative   Not on file   Social Determinants of Health   Financial Resource Strain: Not on file  Food Insecurity: Not on file  Transportation Needs: Not on file  Physical Activity: Not on file  Stress: Not on file  Social Connections: Not on file  Intimate Partner Violence: Not on file      PHYSICAL EXAM  Vitals:   04/28/21 1427  BP: 103/66  Pulse: 71  Weight: 138 lb (62.6 kg)  Height: 5\' 3"  (1.6 m)   Body mass index is 24.45 kg/m.  Generalized: Well developed, in no acute distress   Neurological examination  Mentation: Alert oriented to time, place, history taking. Follows all commands speech and language fluent Cranial nerve II-XII: Pupils were equal round reactive to light. Extraocular movements were full, visual field were full on confrontational test.   Head turning and shoulder shrug  were normal and symmetric.   Motor: The motor testing reveals 5 over 5 strength of all 4 extremities. Good symmetric motor tone is noted throughout.  Sensory: Sensory testing is intact to soft touch on all 4 extremities. No evidence of extinction is noted.  Coordination: Cerebellar testing reveals  good finger-nose-finger and heel-to-shin bilaterally.  Gait and station: Gait is normal Reflexes: Deep tendon reflexes are symmetric and normal bilaterally.   DIAGNOSTIC DATA (LABS, IMAGING, TESTING) - I reviewed patient records, labs, notes, testing and imaging myself where available.  Lab Results  Component Value Date   WBC 7.4 12/04/2020   HGB 14.1 12/04/2020   HCT 42.2 12/04/2020   MCV 95 12/04/2020   PLT 285 12/04/2020      Component Value Date/Time   NA 140 04/14/2021 1340   K 4.1 04/14/2021 1340   CL 101 04/14/2021 1340   CO2 22 04/14/2021 1340   GLUCOSE 103 (H) 04/14/2021 1340   GLUCOSE 105 (H) 10/23/2019 1730   BUN 5 (L) 04/14/2021 1340   CREATININE 0.61 04/14/2021 1340   CALCIUM 9.6 04/14/2021 1340   PROT 7.6 12/04/2020 1138   ALBUMIN 4.7 12/04/2020 1138   AST 15 12/04/2020 1138   ALT 11 12/04/2020 1138   ALKPHOS 51 12/04/2020 1138   BILITOT 0.5 12/04/2020 1138   GFRNONAA 113 12/04/2020 1138   GFRAA 131 12/04/2020 1138    Lab Results  Component Value Date   TSH 1.600 12/04/2020      ASSESSMENT AND PLAN 35 y.o. year old female  has a past medical history of Anemia, Anxiety, B12 deficiency (02/10/2021), Depression, Endometriosis, varicella, IBS (irritable bowel syndrome), Infection, Migraines, and PONV (postoperative nausea and vomiting). here with:  1.  Migraine headaches   -Continue Ajovy for headache prevention  -Continue Nurtec as abortive therapy -Advised if her headache frequency or severity worsen she should let 04/12/2021 know  2.  Dizziness  -- Improved in the last 2 weeks --Does not feel vestibular rehab was helpful --We will continue to monitor  -Follow-up in 6 months or sooner if needed    [place], MSN, NP-C 04/28/2021, 2:43 PM [name] [phone], [address] [place], [place] [address] [phone]   I have read the note, and I agree with the clinical assessment and plan.  Richard A. Epimenio Foot, MD, PhD,  [facility] Certified in Neurology, Clinical Neurophysiology, Sleep Medicine, Pain Medicine and Neuroimaging  [facility] [address], [address] [place], [place] [address] [phone]

## 2021-05-06 ENCOUNTER — Ambulatory Visit: Payer: 59 | Admitting: Internal Medicine

## 2021-05-18 ENCOUNTER — Telehealth: Payer: Self-pay | Admitting: *Deleted

## 2021-05-18 ENCOUNTER — Telehealth: Payer: 59 | Admitting: Physician Assistant

## 2021-05-18 DIAGNOSIS — U071 COVID-19: Secondary | ICD-10-CM

## 2021-05-18 MED ORDER — BENZONATATE 100 MG PO CAPS: 100.0000 mg | ORAL_CAPSULE | Freq: Three times a day (TID) | ORAL | 0 refills | Status: DC | PRN

## 2021-05-18 NOTE — Telephone Encounter (Signed)
The request has been approved. The authorization is effective for a maximum of 12 fills from 05/18/2021 to 05/17/2022, as long as the member is enrolled in their current health plan. This has been approved for a quantity limit of 18 with a day supply limit of 30. A written notification letter will follow with additional details.  Updated pharmacy via fax. Received a receipt of confirmation.

## 2021-05-18 NOTE — Progress Notes (Signed)
I have spent 5 minutes in review of e-visit questionnaire, review and updating patient chart, medical decision making and response to patient.   Concepcion Gillott Cody Tacia Hindley, PA-C    

## 2021-05-18 NOTE — Progress Notes (Signed)

## 2021-05-18 NOTE — Telephone Encounter (Signed)
Completed Nurtec PA on Cover My Meds. Key: [id number]. Awaiting determination from [employer].

## 2021-05-23 ENCOUNTER — Telehealth: Payer: 59 | Admitting: Physician Assistant

## 2021-05-23 DIAGNOSIS — U071 COVID-19: Secondary | ICD-10-CM

## 2021-05-23 DIAGNOSIS — R059 Cough, unspecified: Secondary | ICD-10-CM

## 2021-05-23 MED ORDER — PROMETHAZINE-DM 6.25-15 MG/5ML PO SYRP: 5.0000 mL | ORAL_SOLUTION | Freq: Four times a day (QID) | ORAL | 0 refills | Status: DC | PRN

## 2021-05-23 MED ORDER — ALBUTEROL SULFATE HFA 108 (90 BASE) MCG/ACT IN AERS: 2.0000 | INHALATION_SPRAY | Freq: Four times a day (QID) | RESPIRATORY_TRACT | 0 refills | Status: DC | PRN

## 2021-05-23 NOTE — Progress Notes (Signed)
    E-Visit  for Positive Covid Test Result  We are sorry you are not feeling well. We are here to help!   Please continue isolation at home, for at least 10 days since the start of your symptoms and until you have had 24 hours with no fever (without taking a fever reducer) and with improving of symptoms.  If you have no symptoms but tested positive (or all symptoms resolve after 5 days and you have no fever) you can leave your house but continue to wear a mask around others for an additional 5 days. If you have a fever,continue to stay home until you have had 24 hours of no fever. Most cases improve 5-10 days from onset but we have seen a small number of patients who have gotten worse after the 10 days.  Please be sure to watch for worsening symptoms and remain taking the proper precautions.   Giving worsening cough despite tessalon perles I am going to have you stop those. I am started you on a combination of a prescription cough syrup and an albuterol inhaler to help calm down spasm in the airways causing cough.   Go to the nearest hospital ED for assessment if fever/cough/breathlessness are severe or illness seems like a threat to life.    The following symptoms may appear 2-14 days after exposure: Fever Cough Shortness of breath or difficulty breathing Chills Repeated shaking with chills Muscle pain Headache Sore throat New loss of taste or smell Fatigue Congestion or runny nose Nausea or vomiting Diarrhea  You have been enrolled in MyChart Home Monitoring for COVID-19. Daily you will receive a questionnaire within the MyChart website. Our COVID-19 response team will be monitoring your responses daily.    You may also take acetaminophen (Tylenol) as needed for fever.  HOME CARE: Only take medications as instructed by your medical team. Drink plenty of fluids and get plenty of rest. A steam or ultrasonic humidifier can help if you have congestion.   GET HELP RIGHT AWAY IF  YOU HAVE EMERGENCY WARNING SIGNS.  Call 911 or proceed to your closest emergency facility if: You develop worsening high fever. Trouble breathing Bluish lips or face Persistent pain or pressure in the chest New confusion Inability to wake or stay awake You cough up blood. Your symptoms become more severe Inability to hold down food or fluids  This list is not all possible symptoms. Contact your medical provider for any symptoms that are severe or concerning to you.    Your e-visit answers were reviewed by a board certified advanced clinical practitioner to complete your personal care plan.  Depending on the condition, your plan could have included both over the counter or prescription medications.  If there is a problem please reply once you have received a response from your provider.  Your safety is important to [place].  If you have drug allergies check your prescription carefully.    You can use MyChart to ask questions about today's visit, request a non-urgent call back, or ask for a work or school excuse for 24 hours related to this e-Visit. If it has been greater than 24 hours you will need to follow up with your provider, or enter a new e-Visit to address those concerns. You will get an e-mail in the next two days asking about your experience.  I hope that your e-visit has been valuable and will speed your recovery. Thank you for using e-visits.

## 2021-05-23 NOTE — Progress Notes (Signed)
I have spent 5 minutes in review of e-visit questionnaire, review and updating patient chart, medical decision making and response to patient.   Dejon Jungman Cody Ryleeann Urquiza, PA-C    

## 2021-06-07 ENCOUNTER — Telehealth: Payer: 59 | Admitting: Physician Assistant

## 2021-06-07 DIAGNOSIS — R3989 Other symptoms and signs involving the genitourinary system: Secondary | ICD-10-CM

## 2021-06-07 MED ORDER — CEPHALEXIN 500 MG PO CAPS: 500.0000 mg | ORAL_CAPSULE | Freq: Two times a day (BID) | ORAL | 0 refills | Status: DC

## 2021-06-07 NOTE — Progress Notes (Signed)

## 2021-07-01 ENCOUNTER — Other Ambulatory Visit: Payer: Self-pay | Admitting: Cardiology

## 2021-08-09 ENCOUNTER — Other Ambulatory Visit: Payer: Self-pay | Admitting: Family Medicine

## 2021-09-13 ENCOUNTER — Other Ambulatory Visit: Payer: Self-pay | Admitting: Family Medicine

## 2021-09-14 NOTE — Telephone Encounter (Signed)
Sent my chart message to schedule appointment 09/14/2021

## 2021-09-17 ENCOUNTER — Other Ambulatory Visit: Payer: Self-pay | Admitting: Family Medicine

## 2021-09-17 ENCOUNTER — Encounter: Payer: Self-pay | Admitting: Family Medicine

## 2021-09-17 MED ORDER — CITALOPRAM HYDROBROMIDE 20 MG PO TABS: ORAL_TABLET | ORAL | 2 refills | Status: AC

## 2021-09-17 NOTE — Telephone Encounter (Signed)
Patient scheduled appointment on 10/11/21 needs refill called into if possible she/her/hers states completely out of medication

## 2021-09-28 ENCOUNTER — Other Ambulatory Visit: Payer: Self-pay | Admitting: Cardiology

## 2021-10-11 ENCOUNTER — Ambulatory Visit: Payer: 59 | Admitting: Family Medicine

## 2021-10-28 ENCOUNTER — Ambulatory Visit (INDEPENDENT_AMBULATORY_CARE_PROVIDER_SITE_OTHER): Payer: 59 | Admitting: Adult Health

## 2021-10-28 ENCOUNTER — Encounter: Payer: Self-pay | Admitting: Adult Health

## 2021-10-28 VITALS — BP 97/65 | HR 69 | Ht 63.0 in | Wt 147.4 lb

## 2021-10-28 DIAGNOSIS — G43109 Migraine with aura, not intractable, without status migrainosus: Secondary | ICD-10-CM | POA: Diagnosis not present

## 2021-10-28 NOTE — Patient Instructions (Signed)
Your Plan:  Continue Nurtec for abortive therapy If your symptoms worsen or you develop new symptoms please let us know.     Thank you for coming to see [place] at [facility]. I hope we have been able to provide you high quality care today.  You may receive a patient satisfaction survey over the next few weeks. We would appreciate your feedback and comments so that we may continue to improve ourselves and the health of our patients.

## 2021-10-28 NOTE — Progress Notes (Signed)
PATIENT: Stacey Compton DOB: 1986-10-01  REASON FOR VISIT: follow up HISTORY FROM: patient Primary neurologist: Dr. Felecia Shelling  HISTORY OF PRESENT ILLNESS: Today 10/28/21:  Stacey Compton is a 35 year old female with a history of migraine headaches.  She returns today for follow-up.  She reports that her headaches have been under relatively good control.  She did stop Ajovy 3 months ago.  She states that she did forget a dose and then she felt that it may be contributing to some of her stomach issues.  She states that since she has been off the medication she has not seen an increase in her headaches.  She has approximately 1-2 headaches a week.  They typically resolve in 45 minutes to 1 hour with Nurtec.  She returns today for evaluation.  04/28/21: Stacey Compton is a 35 year old female with a history of migraine headaches.  She returns today for follow-up.  She reports in the last 2 weeks her dizziness has improved.  She is not sure why it has improved that she has not changed anything.  She did do vestibular rehab but did not find it helpful.  She has had a complete cardiac work-up that was relatively unremarkable.  She feels that her headaches have been stable.  She has not had to use Nurtec in over a month.  She returns today for follow-up.  03/30/21: Stacey Compton is a 35 year old female with a history of migraine headaches.  She returns today for follow-up.  The patient states that she has had dizziness has been persistent for the last 3 weeks.  She has a ongoing history of dizziness that occurs intermittently.  She states that some of her episodes has a true room spinning sensation while other times she feels like she is floating and off balance.  She states that when she lays down her symptoms improve but do not resolve.  She states the longer that she sits up the symptoms worsen.  She states that with severe episodes her head and hands feel heavy.  She is also been evaluated by her PCP who has completed  blood work that has been relatively unremarkable.  Patient is awaiting a referral to cardiology.  She reports that her headache frequency has improved with Ajovy.  Although in the last 3 weeks the headaches seem to be slightly increased but she relates this to the multitude of other symptoms she is having at this time.  HISTORY 02/18/20:   Stacey Compton is a 35 year old female with a history of migraine headaches.  She returns today for follow-up.  She continues on Aimovig and Nurtec.  She reports that she has approximately 2 headaches a month but they may last 2 to 3 days.  She states that Nurtec does give her some benefit but does not resolve the headache.  She reports she has noticed recently that she will get changes in her peripheral vision bilaterally prior to her headache.  She describes it as a bug running across the room.  She returns today for an evaluation  REVIEW OF SYSTEMS: Out of a complete 14 system review of symptoms, the patient complains only of the following symptoms, and all other reviewed systems are negative.  See HPI  ALLERGIES: Allergies  Allergen Reactions   Hydrocodone Bit-Homatrop Mbr Nausea And Vomiting and Rash    Hycodan    Percocet [Oxycodone-Acetaminophen] Hives and Rash    HOME MEDICATIONS: Outpatient Medications Prior to Visit  Medication Sig Dispense Refill   albuterol (VENTOLIN  HFA) 108 (90 Base) MCG/ACT inhaler Inhale 2 puffs into the lungs every 6 (six) hours as needed for wheezing or shortness of breath. 8 g 0   citalopram (CELEXA) 20 MG tablet TAKE 1 TABLET(20 MG) BY MOUTH AT BEDTIME 30 tablet 2   clonazePAM (KLONOPIN) 0.5 MG tablet TAKE 1/2 TO 1 (ONE-HALF TO ONE) TABLET BY MOUTH TWICE DAILY AS NEEDED FOR ANXIETY 24 tablet 1   Etonogestrel (NEXPLANON [place]) Inject 1 Device into the skin once.      fludrocortisone (FLORINEF) 0.1 MG tablet Take 1 tablet by mouth once daily 90 tablet 1   ORILISSA 150 MG TABS Take 150 mg by mouth at bedtime.     pantoprazole  (PROTONIX) 40 MG tablet TAKE 1 TABLET(40 MG) BY MOUTH DAILY 30 tablet 5   Rimegepant Sulfate (NURTEC) 75 MG TBDP Take 75 mg by mouth daily as needed. Take at the onset of migraine.1 tablet/24 hours 8 tablet 5   benzonatate (TESSALON) 100 MG capsule Take 1 capsule (100 mg total) by mouth 3 (three) times daily as needed for cough. 30 capsule 0   cephALEXin (KEFLEX) 500 MG capsule Take 1 capsule (500 mg total) by mouth 2 (two) times daily. 14 capsule 0   dicyclomine (BENTYL) 10 MG capsule Take 1 capsule (10 mg total) by mouth 4 (four) times daily -  before meals and at bedtime. (Patient taking differently: Take 10 mg by mouth 4 (four) times daily -  before meals and at bedtime. As needed) 120 capsule 5   famotidine (PEPCID) 40 MG tablet Take one tablet po every morning (Patient taking differently: Take 40 mg by mouth as needed. Take one tablet po every morning) 30 tablet 5   Fremanezumab-vfrm (AJOVY) 225 MG/1.5ML SOAJ Inject 225 mg into the skin every 30 (thirty) days. 1.68 mL 5   promethazine-dextromethorphan (PROMETHAZINE-DM) 6.25-15 MG/5ML syrup Take 5 mLs by mouth 4 (four) times daily as needed for cough. 118 mL 0   Vitamin D, Ergocalciferol, (DRISDOL) 1.25 MG (50000 UNIT) CAPS capsule TAKE 1 CAPSULE BY MOUTH EVERY 7 DAYS 4 capsule 1   Facility-Administered Medications Prior to Visit  Medication Dose Route Frequency Provider Last Rate Last Admin   methylPREDNISolone acetate (DEPO-MEDROL) injection 40 mg  40 mg Intra-Lesional Once Mikey Kirschner, MD        PAST MEDICAL HISTORY: Past Medical History:  Diagnosis Date   Anemia    Anxiety    B12 deficiency 02/10/2021   B12 low end of normal patient with significant fatigue will go ahead and treat April 2020   Depression    denies   Endometriosis    Hx of varicella    IBS (irritable bowel syndrome)    Infection    UTI   Migraines    with aura   Migraines    PONV (postoperative nausea and vomiting)     PAST SURGICAL HISTORY: Past  Surgical History:  Procedure Laterality Date   BIOPSY  09/11/2020   Procedure: BIOPSY;  Surgeon: Eloise Harman, DO;  [facility];  Service: Endoscopy;;   CHOLECYSTECTOMY N/A 11/18/2019   Procedure: LAPAROSCOPIC CHOLECYSTECTOMY;  Surgeon: Virl Cagey, MD;  Location: [facility];  Service: General;  Laterality: N/A;   COLONOSCOPY WITH PROPOFOL N/A 09/11/2020   Procedure: COLONOSCOPY WITH PROPOFOL;  Surgeon: Eloise Harman, DO;  [facility];  Service: Endoscopy;  Laterality: N/A;  10:30am   ESOPHAGOGASTRODUODENOSCOPY (EGD) WITH PROPOFOL N/A 09/11/2020   Procedure: ESOPHAGOGASTRODUODENOSCOPY (EGD) WITH  PROPOFOL;  Surgeon: Eloise Harman, DO;  [facility];  Service: Endoscopy;  Laterality: N/A;   LAPAROSCOPY     with chromopertubation   OTHER SURGICAL HISTORY     dx lap for endometriosis    FAMILY HISTORY: Family History  Problem Relation Age of Onset   Fibromyalgia Mother    Diabetes Mother        borderline   Depression Father    Diabetes Father        type 2   Colon polyps Father        >82   Anxiety disorder Sister    Crohn's disease Sister    Bipolar disorder Maternal Aunt    Cancer Maternal Grandmother        mouth and lung   Bipolar disorder Maternal Grandfather    Stroke Maternal Grandfather    Migraines Maternal Grandfather    Heart disease Paternal Grandfather    Colon cancer Cousin        maternal   Celiac disease Neg Hx     SOCIAL HISTORY: Social History   Socioeconomic History   Marital status: Married    Spouse name: Not on file   Number of children: Not on file   Years of education: Not on file   Highest education level: Not on file  Occupational History   Not on file  Tobacco Use   Smoking status: Never   Smokeless tobacco: Never  Vaping Use   Vaping Use: Never used  Substance and Sexual Activity   Alcohol use: No   Drug use: No   Sexual activity: Yes    Birth control/protection: None  Other Topics  Concern   Not on file  Social History Narrative   Not on file   Social Determinants of Health   Financial Resource Strain: Not on file  Food Insecurity: Not on file  Transportation Needs: Not on file  Physical Activity: Not on file  Stress: Not on file  Social Connections: Not on file  Intimate Partner Violence: Not on file      PHYSICAL EXAM  Vitals:   10/28/21 1427  BP: 97/65  Pulse: 69  Weight: 147 lb 6.4 oz (66.9 kg)  Height: 5\' 3"  (1.6 m)   Body mass index is 26.11 kg/m.  Generalized: Well developed, in no acute distress   Neurological examination  Mentation: Alert oriented to time, place, history taking. Follows all commands speech and language fluent Cranial nerve II-XII: Pupils were equal round reactive to light. Extraocular movements were full, visual field were full on confrontational test.   Head turning and shoulder shrug  were normal and symmetric.   Motor: The motor testing reveals 5 over 5 strength of all 4 extremities. Good symmetric motor tone is noted throughout.  Sensory: Sensory testing is intact to soft touch on all 4 extremities. No evidence of extinction is noted.  Coordination: Cerebellar testing reveals good finger-nose-finger and heel-to-shin bilaterally.  Gait and station: Gait is normal Reflexes: Deep tendon reflexes are symmetric and normal bilaterally.   DIAGNOSTIC DATA (LABS, IMAGING, TESTING) - I reviewed patient records, labs, notes, testing and imaging myself where available.  Lab Results  Component Value Date   WBC 7.4 12/04/2020   HGB 14.1 12/04/2020   HCT 42.2 12/04/2020   MCV 95 12/04/2020   PLT 285 12/04/2020      Component Value Date/Time   NA 140 04/14/2021 1340   K 4.1 04/14/2021 1340   CL 101 04/14/2021  1340   CO2 22 04/14/2021 1340   GLUCOSE 103 (H) 04/14/2021 1340   GLUCOSE 105 (H) 10/23/2019 1730   BUN 5 (L) 04/14/2021 1340   CREATININE 0.61 04/14/2021 1340   CALCIUM 9.6 04/14/2021 1340   PROT 7.6 12/04/2020  1138   ALBUMIN 4.7 12/04/2020 1138   AST 15 12/04/2020 1138   ALT 11 12/04/2020 1138   ALKPHOS 51 12/04/2020 1138   BILITOT 0.5 12/04/2020 1138   GFRNONAA 113 12/04/2020 1138   GFRAA 131 12/04/2020 1138    Lab Results  Component Value Date   TSH 1.600 12/04/2020      ASSESSMENT AND PLAN 35 y.o. year old female  has a past medical history of Anemia, Anxiety, B12 deficiency (02/10/2021), Depression, Endometriosis, varicella, IBS (irritable bowel syndrome), Infection, Migraines, Migraines, and PONV (postoperative nausea and vomiting). here with:  1.  Migraine headaches  -Continue Nurtec as abortive therapy -Advised if her headache frequency or severity worsen she should let us know.  We can consider a preventative medication in the future if needed   -Follow-up in 6 months or sooner if needed    Butch Penny, MSN, NP-C 10/28/2021, 2:40 PM [facility] [address] [place], [place] [address] [phone]

## 2021-11-11 ENCOUNTER — Other Ambulatory Visit: Payer: Self-pay | Admitting: Adult Health

## 2021-11-20 ENCOUNTER — Encounter: Payer: Self-pay | Admitting: Adult Health

## 2021-11-22 ENCOUNTER — Telehealth: Payer: Self-pay | Admitting: *Deleted

## 2021-11-22 NOTE — Telephone Encounter (Signed)
Visual merchandiser (Key: [id number]) Rx #: [id number] Nurtec 75MG  dispersible tablets  Sent PA for Nurtec waiting on approval

## 2021-11-23 NOTE — Telephone Encounter (Signed)
The request for coverage for NURTEC TAB 75MG  ODT, use as directed (8 per month), is denied. This decision is based on health plan criteria for NURTEC TAB 75MG  ODT. This medicine is covered only if:  One of the following: (I) You are currently treated with one of the following prophylactic therapies: (a) Amitriptyline (Elavil). (b) A beta-blocker (that is, atenolol, metoprolol, nadolol, propranolol, or timolol). (c) A biologic calcitonin gene-related peptide receptor antagonist for preventive treatment of migraine (that is, Aimovig, Ajovy, Emgality, Vyepti)*. (d) Divalproex sodium (Depakote/Depakote ER). (e) OnabotulinumtoxinA (Botox)*. (f) Topiramate (Topamax). (g) Venlafaxine (Effexor/Effexor XR). (h) Candesartan (Atacand).  (II) You have 4 or more migraine days per month and have a contraindication or intolerance to one of the following prophylactic therapies: (a) Amitriptyline (Elavil). (b) A beta-blocker (that is, atenolol, metoprolol, nadolol, propranolol, or timolol). (c) A biologic calcitonin gene-related peptide receptor antagonist for preventive treatment of migraine (that is, Aimovig, Ajovy, Emgality, Vyepti)*. (d) Divalproex sodium (Depakote/Depakote ER). (e) OnabotulinumtoxinA (Botox)*. (f) Topiramate (Topamax). (g) Venlafaxine (Effexor/Effexor XR). (h) Candesartan (Atacand). The information provided does not show that you meet the criteria listed above.  Reviewed by: .Ph.

## 2021-11-24 ENCOUNTER — Ambulatory Visit (INDEPENDENT_AMBULATORY_CARE_PROVIDER_SITE_OTHER): Payer: 59 | Admitting: Internal Medicine

## 2021-11-24 ENCOUNTER — Other Ambulatory Visit: Payer: Self-pay

## 2021-11-24 ENCOUNTER — Encounter: Payer: Self-pay | Admitting: Internal Medicine

## 2021-11-24 VITALS — BP 107/90 | HR 108 | Temp 97.7°F | Ht 63.0 in | Wt 144.2 lb

## 2021-11-24 DIAGNOSIS — K219 Gastro-esophageal reflux disease without esophagitis: Secondary | ICD-10-CM | POA: Diagnosis not present

## 2021-11-24 DIAGNOSIS — R1031 Right lower quadrant pain: Secondary | ICD-10-CM | POA: Diagnosis not present

## 2021-11-24 NOTE — Patient Instructions (Signed)
I am not sure what is causing your abdominal pain.  This may not be GI related.  I will reach out to the general surgeon here locally to discuss your CT scan further.  In the meantime I want you to start taking dicyclomine to see if this helps with your pain.  If you need refills then let us know.  I would also recommend that you reach out to gynecology as this may be a GYN issue.  You may need pelvic ultrasound to further evaluate.  It was very nice seeing you again today.  Dr. Marletta Lor  At [facility] we value your feedback. You may receive a survey about your visit today. Please share your experience as we strive to create trusting relationships with our patients to provide genuine, compassionate, quality care.  We appreciate your understanding and patience as we review any laboratory studies, imaging, and other diagnostic tests that are ordered as we care for you. Our office policy is 5 business days for review of these results, and any emergent or urgent results are addressed in a timely manner for your best interest. If you do not hear from our office in 1 week, please contact us.   We also encourage the use of MyChart, which contains your medical information for your review as well. If you are not enrolled in this feature, an access code is on this after visit summary for your convenience. Thank you for allowing [place] to be involved in your care.  It was great to see you today!  I hope you have a great rest of your Winter!    Hennie Duos. Marletta Lor, D.O. Gastroenterology and Hepatology [facility]

## 2021-11-24 NOTE — Progress Notes (Signed)
Referring Provider: Babs Sciara, MD Primary Care Physician:  Shelby Dubin, FNP Primary GI:  Dr. Marletta Lor  Chief Complaint  Patient presents with   Abdominal Pain    RUQ, RLQ pain, worse x 2 weeks, constant, sharp, stabbing. Radiates into back also. PCP ordered CT at [facility] 11/16/21   Nausea    1st week of abd pain, but none since    HPI:   Stacey Compton is a 36 y.o. female who presents to the clinic today for follow up visit. Previously seen for epigastric pain GERD, nausea, chronic diarrhea.    She underwent EGD which showed gastritis and peptic duodenitis, negative for H. pylori.  Colonoscopy unremarkable with negative random colon biopsies.     GERD well-controlled on pantoprazole.  Diarrhea has resolved.  Her main complaint for me today is new onset right lower quadrant pain which started approximately 2 weeks ago.  States this was intermittent at first, but now constant.  Severe at times.  Unable to sleep last night.  CT abdomen pelvis with contrast showed stool throughout the colon as well as appendicolith.  She underwent aggressive bowel regimen for 3 to 4 days with washout.  No improvement in her abdominal pain.  Now having bowel movements daily and pain is getting worse.  No melena hematochezia.  Past Medical History:  Diagnosis Date   Anemia    Anxiety    B12 deficiency 02/10/2021   B12 low end of normal patient with significant fatigue will go ahead and treat April 2020   Depression    denies   Endometriosis    Hx of varicella    IBS (irritable bowel syndrome)    Infection    UTI   Migraines    with aura   Migraines    PONV (postoperative nausea and vomiting)     Past Surgical History:  Procedure Laterality Date   BIOPSY  09/11/2020   Procedure: BIOPSY;  Surgeon: Lanelle Bal, DO;  [facility];  Service: Endoscopy;;   CHOLECYSTECTOMY N/A 11/18/2019   Procedure: LAPAROSCOPIC CHOLECYSTECTOMY;  Surgeon: [name], MD;   Location: [facility];  Service: General;  Laterality: N/A;   COLONOSCOPY WITH PROPOFOL N/A 09/11/2020   Procedure: COLONOSCOPY WITH PROPOFOL;  Surgeon: Lanelle Bal, DO;  [facility];  Service: Endoscopy;  Laterality: N/A;  10:30am   ESOPHAGOGASTRODUODENOSCOPY (EGD) WITH PROPOFOL N/A 09/11/2020   Procedure: ESOPHAGOGASTRODUODENOSCOPY (EGD) WITH PROPOFOL;  Surgeon: Lanelle Bal, DO;  [facility];  Service: Endoscopy;  Laterality: N/A;   LAPAROSCOPY     with chromopertubation   OTHER SURGICAL HISTORY     dx lap for endometriosis    Current Outpatient Medications  Medication Sig Dispense Refill   albuterol (VENTOLIN HFA) 108 (90 Base) MCG/ACT inhaler Inhale 2 puffs into the lungs every 6 (six) hours as needed for wheezing or shortness of breath. 8 g 0   citalopram (CELEXA) 20 MG tablet TAKE 1 TABLET(20 MG) BY MOUTH AT BEDTIME 30 tablet 2   clonazePAM (KLONOPIN) 0.5 MG tablet TAKE 1/2 TO 1 (ONE-HALF TO ONE) TABLET BY MOUTH TWICE DAILY AS NEEDED FOR ANXIETY 24 tablet 1   cyclobenzaprine (FLEXERIL) 10 MG tablet Take 10 mg by mouth at bedtime as needed.     dicyclomine (BENTYL) 10 MG capsule Take 1 capsule (10 mg total) by mouth 4 (four) times daily -  before meals and at bedtime. (Patient taking differently: Take 10 mg by  mouth 4 (four) times daily -  before meals and at bedtime. As needed) 120 capsule 5   Etonogestrel (NEXPLANON [place]) Inject 1 Device into the skin once.      fludrocortisone (FLORINEF) 0.1 MG tablet Take 1 tablet by mouth once daily 90 tablet 1   NURTEC 75 MG TBDP TAKE 1 TABLET BY MOUTH AT ONSET OF MIGRAINE AND MAY REPEAT 1 TABLET IN 24 HOUR 8 tablet 11   ORILISSA 150 MG TABS Take 150 mg by mouth at bedtime.     pantoprazole (PROTONIX) 40 MG tablet TAKE 1 TABLET(40 MG) BY MOUTH DAILY 30 tablet 5   Current Facility-Administered Medications  Medication Dose Route Frequency Provider Last Rate Last Admin   methylPREDNISolone acetate (DEPO-MEDROL) injection  40 mg  40 mg Intra-Lesional Once Mikey Kirschner, MD        Allergies as of 11/24/2021 - Review Complete 11/24/2021  Allergen Reaction Noted   Hydrocodone bit-homatrop mbr Nausea And Vomiting and Rash 09/03/2015   Percocet [oxycodone-acetaminophen] Hives and Rash 10/02/2013    Family History  Problem Relation Age of Onset   Fibromyalgia Mother    Diabetes Mother        borderline   Depression Father    Diabetes Father        type 2   Colon polyps Father        >60   Anxiety disorder Sister    Crohn's disease Sister    Bipolar disorder Maternal Aunt    Cancer Maternal Grandmother        mouth and lung   Bipolar disorder Maternal Grandfather    Stroke Maternal Grandfather    Migraines Maternal Grandfather    Heart disease Paternal Grandfather    Colon cancer Cousin        maternal   Celiac disease Neg Hx     Social History   Socioeconomic History   Marital status: Married    Spouse name: Not on file   Number of children: Not on file   Years of education: Not on file   Highest education level: Not on file  Occupational History   Not on file  Tobacco Use   Smoking status: Never   Smokeless tobacco: Never  Vaping Use   Vaping Use: Never used  Substance and Sexual Activity   Alcohol use: No   Drug use: No   Sexual activity: Yes    Birth control/protection: None  Other Topics Concern   Not on file  Social History Narrative   Not on file   Social Determinants of Health   Financial Resource Strain: Not on file  Food Insecurity: Not on file  Transportation Needs: Not on file  Physical Activity: Not on file  Stress: Not on file  Social Connections: Not on file    Subjective: Review of Systems  Constitutional:  Positive for malaise/fatigue. Negative for chills and fever.  HENT:  Negative for congestion and hearing loss.   Eyes:  Negative for blurred vision and double vision.  Respiratory:  Negative for cough and shortness of breath.   Cardiovascular:   Negative for chest pain and palpitations.  Gastrointestinal:  Positive for abdominal pain, diarrhea and nausea. Negative for blood in stool, constipation, heartburn, melena and vomiting.       Bloating  Genitourinary:  Negative for dysuria and urgency.  Musculoskeletal:  Negative for joint pain and myalgias.  Skin:  Negative for itching and rash.  Neurological:  Negative for dizziness and headaches.  Psychiatric/Behavioral:  Negative for depression. The patient is not nervous/anxious.     Objective: BP 107/90    Pulse (!) 108    Temp 97.7 F (36.5 C)    Ht 5\' 3"  (1.6 m)    Wt 144 lb 3.2 oz (65.4 kg)    BMI 25.54 kg/m  Physical Exam Constitutional:      Appearance: Normal appearance.  HENT:     Head: Normocephalic and atraumatic.  Eyes:     Extraocular Movements: Extraocular movements intact.     Conjunctiva/sclera: Conjunctivae normal.  Cardiovascular:     Rate and Rhythm: Normal rate and regular rhythm.  Pulmonary:     Effort: Pulmonary effort is normal.     Breath sounds: Normal breath sounds.  Abdominal:     General: Bowel sounds are normal.     Palpations: Abdomen is soft.  Musculoskeletal:        General: No swelling. Normal range of motion.     Cervical back: Normal range of motion and neck supple.  Skin:    General: Skin is warm and dry.     Coloration: Skin is not jaundiced.  Neurological:     General: No focal deficit present.     Mental Status: She is alert and oriented to person, place, and time.  Psychiatric:        Mood and Affect: Mood normal.        Behavior: Behavior normal.     Assessment: *GERD-improved on pantoprazole *Right lower quadrant abdominal pain-new, worsening   Plan: Patient's upper GI symptoms including GERD is improved on pantoprazole.  We will continue for now.  Biopsies negative for H. pylori.  Avoid NSAIDs.  Etiology of her new onset right lower quadrant pain unclear.  Not improved after aggressive bowel regimen.  Having daily  bowel movements.  I discussed case with Dr. Constance Haw given the appendicolith seen on CT imaging.  No active stranding indicative of appendicitis.  I have recommended patient see gynecology to rule out potential gynecological cause of her pain.  If this work-up is negative, will refer to Dr. Constance Haw to discuss potential elective appendectomy.  I have also recommended patient start taking dicyclomine to see if this helps with her pain  11/24/2021 2:58 PM   Disclaimer: This note was dictated with voice recognition software. Similar sounding words can inadvertently be transcribed and may not be corrected upon review.

## 2021-11-26 ENCOUNTER — Telehealth: Payer: Self-pay

## 2021-11-26 NOTE — Telephone Encounter (Signed)
FYI:   Dr. Marletta Lor, The pt phoned and LMOVM that she has gotten in contact with her OBGYN as recommended. If you have any questions to please call [phone].

## 2021-11-29 ENCOUNTER — Encounter: Payer: Self-pay | Admitting: Adult Health

## 2021-11-29 ENCOUNTER — Ambulatory Visit: Payer: 59 | Admitting: Adult Health

## 2021-11-29 ENCOUNTER — Telehealth: Payer: Self-pay

## 2021-11-29 ENCOUNTER — Other Ambulatory Visit: Payer: Self-pay

## 2021-11-29 VITALS — BP 103/74 | HR 94 | Ht 63.0 in | Wt 148.2 lb

## 2021-11-29 DIAGNOSIS — G43719 Chronic migraine without aura, intractable, without status migrainosus: Secondary | ICD-10-CM | POA: Diagnosis not present

## 2021-11-29 DIAGNOSIS — R109 Unspecified abdominal pain: Secondary | ICD-10-CM

## 2021-11-29 MED ORDER — KETOROLAC TROMETHAMINE 60 MG/2ML IM SOLN
30.0000 mg | Freq: Once | INTRAMUSCULAR | Status: AC
  Administered 2021-11-29: 30 mg via INTRAMUSCULAR

## 2021-11-29 MED ORDER — KETOROLAC TROMETHAMINE 30 MG/ML IJ SOLN: 30.0000 mg | Freq: Once | INTRAMUSCULAR | Status: DC

## 2021-11-29 MED ORDER — PREDNISONE 5 MG PO TABS: ORAL_TABLET | ORAL | 0 refills | Status: DC

## 2021-11-29 NOTE — Patient Instructions (Signed)
Stop OTC tylenol ibuprofen  Toradol injection today Prednisone dosepak ordered but check with GI before taking

## 2021-11-29 NOTE — Telephone Encounter (Signed)
Pt phoned and said she had the ultrasound done and it was all clear --neg. She is asking can she be referred to see a general surgeon regarding her appendix. She states we can contact her by leaving a vm on her phone or through MyChart. Please advise

## 2021-11-29 NOTE — Progress Notes (Signed)
Per Fausto Skillern please give patient 30 mg of Toradol . Vial came in 33mL had to waste 71mL  Andrey Campanile RN witnessed me waste . Gave pt injection in right deltoid  Placed bandade on injection site . Pt wanted 10 minutes after injection to make sure she had no reaction. Pt thanked me and went to check out

## 2021-11-29 NOTE — Progress Notes (Signed)
PATIENT: Stacey Compton DOB: 01-23-1986  REASON FOR VISIT: follow up HISTORY FROM: patient Primary neurologist: Dr. Felecia Shelling  HISTORY OF PRESENT ILLNESS: Today 11/29/21:  Stacey Compton is a 36 year old female with a history of migraine headaches.  She returns today with an intractable migraine. States that has been ongoing for the last 20 days.  She states that she is also been having some stomach issues.  Her GI doctor may take her appendix out per patient.  States that she has a stone there.  She is waiting to hear back.  States that she has been taking ibuprofen or Tylenol on a daily basis due to discomfort from her stomach.  States that Nurtec has not been working for her headaches.  Reports that in the past she did receive an injection from our office that she found beneficial.  Returns today for an evaluation.  Stacey Compton is a 36 year old female with a history of migraine headaches.  She returns today for follow-up.  She reports that her headaches have been under relatively good control.  She did stop Ajovy 3 months ago.  She states that she did forget a dose and then she felt that it may be contributing to some of her stomach issues.  She states that since she has been off the medication she has not seen an increase in her headaches.  She has approximately 1-2 headaches a week.  They typically resolve in 45 minutes to 1 hour with Nurtec.  She returns today for evaluation.  04/28/21: Stacey Compton is a 36 year old female with a history of migraine headaches.  She returns today for follow-up.  She reports in the last 2 weeks her dizziness has improved.  She is not sure why it has improved that she has not changed anything.  She did do vestibular rehab but did not find it helpful.  She has had a complete cardiac work-up that was relatively unremarkable.  She feels that her headaches have been stable.  She has not had to use Nurtec in over a month.  She returns today for follow-up.  03/30/21: Ms.  Compton is a 36 year old female with a history of migraine headaches.  She returns today for follow-up.  The patient states that she has had dizziness has been persistent for the last 3 weeks.  She has a ongoing history of dizziness that occurs intermittently.  She states that some of her episodes has a true room spinning sensation while other times she feels like she is floating and off balance.  She states that when she lays down her symptoms improve but do not resolve.  She states the longer that she sits up the symptoms worsen.  She states that with severe episodes her head and hands feel heavy.  She is also been evaluated by her PCP who has completed blood work that has been relatively unremarkable.  Patient is awaiting a referral to cardiology.  She reports that her headache frequency has improved with Ajovy.  Although in the last 3 weeks the headaches seem to be slightly increased but she relates this to the multitude of other symptoms she is having at this time.  HISTORY 02/18/20:   Stacey Compton is a 36 year old female with a history of migraine headaches.  She returns today for follow-up.  She continues on Aimovig and Nurtec.  She reports that she has approximately 2 headaches a month but they may last 2 to 3 days.  She states that Nurtec does give her some  benefit but does not resolve the headache.  She reports she has noticed recently that she will get changes in her peripheral vision bilaterally prior to her headache.  She describes it as a bug running across the room.  She returns today for an evaluation  REVIEW OF SYSTEMS: Out of a complete 14 system review of symptoms, the patient complains only of the following symptoms, and all other reviewed systems are negative.  See HPI  ALLERGIES: Allergies  Allergen Reactions   Hydrocodone Bit-Homatrop Mbr Nausea And Vomiting and Rash    Hycodan    Percocet [Oxycodone-Acetaminophen] Hives and Rash    HOME MEDICATIONS: Outpatient Medications  Prior to Visit  Medication Sig Dispense Refill   albuterol (VENTOLIN HFA) 108 (90 Base) MCG/ACT inhaler Inhale 2 puffs into the lungs every 6 (six) hours as needed for wheezing or shortness of breath. 8 g 0   citalopram (CELEXA) 20 MG tablet TAKE 1 TABLET(20 MG) BY MOUTH AT BEDTIME 30 tablet 2   clonazePAM (KLONOPIN) 0.5 MG tablet TAKE 1/2 TO 1 (ONE-HALF TO ONE) TABLET BY MOUTH TWICE DAILY AS NEEDED FOR ANXIETY 24 tablet 1   cyclobenzaprine (FLEXERIL) 10 MG tablet Take 10 mg by mouth at bedtime as needed.     Etonogestrel (NEXPLANON [place]) Inject 1 Device into the skin once.      fludrocortisone (FLORINEF) 0.1 MG tablet Take 1 tablet by mouth once daily 90 tablet 1   NURTEC 75 MG TBDP TAKE 1 TABLET BY MOUTH AT ONSET OF MIGRAINE AND MAY REPEAT 1 TABLET IN 24 HOUR 8 tablet 11   ORILISSA 150 MG TABS Take 150 mg by mouth at bedtime.     pantoprazole (PROTONIX) 40 MG tablet TAKE 1 TABLET(40 MG) BY MOUTH DAILY 30 tablet 5   dicyclomine (BENTYL) 10 MG capsule Take 1 capsule (10 mg total) by mouth 4 (four) times daily -  before meals and at bedtime. (Patient taking differently: Take 10 mg by mouth 4 (four) times daily -  before meals and at bedtime. As needed) 120 capsule 5   Facility-Administered Medications Prior to Visit  Medication Dose Route Frequency Provider Last Rate Last Admin   methylPREDNISolone acetate (DEPO-MEDROL) injection 40 mg  40 mg Intra-Lesional Once Mikey Kirschner, MD        PAST MEDICAL HISTORY: Past Medical History:  Diagnosis Date   Anemia    Anxiety    B12 deficiency 02/10/2021   B12 low end of normal patient with significant fatigue will go ahead and treat April 2020   Depression    denies   Endometriosis    Hx of varicella    IBS (irritable bowel syndrome)    Infection    UTI   Migraines    with aura   Migraines    PONV (postoperative nausea and vomiting)     PAST SURGICAL HISTORY: Past Surgical History:  Procedure Laterality Date   BIOPSY  09/11/2020    Procedure: BIOPSY;  Surgeon: Eloise Harman, DO;  Location: [facility];  Service: Endoscopy;;   CHOLECYSTECTOMY N/A 11/18/2019   Procedure: LAPAROSCOPIC CHOLECYSTECTOMY;  Surgeon: Virl Cagey, MD;  Location: [facility];  Service: General;  Laterality: N/A;   COLONOSCOPY WITH PROPOFOL N/A 09/11/2020   Procedure: COLONOSCOPY WITH PROPOFOL;  Surgeon: Eloise Harman, DO;  Location: [facility];  Service: Endoscopy;  Laterality: N/A;  10:30am   ESOPHAGOGASTRODUODENOSCOPY (EGD) WITH PROPOFOL N/A 09/11/2020   Procedure: ESOPHAGOGASTRODUODENOSCOPY (EGD) WITH PROPOFOL;  Surgeon: Hurshel Keys  K, DO;  Location: [facility];  Service: Endoscopy;  Laterality: N/A;   LAPAROSCOPY     with chromopertubation   OTHER SURGICAL HISTORY     dx lap for endometriosis    FAMILY HISTORY: Family History  Problem Relation Age of Onset   Fibromyalgia Mother    Diabetes Mother        borderline   Depression Father    Diabetes Father        type 2   Colon polyps Father        >55   Anxiety disorder Sister    Crohn's disease Sister    Bipolar disorder Maternal Aunt    Cancer Maternal Grandmother        mouth and lung   Bipolar disorder Maternal Grandfather    Stroke Maternal Grandfather    Migraines Maternal Grandfather    Heart disease Paternal Grandfather    Colon cancer Cousin        maternal   Celiac disease Neg Hx     SOCIAL HISTORY: Social History   Socioeconomic History   Marital status: Married    Spouse name: Not on file   Number of children: Not on file   Years of education: Not on file   Highest education level: Not on file  Occupational History   Not on file  Tobacco Use   Smoking status: Never   Smokeless tobacco: Never  Vaping Use   Vaping Use: Never used  Substance and Sexual Activity   Alcohol use: No   Drug use: No   Sexual activity: Yes    Birth control/protection: None  Other Topics Concern   Not on file  Social History Narrative   Not on file    Social Determinants of Health   Financial Resource Strain: Not on file  Food Insecurity: Not on file  Transportation Needs: Not on file  Physical Activity: Not on file  Stress: Not on file  Social Connections: Not on file  Intimate Partner Violence: Not on file      PHYSICAL EXAM  Vitals:   11/29/21 1438  BP: 103/74  Pulse: 94  Weight: 148 lb 3.2 oz (67.2 kg)  Height: 5\' 3"  (1.6 m)   Body mass index is 26.25 kg/m.  Generalized: Well developed, in no acute distress   Neurological examination  Mentation: Alert oriented to time, place, history taking. Follows all commands speech and language fluent Cranial nerve II-XII: Pupils were equal round reactive to light. Extraocular movements were full, visual field were full on confrontational test.   Head turning and shoulder shrug  were normal and symmetric.   Motor: The motor testing reveals 5 over 5 strength of all 4 extremities. Good symmetric motor tone is noted throughout.  Sensory: Sensory testing is intact to soft touch on all 4 extremities. No evidence of extinction is noted.  Coordination: Cerebellar testing reveals good finger-nose-finger and heel-to-shin bilaterally.  Gait and station: Gait is normal Reflexes: Deep tendon reflexes are symmetric and normal bilaterally.   DIAGNOSTIC DATA (LABS, IMAGING, TESTING) - I reviewed patient records, labs, notes, testing and imaging myself where available.  Lab Results  Component Value Date   WBC 7.4 12/04/2020   HGB 14.1 12/04/2020   HCT 42.2 12/04/2020   MCV 95 12/04/2020   PLT 285 12/04/2020      Component Value Date/Time   NA 140 04/14/2021 1340   K 4.1 04/14/2021 1340   CL 101 04/14/2021 1340   CO2 22  04/14/2021 1340   GLUCOSE 103 (H) 04/14/2021 1340   GLUCOSE 105 (H) 10/23/2019 1730   BUN 5 (L) 04/14/2021 1340   CREATININE 0.61 04/14/2021 1340   CALCIUM 9.6 04/14/2021 1340   PROT 7.6 12/04/2020 1138   ALBUMIN 4.7 12/04/2020 1138   AST 15 12/04/2020 1138    ALT 11 12/04/2020 1138   ALKPHOS 51 12/04/2020 1138   BILITOT 0.5 12/04/2020 1138   GFRNONAA 113 12/04/2020 1138   GFRAA 131 12/04/2020 1138    Lab Results  Component Value Date   TSH 1.600 12/04/2020      ASSESSMENT AND PLAN 36 y.o. year old female  has a past medical history of Anemia, Anxiety, B12 deficiency (02/10/2021), Depression, Endometriosis, varicella, IBS (irritable bowel syndrome), Infection, Migraines, Migraines, and PONV (postoperative nausea and vomiting). here with:  1.  Intractable migraine headache  -Continue Nurtec as abortive therapy -Toradol IM 30 mg today -Ordered prednisone Dosepak however advised her to get approval from her GI doctor before taking medication -Advised if her headache continues we will discuss daily preventative medication   -Follow-up in 6 months or sooner if needed    Ward Givens, MSN, NP-C 11/29/2021, 2:54 PM [facility] [address], [place], [place] [address] [phone]

## 2021-11-30 ENCOUNTER — Telehealth: Payer: Self-pay | Admitting: Internal Medicine

## 2021-11-30 NOTE — Telephone Encounter (Signed)
Pt said she has additional questions for the nurse. She had called yesterday. She said she didn't have our office as an opinion to send message via MyChart. I gave her the Mychart support number. Please call her at [phone]      Note      Dr. Marletta Lor, I returned the pt's call and was advised that she seen her neurologist and they want to put her on a Prednisone taper to help with her migraines. She was advised to check with you first to see your thoughts on it. Please advise

## 2021-11-30 NOTE — Telephone Encounter (Signed)
Note was addendum to yesterdays note

## 2021-11-30 NOTE — Telephone Encounter (Signed)
Pt said she has additional questions for the nurse. She had called yesterday. She said she didn't have our office as an opinion to send message via MyChart. I gave her the Mychart support number. Please call her at [phone]

## 2021-12-01 ENCOUNTER — Other Ambulatory Visit: Payer: Self-pay

## 2021-12-01 ENCOUNTER — Emergency Department (HOSPITAL_COMMUNITY)
Admission: EM | Admit: 2021-12-01 | Discharge: 2021-12-01 | Disposition: A | Discharge status: Home / Self Care | Payer: 59 | Attending: Emergency Medicine | Admitting: Emergency Medicine

## 2021-12-01 ENCOUNTER — Emergency Department (HOSPITAL_COMMUNITY): Payer: 59

## 2021-12-01 ENCOUNTER — Encounter (HOSPITAL_COMMUNITY): Payer: Self-pay | Admitting: *Deleted

## 2021-12-01 DIAGNOSIS — G43809 Other migraine, not intractable, without status migrainosus: Secondary | ICD-10-CM | POA: Insufficient documentation

## 2021-12-01 DIAGNOSIS — R519 Headache, unspecified: Secondary | ICD-10-CM | POA: Diagnosis present

## 2021-12-01 LAB — CBC WITH DIFFERENTIAL/PLATELET
Abs Immature Granulocytes: 0.02 10*3/uL (ref 0.00–0.07)
Basophils Absolute: 0 10*3/uL (ref 0.0–0.1)
Basophils Relative: 1 %
Eosinophils Absolute: 0.2 10*3/uL (ref 0.0–0.5)
Eosinophils Relative: 2 %
HCT: 41.4 % (ref 36.0–46.0)
Hemoglobin: 13.9 g/dL (ref 12.0–15.0)
Immature Granulocytes: 0 %
Lymphocytes Relative: 30 %
Lymphs Abs: 2.6 10*3/uL (ref 0.7–4.0)
MCH: 31.6 pg (ref 26.0–34.0)
MCHC: 33.6 g/dL (ref 30.0–36.0)
MCV: 94.1 fL (ref 80.0–100.0)
Monocytes Absolute: 0.5 10*3/uL (ref 0.1–1.0)
Monocytes Relative: 5 %
Neutro Abs: 5.3 10*3/uL (ref 1.7–7.7)
Neutrophils Relative %: 62 %
Platelets: 278 10*3/uL (ref 150–400)
RBC: 4.4 MIL/uL (ref 3.87–5.11)
RDW: 11.7 % (ref 11.5–15.5)
WBC: 8.6 10*3/uL (ref 4.0–10.5)
nRBC: 0 % (ref 0.0–0.2)

## 2021-12-01 LAB — COMPREHENSIVE METABOLIC PANEL
ALT: 18 U/L (ref 0–44)
AST: 19 U/L (ref 15–41)
Albumin: 4.8 g/dL (ref 3.5–5.0)
Alkaline Phosphatase: 48 U/L (ref 38–126)
Anion gap: 11 (ref 5–15)
BUN: 7 mg/dL (ref 6–20)
CO2: 26 mmol/L (ref 22–32)
Calcium: 9.5 mg/dL (ref 8.9–10.3)
Chloride: 100 mmol/L (ref 98–111)
Creatinine, Ser: 0.7 mg/dL (ref 0.44–1.00)
GFR, Estimated: >60 mL/min (ref >60)
Glucose, Bld: 73 mg/dL (ref 70–99)
Potassium: 3.5 mmol/L (ref 3.5–5.1)
Sodium: 137 mmol/L (ref 135–145)
Total Bilirubin: <0.1 mg/dL — ABNORMAL LOW (ref 0.3–1.2)
Total Protein: 8.4 g/dL — ABNORMAL HIGH (ref 6.5–8.1)

## 2021-12-01 LAB — PREGNANCY, URINE: Preg Test, Ur: NEGATIVE

## 2021-12-01 MED ORDER — IOHEXOL 350 MG/ML SOLN
100.0000 mL | Freq: Once | INTRAVENOUS | Status: AC | PRN
  Administered 2021-12-01: 11:00:00 75 mL via INTRAVENOUS

## 2021-12-01 MED ORDER — MAGNESIUM SULFATE 2 GM/50ML IV SOLN
2.0000 g | INTRAVENOUS | Status: AC
  Administered 2021-12-01: 11:00:00 2 g via INTRAVENOUS
  Filled 2021-12-01: qty 50

## 2021-12-01 MED ORDER — KETOROLAC TROMETHAMINE 30 MG/ML IJ SOLN
30.0000 mg | Freq: Once | INTRAMUSCULAR | Status: AC
  Administered 2021-12-01: 11:00:00 30 mg via INTRAVENOUS
  Filled 2021-12-01: qty 1

## 2021-12-01 MED ORDER — PROCHLORPERAZINE EDISYLATE 10 MG/2ML IJ SOLN
10.0000 mg | Freq: Once | INTRAMUSCULAR | Status: AC
Start: 2021-12-01 — End: 2021-12-01
  Administered 2021-12-01: 11:00:00 10 mg via INTRAVENOUS
  Filled 2021-12-01: qty 2

## 2021-12-01 MED ORDER — DEXAMETHASONE SODIUM PHOSPHATE 10 MG/ML IJ SOLN
10.0000 mg | Freq: Once | INTRAMUSCULAR | Status: AC
  Administered 2021-12-01: 11:00:00 10 mg via INTRAVENOUS
  Filled 2021-12-01: qty 1

## 2021-12-01 MED ORDER — SODIUM CHLORIDE 0.9 % IV SOLN: INTRAVENOUS | Status: DC

## 2021-12-01 NOTE — Telephone Encounter (Signed)
Noted, thank you

## 2021-12-01 NOTE — ED Notes (Signed)
Second nurse attempting [place] IV at this time.

## 2021-12-01 NOTE — ED Notes (Signed)
Ambulatory to the restroom.

## 2021-12-01 NOTE — ED Provider Notes (Signed)
[facility] Provider Note   CSN: [id number] Arrival date & time: 12/01/21  [id number]     History  Chief Complaint  Patient presents with   Neck Pain    Stacey Compton is a 36 y.o. female.   Neck Pain  This patient is a 36 year old female who suffers with different things such as anxiety and chronic migraine headaches.  She is on medications including clonazepam, Celexa, Nurtec, she also takes fludrocortisone for low blood pressure.  She presents today stating that she has had a headache that has been present for approximately 3 weeks, she was seen at the neurology office 2 days ago during which time she had an injection of Toradol, she was then seen by her family doctor last night as she was starting to have some increasing pressure in her neck, she had lab work drawn and was told to come to the ER if things were to get worse.  This morning she noticed that she was having some slight numbness on the right face the right arm and the right leg, she was having difficulty with blurred vision in her right eye and had increasing pain in the right neck.  No chest pain coughing or shortness of breath, she has had nausea but no vomiting, no diarrhea.  She has also been taking a muscle relaxer.  She states this is different than her prior migraine headaches which usually last a day or so.  At this time the patient denies fevers or chills, denies head injuries, she has never had a evaluation of the blood vessels of her neck though she did have an MRI last year which was unremarkable.  She was actually seen at [facility] because of her chronic migraines.  Home Medications Prior to Admission medications   Medication Sig Start Date End Date Taking? Authorizing Provider  citalopram (CELEXA) 20 MG tablet TAKE 1 TABLET(20 MG) BY MOUTH AT BEDTIME 09/17/21  Yes Luking, Scott A, MD  cyclobenzaprine (FLEXERIL) 10 MG tablet Take 10 mg by mouth at bedtime as needed for muscle spasms.  11/15/21  Yes [provider]  fludrocortisone (FLORINEF) 0.1 MG tablet Take 1 tablet by mouth once daily 09/28/21  Yes Branch, Alphonse Guild, MD  NURTEC 75 MG TBDP TAKE 1 TABLET BY MOUTH AT ONSET OF MIGRAINE AND MAY REPEAT 1 TABLET IN 24 HOUR 11/11/21  Yes Millikan, Megan, NP  ORILISSA 150 MG TABS Take 150 mg by mouth at bedtime. 08/24/20  Yes [provider]  pantoprazole (PROTONIX) 40 MG tablet TAKE 1 TABLET(40 MG) BY MOUTH DAILY 03/17/21  Yes Mahala Menghini, PA-C  albuterol (VENTOLIN HFA) 108 (90 Base) MCG/ACT inhaler Inhale 2 puffs into the lungs every 6 (six) hours as needed for wheezing or shortness of breath. 05/23/21   Brunetta Jeans, PA-C  clonazePAM (KLONOPIN) 0.5 MG tablet TAKE 1/2 TO 1 (ONE-HALF TO ONE) TABLET BY MOUTH TWICE DAILY AS NEEDED FOR ANXIETY Patient not taking: Reported on 12/01/2021 12/18/20   Kathyrn Drown, MD  dicyclomine (BENTYL) 10 MG capsule Take 1 capsule (10 mg total) by mouth 4 (four) times daily -  before meals and at bedtime. Patient taking differently: Take 10 mg by mouth 4 (four) times daily -  before meals and at bedtime. As needed 12/10/20 11/24/21  Eloise Harman, DO  predniSONE (DELTASONE) 5 MG tablet Begin taking 6 tablets daily, taper by one tablet daily until off the medication. Patient not taking: Reported on 12/01/2021 11/29/21  Ward Givens, NP      Allergies    Hydrocodone bit-homatrop mbr and Percocet [oxycodone-acetaminophen]    Review of Systems   Review of Systems  Musculoskeletal:  Positive for neck pain.  All other systems reviewed and are negative.  Physical Exam Updated Vital Signs BP 120/73    Pulse 81    Resp 18    Ht 1.6 m (5\' 3" )    Wt 67.2 kg    SpO2 100%    BMI 26.25 kg/m  Physical Exam Vitals and nursing note reviewed.  Constitutional:      General: She is not in acute distress.    Appearance: She is well-developed.  HENT:     Head: Normocephalic and atraumatic.     Mouth/Throat:     Pharynx: No  oropharyngeal exudate.  Eyes:     General: No scleral icterus.       Right eye: No discharge.        Left eye: No discharge.     Conjunctiva/sclera: Conjunctivae normal.     Pupils: Pupils are equal, round, and reactive to light.  Neck:     Thyroid: No thyromegaly.     Vascular: No JVD.  Cardiovascular:     Rate and Rhythm: Normal rate and regular rhythm.     Heart sounds: Normal heart sounds. No murmur heard.   No friction rub. No gallop.  Pulmonary:     Effort: Pulmonary effort is normal. No respiratory distress.     Breath sounds: Normal breath sounds. No wheezing or rales.  Abdominal:     General: Bowel sounds are normal. There is no distension.     Palpations: Abdomen is soft. There is no mass.     Tenderness: There is no abdominal tenderness.  Musculoskeletal:        General: No tenderness. Normal range of motion.     Cervical back: Normal range of motion and neck supple.  Lymphadenopathy:     Cervical: No cervical adenopathy.  Skin:    General: Skin is warm and dry.     Findings: No erythema or rash.  Neurological:     Mental Status: She is alert.     Coordination: Coordination normal.     Comments: Slight decrease sensation to the right face right arm and right leg.  Her motor sensation is totally normal including the symmetry of the face and her ability to speak.  She stutters occasionally and gets frustrated by her inability to maintain her thought process but is able to fully answer my questions and give descriptions of her symptoms with minimal difficulty.  She has no pronator drift, she has normal finger-nose-finger.  Psychiatric:        Behavior: Behavior normal.    ED Results / Procedures / Treatments   Labs (all labs ordered are listed, but only abnormal results are displayed) Labs Reviewed  COMPREHENSIVE METABOLIC PANEL - Abnormal; Notable for the following components:      Result Value   Total Protein 8.4 (*)    Total Bilirubin <0.1 (*)    All other  components within normal limits  CBC WITH DIFFERENTIAL/PLATELET  PREGNANCY, URINE    EKG None  Radiology CT ANGIO HEAD NECK W WO CM  Result Date: 12/01/2021 CLINICAL DATA:  Right-sided weakness with right neck pain for 1 day EXAM: CT ANGIOGRAPHY HEAD AND NECK TECHNIQUE: Multidetector CT imaging of the head and neck was performed using the standard protocol during bolus administration of intravenous contrast.  Multiplanar CT image reconstructions and MIPs were obtained to evaluate the vascular anatomy. Carotid stenosis measurements (when applicable) are obtained utilizing NASCET criteria, using the distal internal carotid diameter as the denominator. RADIATION DOSE REDUCTION: This exam was performed according to the departmental dose-optimization program which includes automated exposure control, adjustment of the mA and/or kV according to patient size and/or use of iterative reconstruction technique. CONTRAST:  9mL OMNIPAQUE IOHEXOL 350 MG/ML SOLN COMPARISON:  Brain MRI 10/30/2019 FINDINGS: CT HEAD FINDINGS Brain: There is no evidence of acute intracranial hemorrhage, extra-axial fluid collection, or acute infarct. Parenchymal volume is normal. The ventricles are normal in size. The parenchyma is normal in appearance There is no mass lesion.  There is no midline shift. Vascular: No dense vessel is seen. Skull: Normal. Negative for fracture or focal lesion. Sinuses: The paranasal sinuses are clear. Orbits: The globes and orbits are unremarkable. Review of the MIP images confirms the above findings CTA NECK FINDINGS Aortic arch: The aortic arch is normal. The origins of the major branch vessels are patent. The subclavian arteries are patent. Right carotid system: The right common, internal, and external carotid arteries are patent, without hemodynamically significant stenosis, occlusion, dissection, or aneurysm. Left carotid system: The left common, internal, and external carotid arteries are patent,  without hemodynamically significant stenosis, occlusion, dissection, or aneurysm. Vertebral arteries: The vertebral arteries are patent, without hemodynamically significant stenosis, occlusion, dissection, or aneurysm. Skeleton: The bones are normal. There is no acute osseous abnormality or aggressive osseous lesion. There is no visible canal hematoma. Other neck: The soft tissues are unremarkable. Upper chest: The lung apices are clear. Review of the MIP images confirms the above findings CTA HEAD FINDINGS Anterior circulation: The intracranial ICAs are patent. The bilateral MCAs are patent. The bilateral ACAs are patent. The anterior communicating artery is normal. There is no aneurysm or AVM. Posterior circulation: The bilateral V4 segments are patent. PICA is identified bilaterally. The basilar artery is patent. The bilateral PCAs are patent. Small bilateral posterior communicating arteries are seen. There is no aneurysm or AVM. Venous sinuses: Patent. Anatomic variants: None. Review of the MIP images confirms the above findings IMPRESSION: Normal CT/CTA of the head and neck. Electronically Signed   By: Lesia Hausen M.D.   On: 12/01/2021 11:53    Procedures Procedures    Medications Ordered in ED Medications  0.9 %  sodium chloride infusion ( Intravenous New Bag/Given 12/01/21 1049)  magnesium sulfate IVPB 2 g 50 mL (0 g Intravenous Stopped 12/01/21 1219)  dexamethasone (DECADRON) injection 10 mg (10 mg Intravenous Given 12/01/21 1044)  ketorolac (TORADOL) 30 MG/ML injection 30 mg (30 mg Intravenous Given 12/01/21 1042)  prochlorperazine (COMPAZINE) injection 10 mg (10 mg Intravenous Given 12/01/21 1037)  iohexol (OMNIPAQUE) 350 MG/ML injection 100 mL (75 mLs Intravenous Contrast Given 12/01/21 1120)    ED Course/ Medical Decision Making/ A&P                           Medical Decision Making Amount and/or Complexity of Data Reviewed Labs: ordered. Radiology: ordered.  Risk Prescription drug  management.   This patient presents to the ED for concern of headache, right neck pain and numbness, this involves an extensive number of treatment options, and is a complaint that carries with it a high risk of complications and morbidity.  The differential diagnosis includes cervical artery dissection, stroke though this seems less likely, migraine headache, complicated migraine, electrolyte disturbance  Co morbidities that complicate the patient evaluation  Chronic anxiety and migraine headache   Additional history obtained:  Additional history obtained from father who is at the bedside who states that this seems to be a recurrent thing and she had something like this last year External records from outside source obtained and reviewed including MRI reports which were unremarkable   Lab Tests:  I Ordered, and personally interpreted labs.  The pertinent results include: CBC and metabolic panel both of which were unremarkable   Imaging Studies ordered:  I ordered imaging studies including CT angiogram of the head and neck I independently visualized and interpreted imaging which showed no signs of acute obstructive disease I agree with the radiologist interpretation   Cardiac Monitoring:  The patient was maintained on a cardiac monitor.  I personally viewed and interpreted the cardiac monitored which showed an underlying rhythm of: Normal sinus rhythm   Medicines ordered and prescription drug management:  I ordered medication including IV fluids, magnesium, Decadron, Toradol and Compazine for headache Reevaluation of the patient after these medicines showed that the patient improved I have reviewed the patients home medicines and have made adjustments as needed   Test Considered:  MRI of the brain, ultimately did not need this as the patient improved significantly   Critical Interventions:  Evaluation of the cause of the patient's neck and headache, she improved  significantly after medications and on reevaluation felt much better, ready to go home and follow-up with neurology   Reevaluation:  After the interventions noted above, I reevaluated the patient and found that they have :improved   Social Determinants of Health:  None   Dispostion:  After consideration of the diagnostic results and the patients response to treatment, I feel that the patent would benefit from discharge home.          Final Clinical Impression(s) / ED Diagnoses Final diagnoses:  Other migraine without status migrainosus, not intractable      Noemi Chapel, MD 12/01/21 1331

## 2021-12-01 NOTE — ED Notes (Signed)
Pt ambulated to the restroom.

## 2021-12-01 NOTE — Discharge Instructions (Signed)
Please call your neurologist this afternoon, please inform them that you were given IV fluids, Decadron, Toradol, Compazine and magnesium and improved.  They will need to help arrange follow-up for you in the office, ER for worsening symptoms

## 2021-12-01 NOTE — ED Notes (Signed)
Attempted IV access. Second nurse attempting at this time.  ?

## 2021-12-01 NOTE — ED Triage Notes (Signed)
Pt c/o right sided neck pain that started yesterday. Pt saw her PCP last night and given Toradol and bloodwork taken. Pt told to come to the ED if symptoms worsened. Pt reports her right side feels weaker than normal that started this morning.

## 2021-12-01 NOTE — Telephone Encounter (Signed)
I discussed patient's case with Dr. Henreitta Leber who feels that gynecological evaluation is appropriate prior to seeing them for potential appendectomy.  Based on other message, appears that she is seeing gynecology soon.  Okay for her to go on prednisone taper from my standpoint in regards to her migraines.  Thank you

## 2021-12-02 ENCOUNTER — Telehealth: Payer: Self-pay

## 2021-12-02 NOTE — Telephone Encounter (Signed)
Referral placed.

## 2021-12-02 NOTE — Telephone Encounter (Signed)
Okay to refer to dr bridges

## 2021-12-02 NOTE — Telephone Encounter (Signed)
error 

## 2021-12-02 NOTE — Telephone Encounter (Signed)
Phoned the pt and LMOVM ok to go on Prednisone Taper for migraines from his standpoint.

## 2021-12-02 NOTE — Telephone Encounter (Signed)
Dr. Marletta Lor Pt returned my call and advised ok to do prednisone taper for migraines but she was also wondering about the referral to see general surgeon because everything with GYN checked out ok. Is it ok to refer to general surgeon? Please advise

## 2021-12-02 NOTE — Addendum Note (Signed)
Addended by: Armstead Peaks on: 12/02/2021 11:22 AM   Modules accepted: Orders

## 2021-12-06 ENCOUNTER — Encounter: Payer: Self-pay | Admitting: Adult Health

## 2021-12-08 ENCOUNTER — Ambulatory Visit: Payer: 59 | Admitting: Neurology

## 2021-12-08 ENCOUNTER — Telehealth: Payer: Self-pay | Admitting: Neurology

## 2021-12-08 ENCOUNTER — Encounter: Payer: Self-pay | Admitting: Neurology

## 2021-12-08 ENCOUNTER — Ambulatory Visit: Payer: 59 | Admitting: General Surgery

## 2021-12-08 ENCOUNTER — Other Ambulatory Visit: Payer: Self-pay

## 2021-12-08 ENCOUNTER — Encounter: Payer: Self-pay | Admitting: General Surgery

## 2021-12-08 VITALS — BP 107/75 | HR 94 | Temp 97.9°F | Resp 16 | Ht 63.0 in | Wt 150.0 lb

## 2021-12-08 VITALS — BP 108/74 | HR 96 | Ht 63.0 in | Wt 148.5 lb

## 2021-12-08 DIAGNOSIS — R4189 Other symptoms and signs involving cognitive functions and awareness: Secondary | ICD-10-CM

## 2021-12-08 DIAGNOSIS — R1031 Right lower quadrant pain: Secondary | ICD-10-CM | POA: Diagnosis not present

## 2021-12-08 DIAGNOSIS — G43719 Chronic migraine without aura, intractable, without status migrainosus: Secondary | ICD-10-CM | POA: Diagnosis not present

## 2021-12-08 DIAGNOSIS — R42 Dizziness and giddiness: Secondary | ICD-10-CM | POA: Diagnosis not present

## 2021-12-08 MED ORDER — EMGALITY 120 MG/ML ~~LOC~~ SOAJ: 1.0000 "pen " | SUBCUTANEOUS | 4 refills | Status: DC

## 2021-12-08 NOTE — Progress Notes (Signed)
[facility] History and Physical  Reason for Referral: Appendicolith, RLQ pain  Referring Physician:  Dr. Abbey Chatters   Chief Complaint   Office Visit     Stacey Compton is a 36 y.o. female.  HPI: Stacey Compton is a 36 yo who I took her gallbladder out a few years ago. For the last 6 weeks or so she has been having constant, sharp pain in her right side. This was not associated with any injury or exercise. She says that the pain is getting worse and is affecting her daily life and preventing her from interacting with her children. She has been seen by GI and her GYN. She was noted to have a possible appendicolith on CT and was sent to me to discuss appendectomy. She is otherwise doing and is not constipated after taking some medication after her CT demonstrated stool burden. She had an [place] per report with her Gyn, [facility], and she indicates this was within normal limits. She denies any fevers or chills. She has not nausea or vomiting. She just feels like the pain is consant and is on her right flank lower abdomen in approximately the location where her appendix is on her 2021 CT.   She had surgery for endometriosis in the past but says this pain is different and is not cyclic. She does not have a period and has the implant in her arm for birth control/ menstruation.   She has IBS and takes bentyl. She says this is different than that has been in the past. She has not personal or family history of Crohn's.   Her [facility] CT is not available on my Epic.  Past Medical History:  Diagnosis Date   Anemia    Anxiety    B12 deficiency 02/10/2021   B12 low end of normal patient with significant fatigue will go ahead and treat April 2020   Depression    denies   Endometriosis    Hx of varicella    IBS (irritable bowel syndrome)    Infection    UTI   Migraines    with aura   Migraines    PONV (postoperative nausea and vomiting)     Past Surgical  History:  Procedure Laterality Date   BIOPSY  09/11/2020   Procedure: BIOPSY;  Surgeon: Eloise Harman, DO;  [facility];  Service: Endoscopy;;   CHOLECYSTECTOMY N/A 11/18/2019   Procedure: LAPAROSCOPIC CHOLECYSTECTOMY;  Surgeon: Virl Cagey, MD;  Location: [facility];  Service: General;  Laterality: N/A;   COLONOSCOPY WITH PROPOFOL N/A 09/11/2020   Procedure: COLONOSCOPY WITH PROPOFOL;  Surgeon: Eloise Harman, DO;  [facility];  Service: Endoscopy;  Laterality: N/A;  10:30am   ESOPHAGOGASTRODUODENOSCOPY (EGD) WITH PROPOFOL N/A 09/11/2020   Procedure: ESOPHAGOGASTRODUODENOSCOPY (EGD) WITH PROPOFOL;  Surgeon: Eloise Harman, DO;  [facility];  Service: Endoscopy;  Laterality: N/A;   LAPAROSCOPY     with chromopertubation   OTHER SURGICAL HISTORY     dx lap for endometriosis    Family History  Problem Relation Age of Onset   Fibromyalgia Mother    Diabetes Mother        borderline   Depression Father    Diabetes Father        type 2   Colon polyps Father        >67   Anxiety disorder Sister    Crohn's disease Sister    Bipolar disorder  Maternal Aunt    Cancer Maternal Grandmother        mouth and lung   Bipolar disorder Maternal Grandfather    Stroke Maternal Grandfather    Migraines Maternal Grandfather    Heart disease Paternal Grandfather    Colon cancer Cousin        maternal   Celiac disease Neg Hx     Social History   Tobacco Use   Smoking status: Never   Smokeless tobacco: Never  Vaping Use   Vaping Use: Never used  Substance Use Topics   Alcohol use: No   Drug use: No    Medications: I have reviewed the patient's current medications. Allergies as of 12/08/2021       Reactions   Hydrocodone Bit-homatrop Mbr Nausea And Vomiting, Rash   Hycodan    Percocet [oxycodone-acetaminophen] Hives, Rash        Medication List        Accurate as of December 08, 2021  3:45 PM. If you have any questions, ask your nurse  or doctor.          albuterol 108 (90 Base) MCG/ACT inhaler Commonly known as: VENTOLIN HFA Inhale 2 puffs into the lungs every 6 (six) hours as needed for wheezing or shortness of breath.   citalopram 20 MG tablet Commonly known as: CELEXA TAKE 1 TABLET(20 MG) BY MOUTH AT BEDTIME   clonazePAM 0.5 MG tablet Commonly known as: KLONOPIN TAKE 1/2 TO 1 (ONE-HALF TO ONE) TABLET BY MOUTH TWICE DAILY AS NEEDED FOR ANXIETY   cyclobenzaprine 10 MG tablet Commonly known as: FLEXERIL Take 10 mg by mouth at bedtime as needed for muscle spasms.   dicyclomine 10 MG capsule Commonly known as: BENTYL Take 1 capsule (10 mg total) by mouth 4 (four) times daily -  before meals and at bedtime. What changed: additional instructions   Emgality 120 MG/ML Soaj Generic drug: Galcanezumab-gnlm Inject 1 pen into the skin every 28 (twenty-eight) days. Started by: Britt Bottom, MD   fludrocortisone 0.1 MG tablet Commonly known as: FLORINEF Take 1 tablet by mouth once daily   Nurtec 75 MG Tbdp Generic drug: Rimegepant Sulfate TAKE 1 TABLET BY MOUTH AT ONSET OF MIGRAINE AND MAY REPEAT 1 TABLET IN 24 HOUR   Orilissa 150 MG Tabs Generic drug: Elagolix Sodium Take 150 mg by mouth at bedtime.   pantoprazole 40 MG tablet Commonly known as: PROTONIX TAKE 1 TABLET(40 MG) BY MOUTH DAILY   predniSONE 5 MG tablet Commonly known as: DELTASONE Begin taking 6 tablets daily, taper by one tablet daily until off the medication.         ROS:  A comprehensive review of systems was negative except for: Gastrointestinal: positive for abdominal pain and reflux symptoms Neurological: positive for dizziness  Blood pressure 107/75, pulse 94, temperature 97.9 F (36.6 C), temperature source Other (Comment), resp. rate 16, height 5\' 3"  (1.6 m), weight 150 lb (68 kg), SpO2 98 %, currently breastfeeding. Physical Exam Vitals reviewed.  Constitutional:      Appearance: Normal appearance.  HENT:     Head:  Atraumatic.     Nose: Nose normal.     Mouth/Throat:     Mouth: Mucous membranes are moist.  Eyes:     Extraocular Movements: Extraocular movements intact.  Cardiovascular:     Rate and Rhythm: Normal rate.  Pulmonary:     Effort: Pulmonary effort is normal.     Breath sounds: Normal breath sounds.  Abdominal:  General: There is no distension.     Palpations: Abdomen is soft.     Tenderness: There is abdominal tenderness in the right lower quadrant.     Comments: Right flank area  Musculoskeletal:        General: Normal range of motion.     Cervical back: Normal range of motion.  Skin:    General: Skin is warm.  Neurological:     General: No focal deficit present.     Mental Status: She is alert and oriented to person, place, and time.  Psychiatric:        Mood and Affect: Mood normal.        Behavior: Behavior normal.    Results: Personally reviewed CT with radiology from 11/2021 and old CT from 2021, able to get [facility] imaging with radiology PACS.  Appendix in 2021 is not dilated or inflamed, oral contrast in the lumen, 2023 there is some contrast material versus an appendicolith in the appendix, it is in the right flank on the lateral part of the cecum, the appendix is about 4 mm in diameter, and there is no obvious associated inflammation or stranding, no obvious endometrial implants    Assessment & Plan:  JERINE RUFFCORN is a 36 y.o. female with RLQ pain that is worsening and affecting her daily life. I have reviewed imaging with radiology after our visit and discussed the findings with the patient via phone call.  I cannot tell her that this is an appendicolith or old contrast. I do not see any signs of dilation or inflammation. She could have pain from some other source, endometrial implant that is not seen or from the appendix. The appendix is located in the area where she is hurting.   Discussed diagnostic laparoscopy and appendectomy to rule out this being the  issue. Discussed that I will talk to Dr. Elonda Husky, Gyn about the case and his thoughts on the endometriosis and implants and if he would be available to assist if needed. She is good with this plan.  She is going to think about the options and let us know. I told her it is fine to wait a few more weeks/ months to do anything like this and that is essentially up to her based on her pain and how it is affecting her daily life.   Discussed that her pain may not improve at all and that there is risk of bleeding, infection, injury to other organs, finding nothing, and anesthesia.   All questions were answered to the satisfaction of the patient.  She will call and update [place] on her decisions. Message sent to Dr.Eure.   Virl Cagey 12/08/2021, 3:45 PM

## 2021-12-08 NOTE — Telephone Encounter (Signed)
Request Reference Number: [id number]. EMGALITY INJ 120MG /ML is approved through 06/07/2022.

## 2021-12-08 NOTE — Telephone Encounter (Signed)
Completed PA on CMM/[employer] RX KEY: [id number] Will await response

## 2021-12-08 NOTE — Progress Notes (Signed)
[name]  PATIENT: Stacey Compton DOB: 02/07/1986  REFERRING DOCTOR OR PCP:  Lilyan Punt SOURCE: patient, Notes from Dr. Gerda Diss, imaging reports, MRI images on PACS  _________________________________   HISTORICAL  CHIEF COMPLAINT:  Chief Complaint  Patient presents with   Follow-up    Rm 2, alone. Here for migraine f/u. Pt reports Tuesday she reports her sx kept getting worse and having new sx. Was at the ER last wed. Blurred vision, shaky hands, head felt heavy, nauseas, near syncope. Finished prednisone Sunday.     HISTORY OF PRESENT ILLNESS:   Stacey Compton is a 36 y.o. woman with headaches.   Update 12/08/2021: Her typical pattern is 2-3 migraine a month --- aura with visual scotomata followed by HA, Nausea and sometimes mild tingling. .   If she rapidly takes a Nutec she avoids the worse of the headache  She has had a bad headache x 3 weeks that has not abated.  A few days earlier she had abdominal pain and was found to have an appendix fecalith.     She felt the abdominal issues may have triggered the HA.    She was taking every day.  As pain persisted, she came in 8 days ago and had Toradol and a steroid pack --- she did better x 1 day.  The next day she felt much weaker (couldn't hold head up) and had shakiness, blurred vision and presyncope.   She received a steroid pack and she finished that a couple days ago.  She went to the emergency room 12/01/2021 due to the symptoms the next day  Besides a headache she has blurred vision, shaky hands, nausea and near syncope.  CT of the head and CTA personally reviewed and are normal   Sinuses fine.      She was on Aimovig in the past and did well x 3 months and then stopped due to stomach issues.    It helped some but no completely.     Prophylactic medications:   topiramate (no benefit and had had s.e.); nortriptyline (also affected stomach and no definite benefit.   Muscle relaxant did not help   She has not  been on Depakote or levetiracetam.     Botox was not approved.    Acute Rx:   NSAIDs don't help,   steroids sometimes help.    Maxalt helped in ast, not recently, Nurtec helps sometimes.     Currently, she has a daily headache over the last 8 days.    Pain is located in the left forehead and the left occiput.   The pain started with throbbing pain on the left associated with nasuea and vomiting, photophobia and phonophobia.   Moving worsened the pain.  Laying in ned slightly helps.     Maxalt and Aleve which usually helped did not help at all (has tried several times) The next day, she woke up and when she got out of bed she was lightheaded and passed out.   She has a ringing in her left ear. She has mostly stayed in bed and tried tramadol without benefit.   She was placed on prednisone on Monday and phenergan yesterday.   Pain is fluctuating between moderate and severe and she feels.   Topamax was started a few days ago.   She took it in the past and it helped but was poorly tolerated.    Current headache is longest one ever experienced (others would last 1-2 days).  She has a long history of episodic migraines. She often gets visual aura appear they are always associated with nausea and usually with vomiting. She always has photophobia and phonophobia. Pain is usually better if she lays down in a dark room.  At times they were rare but this year they are occurring weekly for one or two days each.   Aleve often helps.  Maxalt helps if Aleve does not.      I personally reviewed the MRI of the brain dated 10/02/2013. 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   REVIEW OF SYSTEMS: Constitutional: No fevers, chills, sweats, or change in appetite Eyes: No visual changes, double vision, eye pain Ear, nose and throat: No hearing loss, ear pain, nasal congestion, sore throat Cardiovascular: No chest pain, palpitations Respiratory:  No shortness of breath at rest or with  exertion.   No wheezes GastrointestinaI: No nausea, vomiting, diarrhea, abdominal pain, fecal incontinence Genitourinary:  No dysuria, urinary retention or frequency.  No nocturia. Musculoskeletal:  No neck pain, back pain Integumentary: No rash, pruritus, skin lesions Neurological: as above Psychiatric: No depression at this time.  No anxiety Endocrine: No palpitations, diaphoresis, change in appetite, change in weigh or increased thirst Hematologic/Lymphatic:  No anemia, purpura, petechiae. Allergic/Immunologic: No itchy/runny eyes, nasal congestion, recent allergic reactions, rashes  ALLERGIES: Allergies  Allergen Reactions   Hydrocodone Bit-Homatrop Mbr Nausea And Vomiting and Rash    Hycodan    Percocet [Oxycodone-Acetaminophen] Hives and Rash    HOME MEDICATIONS:  Current Outpatient Medications:    albuterol (VENTOLIN HFA) 108 (90 Base) MCG/ACT inhaler, Inhale 2 puffs into the lungs every 6 (six) hours as needed for wheezing or shortness of breath., Disp: 8 g, Rfl: 0   citalopram (CELEXA) 20 MG tablet, TAKE 1 TABLET(20 MG) BY MOUTH AT BEDTIME, Disp: 30 tablet, Rfl: 2   clonazePAM (KLONOPIN) 0.5 MG tablet, TAKE 1/2 TO 1 (ONE-HALF TO ONE) TABLET BY MOUTH TWICE DAILY AS NEEDED FOR ANXIETY, Disp: 24 tablet, Rfl: 1   cyclobenzaprine (FLEXERIL) 10 MG tablet, Take 10 mg by mouth at bedtime as needed for muscle spasms., Disp: , Rfl:    fludrocortisone (FLORINEF) 0.1 MG tablet, Take 1 tablet by mouth once daily, Disp: 90 tablet, Rfl: 1   Galcanezumab-gnlm (EMGALITY) 120 MG/ML SOAJ, Inject 1 pen into the skin every 28 (twenty-eight) days., Disp: 3 mL, Rfl: 4   NURTEC 75 MG TBDP, TAKE 1 TABLET BY MOUTH AT ONSET OF MIGRAINE AND MAY REPEAT 1 TABLET IN 24 HOUR, Disp: 8 tablet, Rfl: 11   ORILISSA 150 MG TABS, Take 150 mg by mouth at bedtime., Disp: , Rfl:    pantoprazole (PROTONIX) 40 MG tablet, TAKE 1 TABLET(40 MG) BY MOUTH DAILY, Disp: 30 tablet, Rfl: 5   predniSONE (DELTASONE) 5 MG tablet,  Begin taking 6 tablets daily, taper by one tablet daily until off the medication., Disp: 21 tablet, Rfl: 0   dicyclomine (BENTYL) 10 MG capsule, Take 1 capsule (10 mg total) by mouth 4 (four) times daily -  before meals and at bedtime. (Patient taking differently: Take 10 mg by mouth 4 (four) times daily -  before meals and at bedtime. As needed), Disp: 120 capsule, Rfl: 5  PAST MEDICAL HISTORY: Past Medical History:  Diagnosis Date   Anemia    Anxiety    B12 deficiency 02/10/2021   B12 low end of normal patient with significant fatigue will go ahead and treat April 2020   Depression  denies   Endometriosis    Hx of varicella    IBS (irritable bowel syndrome)    Infection    UTI   Migraines    with aura   Migraines    PONV (postoperative nausea and vomiting)     PAST SURGICAL HISTORY: Past Surgical History:  Procedure Laterality Date   BIOPSY  09/11/2020   Procedure: BIOPSY;  Surgeon: Eloise Harman, DO;  [facility];  Service: Endoscopy;;   CHOLECYSTECTOMY N/A 11/18/2019   Procedure: LAPAROSCOPIC CHOLECYSTECTOMY;  Surgeon: Virl Cagey, MD;  Location: [facility];  Service: General;  Laterality: N/A;   COLONOSCOPY WITH PROPOFOL N/A 09/11/2020   Procedure: COLONOSCOPY WITH PROPOFOL;  Surgeon: Eloise Harman, DO;  [facility];  Service: Endoscopy;  Laterality: N/A;  10:30am   ESOPHAGOGASTRODUODENOSCOPY (EGD) WITH PROPOFOL N/A 09/11/2020   Procedure: ESOPHAGOGASTRODUODENOSCOPY (EGD) WITH PROPOFOL;  Surgeon: Eloise Harman, DO;  [facility];  Service: Endoscopy;  Laterality: N/A;   LAPAROSCOPY     with chromopertubation   OTHER SURGICAL HISTORY     dx lap for endometriosis    FAMILY HISTORY: Family History  Problem Relation Age of Onset   Fibromyalgia Mother    Diabetes Mother        borderline   Depression Father    Diabetes Father        type 2   Colon polyps Father        >47   Anxiety disorder Sister    Crohn's disease  Sister    Bipolar disorder Maternal Aunt    Cancer Maternal Grandmother        mouth and lung   Bipolar disorder Maternal Grandfather    Stroke Maternal Grandfather    Migraines Maternal Grandfather    Heart disease Paternal Grandfather    Colon cancer Cousin        maternal   Celiac disease Neg Hx     SOCIAL HISTORY:  Social History   Socioeconomic History   Marital status: Married    Spouse name: Not on file   Number of children: Not on file   Years of education: Not on file   Highest education level: Not on file  Occupational History   Not on file  Tobacco Use   Smoking status: Never   Smokeless tobacco: Never  Vaping Use   Vaping Use: Never used  Substance and Sexual Activity   Alcohol use: No   Drug use: No   Sexual activity: Yes    Birth control/protection: None  Other Topics Concern   Not on file  Social History Narrative   Not on file   Social Determinants of Health   Financial Resource Strain: Not on file  Food Insecurity: Not on file  Transportation Needs: Not on file  Physical Activity: Not on file  Stress: Not on file  Social Connections: Not on file  Intimate Partner Violence: Not on file     PHYSICAL EXAM  Vitals:   12/08/21 0902  BP: 108/74  Pulse: 96  SpO2: 98%  Weight: 148 lb 8 oz (67.4 kg)  Height: 5\' 3"  (1.6 m)    Body mass index is 26.31 kg/m.   General: The patient is well-developed and well-nourished and in no acute distress.  There is only minimal tenderness at the occiput.  Good range of motion of the neck  Eyes:  Funduscopic exam shows normal optic discs and retinal vessels.  Venous pulsations are present.  Skin: Extremities are without signifi  Neurologic Exam  Mental status: The patient is alert and oriented x 3 at the time of the examination. The patient has apparent normal recent and remote memory, with an apparently normal attention span and concentration ability.   Speech is normal.  Cranial nerves:  Extraocular movements are full. Pupils are equal, round, and reactive to light and accomodation.  Facial strength and sensation was normal.. No obvious hearing deficits are noted.  Motor:  Muscle bulk is normal.   Tone is normal. Strength is  5 / 5 in all 4 extremities.   Sensory: Sensory testing is intact to pinprick, soft touch and vibration sensation in all 4 extremities.  Coordination: Cerebellar testing reveals good finger-nose-finger and heel-to-shin bilaterally.  Gait and station: Station is normal.   Gait is normal. Tandem gait is normal. Romberg is negative.    lantar responses are flexor.    DIAGNOSTIC DATA (LABS, IMAGING, TESTING) - I reviewed patient records, labs, notes, testing and imaging myself where available.  Lab Results  Component Value Date   WBC 8.6 12/01/2021   HGB 13.9 12/01/2021   HCT 41.4 12/01/2021   MCV 94.1 12/01/2021   PLT 278 12/01/2021    Lab Results  Component Value Date   TSH 1.600 12/04/2020       ASSESSMENT AND PLAN  Intractable chronic migraine without aura and without status migrainosus  Brain fog  Lightheadedness  Emgality loading dose (240 mg [id number] F 08/2023), then 120 mg q4 weeks.  Ok to take Nurtec prn.   Consider Botox if not better. Call next week if not better or sooner if more neurologic symptoms.  She has an appointment in June.  She should keep this but call sooner if new or worsening neurologic symptoms.   Danial Hlavac A. Felecia Shelling, MD, [facility] [id number], [id number] AM Certified in Neurology, Clinical Neurophysiology, Sleep Medicine, Pain Medicine and Neuroimaging  [facility] [address] [place], [place] [address] [phone]

## 2021-12-12 ENCOUNTER — Encounter: Payer: Self-pay | Admitting: General Surgery

## 2021-12-12 NOTE — Patient Instructions (Signed)
Discussed options on phone.

## 2021-12-13 ENCOUNTER — Telehealth: Payer: Self-pay

## 2021-12-13 NOTE — Telephone Encounter (Signed)
noted 

## 2021-12-13 NOTE — Telephone Encounter (Signed)
Pt called back LMOVM stating she needs to know what her next steps are in her treatment. She has seen OBGYN and Dr. Henreitta Leber and everything is good from their perspective and pt wants a follow-up with you and she phone but your schedule isn't  open for March. Pt is trying to see you soon and or speak with you. Pt can be reached by phone and her MyChart. Please advise if you want a space opened up for her to be re-evaluated. Please advise.

## 2021-12-13 NOTE — Telephone Encounter (Signed)
Pt called wanting an appointment with Dr. Marletta Lor to follow up from Gen Surgery referral. Pt will need to be put on waiting list for Dr. Queen Blossom March schedule as well as put on his cancellation list.

## 2021-12-13 NOTE — Telephone Encounter (Signed)
Per Dr. Marletta Lor  I spoke with the pt and she wants to come in on 2/22 in his urgent spot. Please put on the books.

## 2021-12-13 NOTE — Telephone Encounter (Signed)
See if she can come 12/29/21 at 1330 urgent spot

## 2021-12-13 NOTE — Telephone Encounter (Signed)
Phoned the pt back and LMOVM for the pt to return call

## 2021-12-13 NOTE — Telephone Encounter (Signed)
Pt has been advised of her appt date and time

## 2021-12-20 ENCOUNTER — Encounter: Payer: Self-pay | Admitting: General Surgery

## 2021-12-20 ENCOUNTER — Other Ambulatory Visit (INDEPENDENT_AMBULATORY_CARE_PROVIDER_SITE_OTHER): Payer: 59 | Admitting: General Surgery

## 2021-12-20 DIAGNOSIS — K389 Disease of appendix, unspecified: Secondary | ICD-10-CM

## 2021-12-20 NOTE — Progress Notes (Signed)
[facility]  Discussed with Dr.Eure he will be available. Will plan for 2/15 for diagnostic laparoscopy, appendectomy. Eure available if needed pending any endometriosis.   Algis Greenhouse, MD [facility] [address] [place], [place] [address] [phone] (office)

## 2021-12-21 ENCOUNTER — Encounter (HOSPITAL_COMMUNITY)
Admission: RE | Admit: 2021-12-21 | Discharge: 2021-12-21 | Disposition: A | Discharge status: Home / Self Care | Payer: 59 | Source: Ambulatory Visit | Attending: General Surgery | Admitting: General Surgery

## 2021-12-21 DIAGNOSIS — Z01818 Encounter for other preprocedural examination: Secondary | ICD-10-CM

## 2021-12-21 DIAGNOSIS — Z01812 Encounter for preprocedural laboratory examination: Secondary | ICD-10-CM | POA: Diagnosis present

## 2021-12-21 LAB — PREGNANCY, URINE: Preg Test, Ur: NEGATIVE

## 2021-12-21 NOTE — Patient Instructions (Signed)
Stacey Compton  12/21/2021     @PREFPERIOPPHARMACY @   Your procedure is scheduled on  12/22/2021.   Report to [name] at  0730 A.M.   Call this number if you have problems the morning of surgery:  [phone]   Remember:  Do not eat or drink after midnight.      Take these medicines the morning of surgery with A SIP OF WATER        clonazepam, nurtec, protonix, zanaflex(if needed), tramadol(if needed).    Do not wear jewelry, make-up or nail polish.  Do not wear lotions, powders, or perfumes, or deodorant.  Do not shave 48 hours prior to surgery.  Men may shave face and neck.  Do not bring valuables to the hospital.  [facility] is not responsible for any belongings or valuables.  Contacts, dentures or bridgework may not be worn into surgery.  Leave your suitcase in the car.  After surgery it may be brought to your room.  For patients admitted to the hospital, discharge time will be determined by your treatment team.  Patients discharged the day of surgery will not be allowed to drive home and must have someone with them for 24 hours.    Special instructions:   DO NOT smoke tobacco or vape for 24 hours before your procedure.  Please read over the following fact sheets that you were given. Coughing and Deep Breathing, Surgical Site Infection Prevention, Anesthesia Post-op Instructions, and Care and Recovery After Surgery      Diagnostic Laparoscopy, Care After The following information offers guidance on how to care for yourself after your procedure. Your health care provider may also give you more specific instructions. If you have problems or questions, contact your health care provider. What can I expect after the procedure? After the procedure, it is common to have: Mild discomfort in the abdomen. Sore throat. Women who have laparoscopy with a pelvic examination may have mild cramping and fluid coming from the vagina for a few days after the  procedure. Follow these instructions at home: Medicines Take over-the-counter and prescription medicines only as told by your health care provider. If you were prescribed an antibiotic medicine, take it as told by your health care provider. Do not stop taking the antibiotic even if you start to feel better. Ask your health care provider if the medicine prescribed to you: Requires you to avoid driving or using machinery. Can cause constipation. You may need to take these actions to prevent or treat constipation: Drink enough fluid to keep your urine pale yellow. Take over-the-counter or prescription medicines. Eat foods that are high in fiber, such as beans, whole grains, and fresh fruits and vegetables. Limit foods that are high in fat and processed sugars, such as fried or sweet foods. Incision care  Follow instructions from your health care provider about how to take care of your incisions. Make sure you: Wash your hands with soap and water for at least 20 seconds before and after you change your bandage (dressing). If soap and water are not available, use hand sanitizer. Change your dressing as told by your health care provider. Leave stitches (sutures), skin glue, or surgical tape in place. These skin closures may need to stay in place for 2 weeks or longer. If surgical tape edges start to loosen and curl up, you may trim the loose edges. Do not remove the surgical tape completely unless your health care provider  tells you to do that. Check your incision areas every day for signs of infection. Check for: Redness, swelling, or pain. Fluid or blood. Warmth. Pus or a bad smell. Activity Return to your normal activities as told by your health care provider. Ask your health care provider what activities are safe for you. Do not lift anything that is heavier than 10 lb (4.5 kg), or the limit that you are told, until your health care provider says that it is safe. Avoid sitting for a long time  without moving. Get up to take short walks every 1-2 hours. This is important to improve blood flow and breathing. Ask for help if you feel weak or unsteady. General instructions Do not use any products that contain nicotine or tobacco. These products include cigarettes, chewing tobacco, and vaping devices, such as e-cigarettes. If you need help quitting, ask your health care provider. If you were given a sedative during the procedure, it can affect you for several hours. Do not drive or operate machinery until your health care provider says that it is safe. Do not take baths, swim, or use a hot tub until your health care provider approves. Ask your health care provider if you may take showers. You may only be allowed to take sponge baths. Keep all follow-up visits. This is important. Contact a health care provider if: You develop shoulder pain. You feel light-headed or faint. You are unable to pass gas or have a bowel movement. You feel nauseous or you vomit. You develop a rash. You have any of these signs of infection: Redness, swelling, or pain around an incision. Fluid or blood coming from an incision. Warmth coming from an incision. Pus or a bad smell coming from an incision. A fever or chills. Get help right away if: You have severe pain. You have vomiting that does not go away. You have heavy bleeding from the vagina. Any incision opens up. You have trouble breathing. You have chest pain. These symptoms may represent a serious problem that is an emergency. Do not wait to see if the symptoms will go away. Get medical help right away. Call your local emergency services (911 in the [place] Do not drive yourself to the hospital. Summary After the procedure, it is common to have mild discomfort in the abdomen and a sore throat. Check your incision areas every day for signs of infection. Return to your normal activities as told by your health care provider. Ask your health care provider  what activities are safe for you. This information is not intended to replace advice given to you by your health care provider. Make sure you discuss any questions you have with your health care provider. Document Revised: 06/19/2020 Document Reviewed: 06/19/2020 Elsevier Patient Education  2022 [place] Anesthesia, Adult, Care After This sheet gives you information about how to care for yourself after your procedure. Your health care provider may also give you more specific instructions. If you have problems or questions, contact your health care provider. What can I expect after the procedure? After the procedure, the following side effects are common: Pain or discomfort at the IV site. Nausea. Vomiting. Sore throat. Trouble concentrating. Feeling cold or chills. Feeling weak or tired. Sleepiness and fatigue. Soreness and body aches. These side effects can affect parts of the body that were not involved in surgery. Follow these instructions at home: For the time period you were told by your health care provider:  Rest. Do not participate in activities  where you could fall or become injured. Do not drive or use machinery. Do not drink alcohol. Do not take sleeping pills or medicines that cause drowsiness. Do not make important decisions or sign legal documents. Do not take care of children on your own. Eating and drinking Follow any instructions from your health care provider about eating or drinking restrictions. When you feel hungry, start by eating small amounts of foods that are soft and easy to digest (bland), such as toast. Gradually return to your regular diet. Drink enough fluid to keep your urine pale yellow. If you vomit, rehydrate by drinking water, juice, or clear broth. General instructions If you have sleep apnea, surgery and certain medicines can increase your risk for breathing problems. Follow instructions from your health care provider about wearing  your sleep device: Anytime you are sleeping, including during daytime naps. While taking prescription pain medicines, sleeping medicines, or medicines that make you drowsy. Have a responsible adult stay with you for the time you are told. It is important to have someone help care for you until you are awake and alert. Return to your normal activities as told by your health care provider. Ask your health care provider what activities are safe for you. Take over-the-counter and prescription medicines only as told by your health care provider. If you smoke, do not smoke without supervision. Keep all follow-up visits as told by your health care provider. This is important. Contact a health care provider if: You have nausea or vomiting that does not get better with medicine. You cannot eat or drink without vomiting. You have pain that does not get better with medicine. You are unable to pass urine. You develop a skin rash. You have a fever. You have redness around your IV site that gets worse. Get help right away if: You have difficulty breathing. You have chest pain. You have blood in your urine or stool, or you vomit blood. Summary After the procedure, it is common to have a sore throat or nausea. It is also common to feel tired. Have a responsible adult stay with you for the time you are told. It is important to have someone help care for you until you are awake and alert. When you feel hungry, start by eating small amounts of foods that are soft and easy to digest (bland), such as toast. Gradually return to your regular diet. Drink enough fluid to keep your urine pale yellow. Return to your normal activities as told by your health care provider. Ask your health care provider what activities are safe for you. This information is not intended to replace advice given to you by your health care provider. Make sure you discuss any questions you have with your health care provider. Document  Revised: 07/09/2020 Document Reviewed: 02/06/2020 Elsevier Patient Education  2022 [place]. How to Use Chlorhexidine for Bathing Chlorhexidine gluconate (CHG) is a germ-killing (antiseptic) solution that is used to clean the skin. It can get rid of the bacteria that normally live on the skin and can keep them away for about 24 hours. To clean your skin with CHG, you may be given: A CHG solution to use in the shower or as part of a sponge bath. A prepackaged cloth that contains CHG. Cleaning your skin with CHG may help lower the risk for infection: While you are staying in the intensive care unit of the hospital. If you have a vascular access, such as a central line, to provide short-term or long-term  access to your veins. If you have a catheter to drain urine from your bladder. If you are on a ventilator. A ventilator is a machine that helps you breathe by moving air in and out of your lungs. After surgery. What are the risks? Risks of using CHG include: A skin reaction. Hearing loss, if CHG gets in your ears and you have a perforated eardrum. Eye injury, if CHG gets in your eyes and is not rinsed out. The CHG product catching fire. Make sure that you avoid smoking and flames after applying CHG to your skin. Do not use CHG: If you have a chlorhexidine allergy or have previously reacted to chlorhexidine. On babies younger than 84 months of age. How to use CHG solution Use CHG only as told by your health care provider, and follow the instructions on the label. Use the full amount of CHG as directed. Usually, this is one bottle. During a shower Follow these steps when using CHG solution during a shower (unless your health care provider gives you different instructions): Start the shower. Use your normal soap and shampoo to wash your face and hair. Turn off the shower or move out of the shower stream. Pour the CHG onto a clean washcloth. Do not use any type of brush or rough-edged  sponge. Starting at your neck, lather your body down to your toes. Make sure you follow these instructions: If you will be having surgery, pay special attention to the part of your body where you will be having surgery. Scrub this area for at least 1 minute. Do not use CHG on your head or face. If the solution gets into your ears or eyes, rinse them well with water. Avoid your genital area. Avoid any areas of skin that have broken skin, cuts, or scrapes. Scrub your back and under your arms. Make sure to wash skin folds. Let the lather sit on your skin for 1-2 minutes or as long as told by your health care provider. Thoroughly rinse your entire body in the shower. Make sure that all body creases and crevices are rinsed well. Dry off with a clean towel. Do not put any substances on your body afterward--such as powder, lotion, or perfume--unless you are told to do so by your health care provider. Only use lotions that are recommended by the manufacturer. Put on clean clothes or pajamas. If it is the night before your surgery, sleep in clean sheets.  During a sponge bath Follow these steps when using CHG solution during a sponge bath (unless your health care provider gives you different instructions): Use your normal soap and shampoo to wash your face and hair. Pour the CHG onto a clean washcloth. Starting at your neck, lather your body down to your toes. Make sure you follow these instructions: If you will be having surgery, pay special attention to the part of your body where you will be having surgery. Scrub this area for at least 1 minute. Do not use CHG on your head or face. If the solution gets into your ears or eyes, rinse them well with water. Avoid your genital area. Avoid any areas of skin that have broken skin, cuts, or scrapes. Scrub your back and under your arms. Make sure to wash skin folds. Let the lather sit on your skin for 1-2 minutes or as long as told by your health care  provider. Using a different clean, wet washcloth, thoroughly rinse your entire body. Make sure that all body creases and  crevices are rinsed well. Dry off with a clean towel. Do not put any substances on your body afterward--such as powder, lotion, or perfume--unless you are told to do so by your health care provider. Only use lotions that are recommended by the manufacturer. Put on clean clothes or pajamas. If it is the night before your surgery, sleep in clean sheets. How to use CHG prepackaged cloths Only use CHG cloths as told by your health care provider, and follow the instructions on the label. Use the CHG cloth on clean, dry skin. Do not use the CHG cloth on your head or face unless your health care provider tells you to. When washing with the CHG cloth: Avoid your genital area. Avoid any areas of skin that have broken skin, cuts, or scrapes. Before surgery Follow these steps when using a CHG cloth to clean before surgery (unless your health care provider gives you different instructions): Using the CHG cloth, vigorously scrub the part of your body where you will be having surgery. Scrub using a back-and-forth motion for 3 minutes. The area on your body should be completely wet with CHG when you are done scrubbing. Do not rinse. Discard the cloth and let the area air-dry. Do not put any substances on the area afterward, such as powder, lotion, or perfume. Put on clean clothes or pajamas. If it is the night before your surgery, sleep in clean sheets.  For general bathing Follow these steps when using CHG cloths for general bathing (unless your health care provider gives you different instructions). Use a separate CHG cloth for each area of your body. Make sure you wash between any folds of skin and between your fingers and toes. Wash your body in the following order, switching to a new cloth after each step: The front of your neck, shoulders, and chest. Both of your arms, under your  arms, and your hands. Your stomach and groin area, avoiding the genitals. Your right leg and foot. Your left leg and foot. The back of your neck, your back, and your buttocks. Do not rinse. Discard the cloth and let the area air-dry. Do not put any substances on your body afterward--such as powder, lotion, or perfume--unless you are told to do so by your health care provider. Only use lotions that are recommended by the manufacturer. Put on clean clothes or pajamas. Contact a health care provider if: Your skin gets irritated after scrubbing. You have questions about using your solution or cloth. You swallow any chlorhexidine. Call your local poison control center (1-781-380-8211 in the [place] Get help right away if: Your eyes itch badly, or they become very red or swollen. Your skin itches badly and is red or swollen. Your hearing changes. You have trouble seeing. You have swelling or tingling in your mouth or throat. You have trouble breathing. These symptoms may represent a serious problem that is an emergency. Do not wait to see if the symptoms will go away. Get medical help right away. Call your local emergency services (911 in the [place] Do not drive yourself to the hospital. Summary Chlorhexidine gluconate (CHG) is a germ-killing (antiseptic) solution that is used to clean the skin. Cleaning your skin with CHG may help to lower your risk for infection. You may be given CHG to use for bathing. It may be in a bottle or in a prepackaged cloth to use on your skin. Carefully follow your health care provider's instructions and the instructions on the product label. Do not use  CHG if you have a chlorhexidine allergy. Contact your health care provider if your skin gets irritated after scrubbing. This information is not intended to replace advice given to you by your health care provider. Make sure you discuss any questions you have with your health care provider. Document Revised: 01/04/2021  Document Reviewed: 01/04/2021 Elsevier Patient Education  2022 [employer].

## 2021-12-21 NOTE — H&P (Signed)
[facility] History and Physical   Reason for Referral: Appendicolith, RLQ pain  Referring Physician:  Dr. Marletta Lor    Chief Complaint   Office Visit        Stacey Compton is a 36 y.o. female.  HPI: Stacey Compton is a 36 yo who I took her gallbladder out a few years ago. For the last 6 weeks or so she has been having constant, sharp pain in her right side. This was not associated with any injury or exercise. She says that the pain is getting worse and is affecting her daily life and preventing her from interacting with her children. She has been seen by GI and her GYN. She was noted to have a possible appendicolith on CT and was sent to me to discuss appendectomy. She is otherwise doing and is not constipated after taking some medication after her CT demonstrated stool burden. She had an [place] per report with her Gyn, [facility], and she indicates this was within normal limits. She denies any fevers or chills. She has not nausea or vomiting. She just feels like the pain is consant and is on her right flank lower abdomen in approximately the location where her appendix is on her 2021 CT.    She had surgery for endometriosis in the past but says this pain is different and is not cyclic. She does not have a period and has the implant in her arm for birth control/ menstruation.    She has IBS and takes bentyl. She says this is different than that has been in the past. She has not personal or family history of Crohn's.    Her [facility] CT is not available on my Epic.       Past Medical History:  Diagnosis Date   Anemia     Anxiety     B12 deficiency 02/10/2021    B12 low end of normal patient with significant fatigue will go ahead and treat April 2020   Depression      denies   Endometriosis     Hx of varicella     IBS (irritable bowel syndrome)     Infection      UTI   Migraines      with aura   Migraines     PONV (postoperative nausea and vomiting)              Past Surgical History:  Procedure Laterality Date   BIOPSY   09/11/2020    Procedure: BIOPSY;  Surgeon: Lanelle Bal, DO;  Location: [facility];  Service: Endoscopy;;   CHOLECYSTECTOMY N/A 11/18/2019    Procedure: LAPAROSCOPIC CHOLECYSTECTOMY;  Surgeon: [name], MD;  Location: [facility];  Service: General;  Laterality: N/A;   COLONOSCOPY WITH PROPOFOL N/A 09/11/2020    Procedure: COLONOSCOPY WITH PROPOFOL;  Surgeon: Lanelle Bal, DO;  Location: [facility];  Service: Endoscopy;  Laterality: N/A;  10:30am   ESOPHAGOGASTRODUODENOSCOPY (EGD) WITH PROPOFOL N/A 09/11/2020    Procedure: ESOPHAGOGASTRODUODENOSCOPY (EGD) WITH PROPOFOL;  Surgeon: Lanelle Bal, DO;  [facility]: [facility];  Service: Endoscopy;  Laterality: N/A;   LAPAROSCOPY        with chromopertubation   OTHER SURGICAL HISTORY        dx lap for endometriosis           Family History  Problem Relation Age of Onset   Fibromyalgia Mother     Diabetes Mother  borderline   Depression Father     Diabetes Father          type 2   Colon polyps Father          >20   Anxiety disorder Sister     Crohn's disease Sister     Bipolar disorder Maternal Aunt     Cancer Maternal Grandmother          mouth and lung   Bipolar disorder Maternal Grandfather     Stroke Maternal Grandfather     Migraines Maternal Grandfather     Heart disease Paternal Grandfather     Colon cancer Cousin          maternal   Celiac disease Neg Hx        Social History        Tobacco Use   Smoking status: Never   Smokeless tobacco: Never  Vaping Use   Vaping Use: Never used  Substance Use Topics   Alcohol use: No   Drug use: No      Medications: I have reviewed the patient's current medications. Allergies as of 12/08/2021         Reactions    Hydrocodone Bit-homatrop Mbr Nausea And Vomiting, Rash    Hycodan     Percocet [oxycodone-acetaminophen] Hives, Rash            Medication List            Accurate as of December 08, 2021  3:45 PM. If you have any questions, ask your nurse or doctor.              albuterol 108 (90 Base) MCG/ACT inhaler Commonly known as: VENTOLIN HFA Inhale 2 puffs into the lungs every 6 (six) hours as needed for wheezing or shortness of breath.    citalopram 20 MG tablet Commonly known as: CELEXA TAKE 1 TABLET(20 MG) BY MOUTH AT BEDTIME    clonazePAM 0.5 MG tablet Commonly known as: KLONOPIN TAKE 1/2 TO 1 (ONE-HALF TO ONE) TABLET BY MOUTH TWICE DAILY AS NEEDED FOR ANXIETY    cyclobenzaprine 10 MG tablet Commonly known as: FLEXERIL Take 10 mg by mouth at bedtime as needed for muscle spasms.    dicyclomine 10 MG capsule Commonly known as: BENTYL Take 1 capsule (10 mg total) by mouth 4 (four) times daily -  before meals and at bedtime. What changed: additional instructions    Emgality 120 MG/ML Soaj Generic drug: Galcanezumab-gnlm Inject 1 pen into the skin every 28 (twenty-eight) days. Started by: Britt Bottom, MD    fludrocortisone 0.1 MG tablet Commonly known as: FLORINEF Take 1 tablet by mouth once daily    Nurtec 75 MG Tbdp Generic drug: Rimegepant Sulfate TAKE 1 TABLET BY MOUTH AT ONSET OF MIGRAINE AND MAY REPEAT 1 TABLET IN 24 HOUR    Orilissa 150 MG Tabs Generic drug: Elagolix Sodium Take 150 mg by mouth at bedtime.    pantoprazole 40 MG tablet Commonly known as: PROTONIX TAKE 1 TABLET(40 MG) BY MOUTH DAILY    predniSONE 5 MG tablet Commonly known as: DELTASONE Begin taking 6 tablets daily, taper by one tablet daily until off the medication.               ROS:  A comprehensive review of systems was negative except for: Gastrointestinal: positive for abdominal pain and reflux symptoms Neurological: positive for dizziness   Blood pressure 107/75, pulse 94, temperature 97.9 F (36.6 C), temperature source Other (Comment),  resp. rate 16, height 5\' 3"  (1.6 m), weight 150 lb (68 kg), SpO2 98 %, currently  breastfeeding. Physical Exam Vitals reviewed.  Constitutional:      Appearance: Normal appearance.  HENT:     Head: Atraumatic.     Nose: Nose normal.     Mouth/Throat:     Mouth: Mucous membranes are moist.  Eyes:     Extraocular Movements: Extraocular movements intact.  Cardiovascular:     Rate and Rhythm: Normal rate.  Pulmonary:     Effort: Pulmonary effort is normal.     Breath sounds: Normal breath sounds.  Abdominal:     General: There is no distension.     Palpations: Abdomen is soft.     Tenderness: There is abdominal tenderness in the right lower quadrant.     Comments: Right flank area  Musculoskeletal:        General: Normal range of motion.     Cervical back: Normal range of motion.  Skin:    General: Skin is warm.  Neurological:     General: No focal deficit present.     Mental Status: She is alert and oriented to person, place, and time.  Psychiatric:        Mood and Affect: Mood normal.        Behavior: Behavior normal.      Results: Personally reviewed CT with radiology from 11/2021 and old CT from 2021, able to get [facility] imaging with radiology PACS.  Appendix in 2021 is not dilated or inflamed, oral contrast in the lumen, 2023 there is some contrast material versus an appendicolith in the appendix, it is in the right flank on the lateral part of the cecum, the appendix is about 4 mm in diameter, and there is no obvious associated inflammation or stranding, no obvious endometrial implants      Assessment & Plan:  Stacey Compton is a 36 y.o. female with RLQ pain that is worsening and affecting her daily life. I have reviewed imaging with radiology after our visit and discussed the findings with the patient via phone call.  I cannot tell her that this is an appendicolith or old contrast. I do not see any signs of dilation or inflammation. She could have pain from some other source, endometrial implant that is not seen or from the appendix. The appendix  is located in the area where she is hurting.    Discussed diagnostic laparoscopy and appendectomy to rule out this being the issue. Discussed that I will talk to Dr. Elonda Husky, Gyn about the case and his thoughts on the endometriosis and implants and if he would be available to assist if needed. She is good with this plan.   She is going to think about the options and let us know. I told her it is fine to wait a few more weeks/ months to do anything like this and that is essentially up to her based on her pain and how it is affecting her daily life.    Discussed that her pain may not improve at all and that there is risk of bleeding, infection, injury to other organs, finding nothing, and anesthesia.    All questions were answered to the satisfaction of the patient.   She will call and update [place] on her decisions. Message sent to Dr.Eure.    Virl Cagey 12/08/2021, 3:45 PM      Discussed with Dr. Elonda Husky. He will be available. Patient wants to proceed  with diagnostic laparoscopy, appendectomy.

## 2021-12-22 ENCOUNTER — Other Ambulatory Visit: Payer: Self-pay

## 2021-12-22 ENCOUNTER — Encounter (HOSPITAL_COMMUNITY)
Admission: RE | Disposition: A | Discharge status: Home / Self Care | Payer: Self-pay | Source: Home / Self Care | Attending: General Surgery

## 2021-12-22 ENCOUNTER — Encounter (HOSPITAL_COMMUNITY): Payer: Self-pay | Admitting: General Surgery

## 2021-12-22 ENCOUNTER — Ambulatory Visit (HOSPITAL_COMMUNITY): Payer: 59 | Admitting: Certified Registered"

## 2021-12-22 ENCOUNTER — Ambulatory Visit (HOSPITAL_BASED_OUTPATIENT_CLINIC_OR_DEPARTMENT_OTHER): Payer: 59 | Admitting: Certified Registered"

## 2021-12-22 ENCOUNTER — Ambulatory Visit (HOSPITAL_COMMUNITY)
Admission: RE | Admit: 2021-12-22 | Discharge: 2021-12-22 | Disposition: A | Discharge status: Home / Self Care | Payer: 59 | Attending: General Surgery | Admitting: General Surgery

## 2021-12-22 DIAGNOSIS — Z793 Long term (current) use of hormonal contraceptives: Secondary | ICD-10-CM | POA: Diagnosis not present

## 2021-12-22 DIAGNOSIS — Z9049 Acquired absence of other specified parts of digestive tract: Secondary | ICD-10-CM | POA: Diagnosis not present

## 2021-12-22 DIAGNOSIS — K388 Other specified diseases of appendix: Secondary | ICD-10-CM

## 2021-12-22 DIAGNOSIS — K389 Disease of appendix, unspecified: Secondary | ICD-10-CM | POA: Diagnosis not present

## 2021-12-22 DIAGNOSIS — F32A Depression, unspecified: Secondary | ICD-10-CM | POA: Diagnosis not present

## 2021-12-22 DIAGNOSIS — K219 Gastro-esophageal reflux disease without esophagitis: Secondary | ICD-10-CM | POA: Insufficient documentation

## 2021-12-22 DIAGNOSIS — R1031 Right lower quadrant pain: Secondary | ICD-10-CM | POA: Diagnosis not present

## 2021-12-22 DIAGNOSIS — G8929 Other chronic pain: Secondary | ICD-10-CM

## 2021-12-22 DIAGNOSIS — F419 Anxiety disorder, unspecified: Secondary | ICD-10-CM | POA: Diagnosis not present

## 2021-12-22 DIAGNOSIS — K381 Appendicular concretions: Secondary | ICD-10-CM | POA: Diagnosis present

## 2021-12-22 DIAGNOSIS — Z79899 Other long term (current) drug therapy: Secondary | ICD-10-CM | POA: Insufficient documentation

## 2021-12-22 DIAGNOSIS — K589 Irritable bowel syndrome without diarrhea: Secondary | ICD-10-CM | POA: Insufficient documentation

## 2021-12-22 HISTORY — PX: LAPAROSCOPY: SHX197

## 2021-12-22 SURGERY — LAPAROSCOPY, DIAGNOSTIC
Anesthesia: General | Site: Abdomen

## 2021-12-22 MED ORDER — ONDANSETRON HCL 4 MG/2ML IJ SOLN: 4.0000 mg | Freq: Once | INTRAMUSCULAR | Status: DC | PRN

## 2021-12-22 MED ORDER — MIDAZOLAM HCL 2 MG/2ML IJ SOLN
INTRAMUSCULAR | Status: AC
  Filled 2021-12-22: qty 2

## 2021-12-22 MED ORDER — LIDOCAINE 2% (20 MG/ML) 5 ML SYRINGE
INTRAMUSCULAR | Status: DC | PRN
  Administered 2021-12-22: 100 mg via INTRAVENOUS

## 2021-12-22 MED ORDER — BUPIVACAINE HCL (PF) 0.5 % IJ SOLN
INTRAMUSCULAR | Status: AC
  Filled 2021-12-22: qty 30

## 2021-12-22 MED ORDER — ACETAMINOPHEN 325 MG PO TABS
650.0000 mg | ORAL_TABLET | Freq: Four times a day (QID) | ORAL | Status: AC | PRN
  Administered 2021-12-22: 650 mg via ORAL

## 2021-12-22 MED ORDER — FENTANYL CITRATE PF 50 MCG/ML IJ SOSY
25.0000 ug | PREFILLED_SYRINGE | INTRAMUSCULAR | Status: DC | PRN
  Administered 2021-12-22 (×3): 50 ug via INTRAVENOUS
  Filled 2021-12-22: qty 1

## 2021-12-22 MED ORDER — SCOPOLAMINE 1 MG/3DAYS TD PT72
MEDICATED_PATCH | TRANSDERMAL | Status: DC | PRN
  Administered 2021-12-22: 1 via TRANSDERMAL

## 2021-12-22 MED ORDER — LACTATED RINGERS IV SOLN: INTRAVENOUS | Status: DC | PRN

## 2021-12-22 MED ORDER — BUPIVACAINE HCL (PF) 0.5 % IJ SOLN
INTRAMUSCULAR | Status: DC | PRN
  Administered 2021-12-22: 18 mL

## 2021-12-22 MED ORDER — SODIUM CHLORIDE 0.9 % IV SOLN
2.0000 g | INTRAVENOUS | Status: AC
  Administered 2021-12-22: 2 g via INTRAVENOUS
  Filled 2021-12-22: qty 2

## 2021-12-22 MED ORDER — FENTANYL CITRATE PF 50 MCG/ML IJ SOSY
PREFILLED_SYRINGE | INTRAMUSCULAR | Status: AC
  Filled 2021-12-22: qty 1

## 2021-12-22 MED ORDER — MIDAZOLAM HCL 2 MG/2ML IJ SOLN
INTRAMUSCULAR | Status: DC | PRN
  Administered 2021-12-22: 2 mg via INTRAVENOUS

## 2021-12-22 MED ORDER — LIDOCAINE HCL (PF) 2 % IJ SOLN
INTRAMUSCULAR | Status: AC
  Filled 2021-12-22: qty 5

## 2021-12-22 MED ORDER — SEVOFLURANE IN SOLN
RESPIRATORY_TRACT | Status: AC
  Filled 2021-12-22: qty 250

## 2021-12-22 MED ORDER — CHLORHEXIDINE GLUCONATE CLOTH 2 % EX PADS
6.0000 | MEDICATED_PAD | Freq: Once | CUTANEOUS | Status: DC
Start: 2021-12-22 — End: 2021-12-22

## 2021-12-22 MED ORDER — CHLORHEXIDINE GLUCONATE CLOTH 2 % EX PADS: 6.0000 | MEDICATED_PAD | Freq: Once | CUTANEOUS | Status: DC

## 2021-12-22 MED ORDER — PROPOFOL 10 MG/ML IV BOLUS
INTRAVENOUS | Status: DC | PRN
Start: 2021-12-22 — End: 2021-12-22
  Administered 2021-12-22: 200 mg via INTRAVENOUS

## 2021-12-22 MED ORDER — ONDANSETRON HCL 4 MG/2ML IJ SOLN
INTRAMUSCULAR | Status: DC | PRN
Start: 2021-12-22 — End: 2021-12-22
  Administered 2021-12-22: 4 mg via INTRAVENOUS

## 2021-12-22 MED ORDER — TRAMADOL HCL 50 MG PO TABS: 50.0000 mg | ORAL_TABLET | Freq: Four times a day (QID) | ORAL | 0 refills | Status: DC | PRN

## 2021-12-22 MED ORDER — 0.9 % SODIUM CHLORIDE (POUR BTL) OPTIME
TOPICAL | Status: DC | PRN
  Administered 2021-12-22: 1000 mL

## 2021-12-22 MED ORDER — KETOROLAC TROMETHAMINE 30 MG/ML IJ SOLN
INTRAMUSCULAR | Status: DC | PRN
  Administered 2021-12-22: 30 mg via INTRAVENOUS

## 2021-12-22 MED ORDER — ACETAMINOPHEN 325 MG PO TABS
ORAL_TABLET | ORAL | Status: AC
  Filled 2021-12-22: qty 2

## 2021-12-22 MED ORDER — ONDANSETRON HCL 4 MG PO TABS: 4.0000 mg | ORAL_TABLET | Freq: Three times a day (TID) | ORAL | 1 refills | Status: AC | PRN

## 2021-12-22 MED ORDER — ONDANSETRON HCL 4 MG/2ML IJ SOLN
INTRAMUSCULAR | Status: AC
  Filled 2021-12-22: qty 2

## 2021-12-22 MED ORDER — PROPOFOL 10 MG/ML IV BOLUS
INTRAVENOUS | Status: AC
  Filled 2021-12-22: qty 20

## 2021-12-22 MED ORDER — DEXAMETHASONE SODIUM PHOSPHATE 4 MG/ML IJ SOLN
INTRAMUSCULAR | Status: DC | PRN
  Administered 2021-12-22: 5 mg via INTRAVENOUS

## 2021-12-22 MED ORDER — CHLORHEXIDINE GLUCONATE 0.12 % MT SOLN
OROMUCOSAL | Status: AC
  Filled 2021-12-22: qty 15

## 2021-12-22 MED ORDER — HYDROMORPHONE HCL 1 MG/ML IJ SOLN
0.5000 mg | INTRAMUSCULAR | Status: DC | PRN
  Administered 2021-12-22: 0.5 mg via INTRAVENOUS

## 2021-12-22 MED ORDER — FENTANYL CITRATE (PF) 250 MCG/5ML IJ SOLN
INTRAMUSCULAR | Status: AC
  Filled 2021-12-22: qty 5

## 2021-12-22 MED ORDER — HYDROMORPHONE HCL 1 MG/ML IJ SOLN
INTRAMUSCULAR | Status: AC
  Filled 2021-12-22: qty 0.5

## 2021-12-22 MED ORDER — SCOPOLAMINE 1 MG/3DAYS TD PT72
MEDICATED_PATCH | TRANSDERMAL | Status: AC
  Filled 2021-12-22: qty 1

## 2021-12-22 MED ORDER — ROCURONIUM BROMIDE 10 MG/ML (PF) SYRINGE
PREFILLED_SYRINGE | INTRAVENOUS | Status: DC | PRN
  Administered 2021-12-22: 60 mg via INTRAVENOUS

## 2021-12-22 MED ORDER — SUGAMMADEX SODIUM 200 MG/2ML IV SOLN
INTRAVENOUS | Status: DC | PRN
  Administered 2021-12-22: 200 mg via INTRAVENOUS

## 2021-12-22 MED ORDER — KETOROLAC TROMETHAMINE 30 MG/ML IJ SOLN
INTRAMUSCULAR | Status: AC
  Filled 2021-12-22: qty 1

## 2021-12-22 MED ORDER — FENTANYL CITRATE (PF) 250 MCG/5ML IJ SOLN
INTRAMUSCULAR | Status: DC | PRN
  Administered 2021-12-22 (×5): 50 ug via INTRAVENOUS

## 2021-12-22 MED ORDER — ROCURONIUM BROMIDE 10 MG/ML (PF) SYRINGE
PREFILLED_SYRINGE | INTRAVENOUS | Status: AC
  Filled 2021-12-22: qty 10

## 2021-12-22 SURGICAL SUPPLY — 40 items
ADH SKN CLS APL DERMABOND .7 (GAUZE/BANDAGES/DRESSINGS) ×1
APL PRP STRL LF DISP 70% ISPRP (MISCELLANEOUS) ×1
BLADE SURG 15 STRL LF DISP TIS (BLADE) ×1 IMPLANT
BLADE SURG 15 STRL SS (BLADE) ×2
CHLORAPREP W/TINT 26 (MISCELLANEOUS) ×2 IMPLANT
CLOTH BEACON ORANGE TIMEOUT ST (SAFETY) ×2 IMPLANT
COVER LIGHT HANDLE STERIS (MISCELLANEOUS) ×4 IMPLANT
CUTTER FLEX LINEAR 45M (STAPLE) ×1 IMPLANT
DECANTER SPIKE VIAL GLASS SM (MISCELLANEOUS) ×2 IMPLANT
DERMABOND ADVANCED (GAUZE/BANDAGES/DRESSINGS) ×1
DERMABOND ADVANCED .7 DNX12 (GAUZE/BANDAGES/DRESSINGS) IMPLANT
ELECT REM PT RETURN 9FT ADLT (ELECTROSURGICAL) ×2
ELECTRODE REM PT RTRN 9FT ADLT (ELECTROSURGICAL) ×1 IMPLANT
GLOVE SURG ENC MOIS LTX SZ6.5 (GLOVE) ×2 IMPLANT
GLOVE SURG UNDER POLY LF SZ7 (GLOVE) ×8 IMPLANT
GOWN STRL REUS W/TWL LRG LVL3 (GOWN DISPOSABLE) ×4 IMPLANT
INST SET LAPROSCOPIC AP (KITS) ×2 IMPLANT
KIT TURNOVER KIT A (KITS) ×2 IMPLANT
MANIFOLD NEPTUNE II (INSTRUMENTS) ×2 IMPLANT
NDL INSUFFLATION 14GA 120MM (NEEDLE) ×1 IMPLANT
NEEDLE INSUFFLATION 14GA 120MM (NEEDLE) ×2 IMPLANT
NS IRRIG 1000ML POUR BTL (IV SOLUTION) ×2 IMPLANT
PACK LAP CHOLE LZT030E (CUSTOM PROCEDURE TRAY) ×2 IMPLANT
PAD ARMBOARD 7.5X6 YLW CONV (MISCELLANEOUS) ×2 IMPLANT
RELOAD 45 VASCULAR/THIN (ENDOMECHANICALS) ×2 IMPLANT
RELOAD STAPLE 45 2.5 WHT GRN (ENDOMECHANICALS) IMPLANT
SET BASIN LINEN APH (SET/KITS/TRAYS/PACK) ×2 IMPLANT
SET TUBE IRRIG SUCTION NO TIP (IRRIGATION / IRRIGATOR) IMPLANT
SHEARS HARMONIC ACE PLUS 36CM (ENDOMECHANICALS) ×1 IMPLANT
SLEEVE ENDOPATH XCEL 5M (ENDOMECHANICALS) ×2 IMPLANT
SUT MNCRL AB 4-0 PS2 18 (SUTURE) IMPLANT
SUT VICRYL 0 UR6 27IN ABS (SUTURE) IMPLANT
SYR 30ML LL (SYRINGE) ×1 IMPLANT
TRAY FOLEY W/BAG SLVR 16FR (SET/KITS/TRAYS/PACK) ×2
TRAY FOLEY W/BAG SLVR 16FR ST (SET/KITS/TRAYS/PACK) ×1 IMPLANT
TROCAR ENDO BLADELESS 11MM (ENDOMECHANICALS) ×2 IMPLANT
TROCAR XCEL NON-BLD 5MMX100MML (ENDOMECHANICALS) ×2 IMPLANT
TROCAR XCEL UNIV SLVE 11M 100M (ENDOMECHANICALS) IMPLANT
TUBING EVAC SMOKE HEATED PNEUM (TUBING) ×2 IMPLANT
WARMER LAPAROSCOPE (MISCELLANEOUS) ×2 IMPLANT

## 2021-12-22 NOTE — Discharge Instructions (Signed)
No findings of concern in pelvis or RLQ or throughout the abdomen. Two copies of photos sent with your father.  Discharge Laparoscopic Surgery Instructions:  Common Complaints: Right shoulder pain is common after laparoscopic surgery. This is secondary to the gas used in the surgery being trapped under the diaphragm.  Walk to help your body absorb the gas. This will improve in a few days. Pain at the port sites are common, especially the larger port sites. This will improve with time.  Some nausea is common and poor appetite. The main goal is to stay hydrated the first few days after surgery.   Diet/ Activity: Diet as tolerated. You may not have an appetite, but it is important to stay hydrated. Drink 64 ounces of water a day. Your appetite will return with time.  Shower per your regular routine daily.  Do not take hot showers. Take warm showers that are less than 10 minutes. Rest and listen to your body, but do not remain in bed all day.  Walk everyday for at least 15-20 minutes. Deep cough and move around every 1-2 hours in the first few days after surgery.  Do not lift > 10 lbs, perform excessive bending, pushing, pulling, squatting for 1-2 weeks after surgery.  Do not pick at the dermabond glue on your incision sites.  This glue film will remain in place for 1-2 weeks and will start to peel off.  Do not place lotions or balms on your incision unless instructed to specifically by Dr. Henreitta Leber.   Pain Expectations and Narcotics: -After surgery you will have pain associated with your incisions and this is normal. The pain is muscular and nerve pain, and will get better with time. -You are encouraged and expected to take non narcotic medications like tylenol and ibuprofen (when able) to treat pain as multiple modalities can aid with pain treatment. -Narcotics are only used when pain is severe or there is breakthrough pain. -You are not expected to have a pain score of 0 after surgery, as we  cannot prevent pain. A pain score of 3-4 that allows you to be functional, move, walk, and tolerate some activity is the goal. The pain will continue to improve over the days after surgery and is dependent on your surgery. -Due to  law, we are only able to give a certain amount of pain medication to treat post operative pain, and we only give additional narcotics on a patient by patient basis.  -For most laparoscopic surgery, studies have shown that the majority of patients only need 10-15 narcotic pills, and for open surgeries most patients only need 15-20.   -Having appropriate expectations of pain and knowledge of pain management with non narcotics is important as we do not want anyone to become addicted to narcotic pain medication.  -Using ice packs in the first 48 hours and heating pads after 48 hours, wearing an abdominal binder (when recommended), and using over the counter medications are all ways to help with pain management.   -Simple acts like meditation and mindfulness practices after surgery can also help with pain control and research has proven the benefit of these practices.  Medication: Take tylenol and ibuprofen as needed for pain control, alternating every 4-6 hours.  Example:  Tylenol 1000mg  @ 6am, 12noon, 6pm, (Do not exceed 4000mg  of tylenol a day). Ibuprofen 800mg  @ 9am, 3pm, 9pm, 3am (Do not exceed 3600mg  of ibuprofen a day).  Take Tramadol for breakthrough pain every 6 hours.  Take  Colace for constipation related to narcotic pain medication. If you do not have a bowel movement in 2 days, take Miralax over the counter.  Drink plenty of water to also prevent constipation.   Contact Information: If you have questions or concerns, please call our office, [phone], Monday- Thursday 8AM-5PM and Friday 8AM-12Noon.  If it is after hours or on the weekend, please call [facility] Main [facility], [phone], [phone], and ask to speak to the surgeon on call for Dr.  Henreitta Leber at [facility].

## 2021-12-22 NOTE — Progress Notes (Signed)
[facility]  Updated her father. Two copies of the intraoperative photos given to the patient, one with labels, one for the Gyn.   Will do post op phone call 01/06/22.  Algis Greenhouse, MD [facility] [address] [place] [place], [place] [address] [phone] (office)

## 2021-12-22 NOTE — Op Note (Signed)
[facility]  Date of Surgery: 12/22/2021  Admit Date: 12/22/2021   Performing Service: General  Surgeon(s) and Role:    * [name], MD - Primary   Pre-operative Diagnosis: Appendicolith, Right lower quadrant pain  Post-operative Diagnosis: Same  Procedure Performed: Diagnostic laparoscopy, appendectomy   Surgeon: Leatrice Jewels. Henreitta Leber, MD   Assistant: Dr. Despina Hidden came in for intraoperative consult to inspect the pelvic organs  Anesthesia: General   Findings:  The diagnostic laparoscopy was normal. No endometriosis or implants noted. Normal small intestine and colon. Normal appearing appendix.   Estimated Blood Loss: Minimal   Specimens:  ID Type Source Tests Collected by Time Destination  1 : Appendix Tissue PATH Appendix SURGICAL PATHOLOGY [name], MD 12/22/2021 1044      Complications: None; patient tolerated the procedure well.   Disposition: PACU - hemodynamically stable.   Condition: stable   Findings: Normal findings          Indications: The patient presented a long history of RLQ pain and concern for appendicolith on CT scan. She had pain that was debilitating and prior history of endometriosis that was controlled with hormonal  therapy. She had seen her GYN and had US of the pelvis that was normal. We discussed a diagnostic laparoscopy and appendectomy. Dr. Despina Hidden planned to be available to assess her pelvic organs given that her GYN was in [place] only.   Procedure Details  Prior to the procedure, the risks, benefits, complications, treatment options, and expected outcomes were discussed with the patient and/or family, including but not limited to the risk of bleeding, infection, finding of a normal appendix, and the need for conversion to an open procedure, finding endometriosis and possibly finding nothing concerning. There was concurrence with the proposed plan and informed consent was obtained. The patient was taken to the  operating room, identified as [employer] and the procedure verified as Diagnostic Laparoscopy and Appendectomy.    The patient was placed in the supine position and general anesthesia was induced, along with placement of orogastric tube, SCD's, and a Foley catheter. The abdomen was prepped and draped in a sterile fashion. The abdomen was entered with Veress technique in the supraumbilical incision. Intraperitoneal placement was confirmed with saline drop, low entry pressures, and easy insufflation. A 11 mm optiview trocar was placed under direct visualization with a 0 degree scope. The 10 mm 0 degree scope was placed in the abdomen and no evidence of injury was identified. A 12 mm port was placed in the left lower quadrant of the abdomen after skin incision with trocar placement under direct vision.  An additional 5 mm port was placed in the suprapubic area under direct vision.  A careful evaluation of the entire abdomen was carried out. Everything was noted to be normal in the right upper and lower quadrants, the left upper and lower quadrants, and in her pelvis. Multiple photos were taken and a copy was given to the patient. The patient was placed in Trendelenburg and left lateral decubitus position.  Dr. Despina Hidden came in while I was inspecting the pelvic organs and the uterus, right and left ovary and tube were healthy and there was no sign of endometriosis or implants elsewhere.  The small intestines were retracted in the cephalad and left lateral direction away from the pelvis and right lower quadrant. The patient was found to have long, narrow, non inflamed appendix.   The appendix was carefully dissected. A window was made in the  mesoappendix at the base of the appendix. The appendix was divided at its base using a vascular load endo-GIA stapler. No appendiceal stump was left in place. The mesoappendix was taken with the harmonic energy device. The appendix was placed within an Endocatch specimen bag.  There was no evidence of bleeding, leakage, or complication after division of the appendix. The small bowel was then ran carefully from the terminal ileum to the ligament of treitz and no pathology was noted.   Hemostasis was confirmed. The endocatch bag was removed via the 12 mm port, then the abdomen desufflated. The appendix was passed off the field as a specimen.   The the 12 mm and 10 mm port sites were closed with a 0 Vicryl suture. The trocar site skin wounds were closed using subcuticular 4-0 Monocryl suture and dermabond. The patient was then awakened from general anesthesia, extubated, and taken to PACU for recovery.   Instrument, sponge, and needle counts were correct at the conclusion of the case.   Algis Greenhouse, MD [facility] [address], [place] [address] [phone]([facility])

## 2021-12-22 NOTE — Transfer of Care (Signed)
Immediate Anesthesia Transfer of Care Note  Patient: Stacey Compton  Procedure(s) Performed: LAPAROSCOPY DIAGNOSTIC, APPENDECTOMY (Abdomen)  Patient Location: PACU  Anesthesia Type:General  Level of Consciousness: awake, alert  and oriented  Airway & Oxygen Therapy: Patient Spontanous Breathing and Patient connected to nasal cannula oxygen  Post-op Assessment: Report given to RN and Post -op Vital signs reviewed and stable  Post vital signs: Reviewed and stable  Last Vitals:  Vitals Value Taken Time  BP 124/76 12/22/21 1112  Temp    Pulse    Resp 17 12/22/21 1113  SpO2    Vitals shown include unvalidated device data.  Last Pain:  Vitals:   12/22/21 0817  PainSc: 4          Complications: No notable events documented.

## 2021-12-22 NOTE — Anesthesia Postprocedure Evaluation (Signed)
Anesthesia Post Note  Patient: Archivist  Procedure(s) Performed: LAPAROSCOPY DIAGNOSTIC, APPENDECTOMY (Abdomen)  Patient location during evaluation: Phase II Anesthesia Type: General Level of consciousness: awake Pain management: pain level controlled Vital Signs Assessment: post-procedure vital signs reviewed and stable Respiratory status: spontaneous breathing and respiratory function stable Cardiovascular status: blood pressure returned to baseline and stable Postop Assessment: no headache and no apparent nausea or vomiting Anesthetic complications: no Comments: Late entry   No notable events documented.   Last Vitals:  Vitals:   12/22/21 1215 12/22/21 1305  BP: 128/74 130/69  Pulse: 85 98  Resp: 12 16  Temp:  36.9 C  SpO2: 100% 99%    Last Pain:  Vitals:   12/22/21 1305  TempSrc: Oral  PainSc: 7                  Windell Norfolk

## 2021-12-22 NOTE — Anesthesia Procedure Notes (Signed)
Procedure Name: Intubation Date/Time: 12/22/2021 10:18 AM Performed by: Orlie Dakin, CRNA Pre-anesthesia Checklist: Patient identified, Emergency Drugs available, Suction available and Patient being monitored Patient Re-evaluated:Patient Re-evaluated prior to induction Oxygen Delivery Method: Circle system utilized Preoxygenation: Pre-oxygenation with 100% oxygen Induction Type: IV induction Ventilation: Mask ventilation without difficulty Laryngoscope Size: Miller and 3 Grade View: Grade II Tube type: Oral Tube size: 7.0 mm Number of attempts: 1 Airway Equipment and Method: Stylet Placement Confirmation: ETT inserted through vocal cords under direct vision, positive ETCO2 and breath sounds checked- equal and bilateral Secured at: 22 cm Tube secured with: Tape Dental Injury: Teeth and Oropharynx as per pre-operative assessment  Comments: 4x4s bite block used.

## 2021-12-22 NOTE — Anesthesia Preprocedure Evaluation (Addendum)
Anesthesia Evaluation  Patient identified by MRN, date of birth, ID band Patient awake    Reviewed: Allergy & Precautions, H&P , NPO status , Patient's Chart, lab work & pertinent test results, reviewed documented beta blocker date and time   History of Anesthesia Complications (+) PONV and history of anesthetic complications  Airway Mallampati: II  TM Distance: >3 FB Neck ROM: full    Dental no notable dental hx. (+) Dental Advisory Given, Teeth Intact   Pulmonary neg pulmonary ROS,    Pulmonary exam normal breath sounds clear to auscultation       Cardiovascular Exercise Tolerance: Good negative cardio ROS   Rhythm:regular Rate:Normal     Neuro/Psych  Headaches, PSYCHIATRIC DISORDERS Anxiety Depression    GI/Hepatic Neg liver ROS, GERD  Medicated,  Endo/Other  negative endocrine ROS  Renal/GU negative Renal ROS  negative genitourinary   Musculoskeletal   Abdominal   Peds  Hematology  (+) Blood dyscrasia, anemia ,   Anesthesia Other Findings Veneers on teeth upper front, none loose.  Reproductive/Obstetrics negative OB ROS                            Anesthesia Physical Anesthesia Plan  ASA: 2  Anesthesia Plan: General and General ETT   Post-op Pain Management:    Induction:   PONV Risk Score and Plan: Ondansetron and Scopolamine patch - Pre-op  Airway Management Planned:   Additional Equipment:   Intra-op Plan:   Post-operative Plan:   Informed Consent: I have reviewed the patients History and Physical, chart, labs and discussed the procedure including the risks, benefits and alternatives for the proposed anesthesia with the patient or authorized representative who has indicated his/her understanding and acceptance.     Dental Advisory Given  Plan Discussed with: CRNA  Anesthesia Plan Comments:         Anesthesia Quick Evaluation

## 2021-12-22 NOTE — Interval H&P Note (Signed)
History and Physical Interval Note:  12/22/2021 8:36 AM  Stacey Compton  has presented today for surgery, with the diagnosis of appendicolith K38.9, RLQ pain R10.30.  The various methods of treatment have been discussed with the patient and family. After consideration of risks, benefits and other options for treatment, the patient has consented to  Procedure(s): LAPAROSCOPY DIAGNOSTIC, appendectomy (N/A) as a surgical intervention.  The patient's history has been reviewed, patient examined, no change in status, stable for surgery.  I have reviewed the patient's chart and labs.  Questions were answered to the patient's satisfaction.    Continued pain, discussed risk of bleeding, infection, injury to other organs, normal appendix, not helping the pain, Dr. Despina Hidden likely coming in to look at the area to ensure no endometriosis lesions.  [name]

## 2021-12-24 ENCOUNTER — Encounter (HOSPITAL_COMMUNITY): Payer: Self-pay | Admitting: General Surgery

## 2021-12-24 LAB — SURGICAL PATHOLOGY

## 2021-12-24 NOTE — Progress Notes (Signed)
Notified patient of pathology, no inflammation and fecal contents, consistent with fecalith. She says her pain is better and no further right sided pain. Just having soreness at surgery site.

## 2021-12-29 ENCOUNTER — Ambulatory Visit: Payer: 59 | Admitting: Internal Medicine

## 2022-01-06 ENCOUNTER — Ambulatory Visit (INDEPENDENT_AMBULATORY_CARE_PROVIDER_SITE_OTHER): Payer: 59 | Admitting: General Surgery

## 2022-01-06 DIAGNOSIS — R1031 Right lower quadrant pain: Secondary | ICD-10-CM

## 2022-01-06 DIAGNOSIS — K389 Disease of appendix, unspecified: Secondary | ICD-10-CM

## 2022-01-07 ENCOUNTER — Ambulatory Visit (INDEPENDENT_AMBULATORY_CARE_PROVIDER_SITE_OTHER): Payer: 59 | Admitting: General Surgery

## 2022-01-07 DIAGNOSIS — K389 Disease of appendix, unspecified: Secondary | ICD-10-CM

## 2022-01-07 DIAGNOSIS — R1031 Right lower quadrant pain: Secondary | ICD-10-CM

## 2022-01-07 NOTE — Progress Notes (Signed)
[facility] ? ?I am calling the patient for post operative evaluation. This is not a billable encounter as it is under the global charges for the surgery.  The patient had a laparoscopic appendectomy on 12/22/21. The patient reports that she is doing well and her pain in the RLQ has resolved. The are tolerating a diet, having good pain control, and having regular Bms.  The incisions are healing without issues. The patient has no concerns.  ? ? ?FINAL MICROSCOPIC DIAGNOSIS:  ? ?A. APPENDIX, APPENDECTOMY:  ?No significant acute inflammation is seen in the entirely submitted  ?appendix.  ?Clinical correlation is required.  ? ?GROSS DESCRIPTION:  ? ?Specimen: Received in formalin is an intact appendix with a staple line  ?at the margin that is removed and under inked black.  ?Size: 4.0 cm in length by 0.5 to 0.8 cm in diameter (maximum diameter is  ?at margin)  ?Serosa: Pink-tan, smooth, and intact with adipose extending up to 2.3 cm  ?from the serosal surface.  ?Mucosa: Tan without distinct lesions, disruptions, or necrosis  ?Wall: Intact with a thickness of 0.2 cm  ?Lumen: Pinpoint to a maximum diameter of 0.5 cm (at margin) with a  ?minimal amount of compacted fecal material measuring 0.9 x 0.5 x 0.3 cm  ?at the maximum area of dilation.  ? ?Will see the patient PRN.  ?Diet and activity as tolerated.  ? ?Algis Greenhouse, MD ?[facility] ?[address] ?[place], [place] [address] ?[phone] (office)  ?

## 2022-01-07 NOTE — Progress Notes (Signed)
Duplicate. Called on 01/07/2022. Filled out documentation on the 01/06/22 schedule. ?NO CHARGE.  ? ?Algis Greenhouse, MD ?[facility] ?[address] ?[place], [place] [address] ?[phone] (office) ? ?

## 2022-02-14 ENCOUNTER — Encounter: Payer: Self-pay | Admitting: Internal Medicine

## 2022-03-30 ENCOUNTER — Other Ambulatory Visit: Payer: Self-pay | Admitting: Cardiology

## 2022-04-05 ENCOUNTER — Telehealth: Payer: Self-pay | Admitting: *Deleted

## 2022-04-05 NOTE — Telephone Encounter (Signed)
Pt last seen 11/24/2021.  Received refill request from [facility] for Pantoprazole 40 mg tablets once daily.

## 2022-04-06 ENCOUNTER — Other Ambulatory Visit: Payer: Self-pay | Admitting: Internal Medicine

## 2022-04-06 MED ORDER — PANTOPRAZOLE SODIUM 40 MG PO TBEC: 40.0000 mg | DELAYED_RELEASE_TABLET | Freq: Every day | ORAL | 11 refills | Status: DC

## 2022-04-06 NOTE — Telephone Encounter (Signed)
Prescription sent to [facility] in [place].

## 2022-04-19 NOTE — Telephone Encounter (Signed)
Attempted to do PA for nurtec on CMM. This medication or product was previously approved on [id number] from 2021-11-23 to 2022-11-23.

## 2022-04-22 ENCOUNTER — Other Ambulatory Visit: Payer: Self-pay | Admitting: Cardiology

## 2022-05-02 ENCOUNTER — Telehealth: Payer: Self-pay

## 2022-05-02 NOTE — Telephone Encounter (Signed)
Last ov 11/24/21. Refill request received for pantoprazole 40 mg tablets, qty: 30 to be sent to [facility].

## 2022-05-03 ENCOUNTER — Telehealth (INDEPENDENT_AMBULATORY_CARE_PROVIDER_SITE_OTHER): Payer: 59 | Admitting: Adult Health

## 2022-05-03 ENCOUNTER — Other Ambulatory Visit: Payer: Self-pay | Admitting: Internal Medicine

## 2022-05-03 DIAGNOSIS — G43109 Migraine with aura, not intractable, without status migrainosus: Secondary | ICD-10-CM

## 2022-05-03 MED ORDER — PANTOPRAZOLE SODIUM 40 MG PO TBEC: 40.0000 mg | DELAYED_RELEASE_TABLET | Freq: Every day | ORAL | 11 refills | Status: AC

## 2022-05-03 MED ORDER — PANTOPRAZOLE SODIUM 40 MG PO TBEC: 40.0000 mg | DELAYED_RELEASE_TABLET | Freq: Every day | ORAL | 11 refills | Status: DC

## 2022-05-03 MED ORDER — NURTEC 75 MG PO TBDP: ORAL_TABLET | ORAL | 11 refills | Status: DC

## 2022-05-03 NOTE — Telephone Encounter (Signed)
Done

## 2022-06-16 ENCOUNTER — Encounter: Payer: Self-pay | Admitting: Adult Health

## 2022-06-16 MED ORDER — PREDNISONE 5 MG PO TABS: ORAL_TABLET | ORAL | 0 refills | Status: DC

## 2022-06-16 NOTE — Telephone Encounter (Signed)
I spoke with Aundra Millet NP and received verbal order to refill pt's Prednisone taper that she has previously taken.   Spoke with patient. She confirmed this is a bad but typical migraine which has been unresponsive to nurtec and her muscle relaxer. She states she has "full pain" and vomiting today. Prednisone taper has helped in the past. Pt amenable to try this. She will call if she does not obtain relief. Pt was advised that if she develops any new or concerning symptoms to go to the ER. She verbalized appreciation for the call.

## 2022-06-29 ENCOUNTER — Telehealth: Payer: Self-pay

## 2022-06-29 NOTE — Telephone Encounter (Signed)
Patient called and left a vm requesting that someone call her to schedule an appointment.

## 2022-07-18 NOTE — Telephone Encounter (Signed)
Tried calling patient, no answer, left message for her to call me back to schedule OV.

## 2022-10-04 ENCOUNTER — Telehealth: Payer: Self-pay | Admitting: Gastroenterology

## 2022-10-04 NOTE — Telephone Encounter (Signed)
Good afternoon Dr. Orvan Falconer,   Supervising MD for today PM   Patient called stating that she has a referral to our office from her PCP [facility] for IBS.  Patient stated that she does have history with [facility] with Dr. Marletta Lor and is wanting to transfer her care to our office. Patient was last seen in January of 2023. Patients are in epic, as well as her colonoscopy and pathology reports from 2021. Will you please review and advise on scheduling patient?   Thank you.

## 2022-10-14 NOTE — Telephone Encounter (Signed)
Lvm for patient to call back to advise [place] if she had the surgical consultation that Dr. Marletta Lor wanted her to have about the removal of her appendix.

## 2022-10-14 NOTE — Telephone Encounter (Signed)
Dr. Orvan Falconer, patient stated that she did have her appendix removed by Dr. Henreitta Leber on 12/22/2021. Her pathology report from the procedure is in epic. Please review.   Thank you.

## 2022-10-17 ENCOUNTER — Encounter: Payer: Self-pay | Admitting: Gastroenterology

## 2022-10-17 NOTE — Telephone Encounter (Signed)
Patient has been scheduled for OV with Dr. Orvan Falconer on 1/22 at 9:50

## 2022-11-04 ENCOUNTER — Encounter (HOSPITAL_COMMUNITY): Payer: Self-pay

## 2022-11-04 ENCOUNTER — Emergency Department (HOSPITAL_COMMUNITY)
Admission: EM | Admit: 2022-11-04 | Discharge: 2022-11-04 | Disposition: A | Discharge status: Home Health | Payer: 59 | Attending: Emergency Medicine | Admitting: Emergency Medicine

## 2022-11-04 ENCOUNTER — Emergency Department (HOSPITAL_COMMUNITY): Payer: 59

## 2022-11-04 DIAGNOSIS — K625 Hemorrhage of anus and rectum: Secondary | ICD-10-CM | POA: Insufficient documentation

## 2022-11-04 LAB — CBC
HCT: 40 % (ref 36.0–46.0)
Hemoglobin: 13.5 g/dL (ref 12.0–15.0)
MCH: 32.4 pg (ref 26.0–34.0)
MCHC: 33.8 g/dL (ref 30.0–36.0)
MCV: 95.9 fL (ref 80.0–100.0)
Platelets: 264 10*3/uL (ref 150–400)
RBC: 4.17 MIL/uL (ref 3.87–5.11)
RDW: 12.2 % (ref 11.5–15.5)
WBC: 8.2 10*3/uL (ref 4.0–10.5)
nRBC: 0 % (ref 0.0–0.2)

## 2022-11-04 LAB — COMPREHENSIVE METABOLIC PANEL
ALT: 27 U/L (ref 0–44)
AST: 26 U/L (ref 15–41)
Albumin: 4.1 g/dL (ref 3.5–5.0)
Alkaline Phosphatase: 41 U/L (ref 38–126)
Anion gap: 5 (ref 5–15)
BUN: 5 mg/dL — ABNORMAL LOW (ref 6–20)
CO2: 27 mmol/L (ref 22–32)
Calcium: 9.1 mg/dL (ref 8.9–10.3)
Chloride: 106 mmol/L (ref 98–111)
Creatinine, Ser: 0.62 mg/dL (ref 0.44–1.00)
GFR, Estimated: >60 mL/min (ref >60)
Glucose, Bld: 112 mg/dL — ABNORMAL HIGH (ref 70–99)
Potassium: 4 mmol/L (ref 3.5–5.1)
Sodium: 138 mmol/L (ref 135–145)
Total Bilirubin: 0.6 mg/dL (ref 0.3–1.2)
Total Protein: 7.3 g/dL (ref 6.5–8.1)

## 2022-11-04 LAB — URINALYSIS, ROUTINE W REFLEX MICROSCOPIC
Bilirubin Urine: NEGATIVE
Glucose, UA: NEGATIVE mg/dL
Hgb urine dipstick: NEGATIVE
Ketones, ur: NEGATIVE mg/dL
Leukocytes,Ua: NEGATIVE
Nitrite: NEGATIVE
Protein, ur: NEGATIVE mg/dL
Specific Gravity, Urine: 1.001 — ABNORMAL LOW (ref 1.005–1.030)
pH: 7 (ref 5.0–8.0)

## 2022-11-04 LAB — POC URINE PREG, ED: Preg Test, Ur: NEGATIVE

## 2022-11-04 LAB — LIPASE, BLOOD: Lipase: 38 U/L (ref 11–51)

## 2022-11-04 MED ORDER — TRAMADOL HCL 50 MG PO TABS: ORAL_TABLET | ORAL | 0 refills | Status: DC

## 2022-11-04 MED ORDER — IOHEXOL 300 MG/ML  SOLN
100.0000 mL | Freq: Once | INTRAMUSCULAR | Status: AC | PRN
  Administered 2022-11-04: 100 mL via INTRAVENOUS

## 2022-11-04 NOTE — ED Provider Triage Note (Cosign Needed)
Emergency Medicine Provider Triage Evaluation Note  Stacey Compton , a 36 y.o. female  was evaluated in triage.  Pt complains of abdominal pain and bloody stools.  Symptoms have been ongoing for the last 2 weeks.  Abdominal pain is located in the right and lower quadrants.  Feels like labor pains and is intermittent.  Bloody stools have been going on for the last 4 to 5 days.  But can be bright at times last night she feels the toilet with blood and it was more dark in color.  Patient no longer has an appendix or gallbladder.  Patient did mention history of IBS.  Denies urinary changes.  Endorses mild nausea but no vomiting.  Review of Systems  Positive: See above Negative: See above  Physical Exam  BP (!) 151/123 (BP Location: Right Arm)   Pulse 97   Temp 98.2 F (36.8 C) (Oral)   Resp 16   Ht 5\' 3"  (1.6 m)   Wt 67.1 kg   LMP 10/20/2022 (Approximate)   SpO2 97%   BMI 26.22 kg/m  Gen:   Awake, no distress   Resp:  Normal effort  MSK:   Moves extremities without difficulty  Other:    Medical Decision Making  Medically screening exam initiated at 12:55 PM.  Appropriate orders placed.  Stacey Compton was informed that the remainder of the evaluation will be completed by another provider, this initial triage assessment does not replace that evaluation, and the importance of remaining in the ED until their evaluation is complete.  Work up started   10/22/2022, PA-C 11/04/22 1256

## 2022-11-04 NOTE — ED Triage Notes (Addendum)
Pt reports hx of IBS but recently having a problem with constipation, straining to have a BM and bright red beeding even without BM.  Also reports RLQ pain and her perineum seems swollen and tender.  Sawn PCP this morning and was told to come to ER.

## 2022-11-04 NOTE — ED Provider Notes (Incomplete)
[facility] EMERGENCY DEPARTMENT Provider Note   CSN: [id number] Arrival date & time: 11/04/22  1105     History {Add pertinent medical, surgical, social history, OB history to HPI:1} Chief Complaint  Patient presents with   Abdominal Pain   Rectal Bleeding    Stacey Compton is a 36 y.o. female.  Patient has a history of irritable bowel.  Patient states for 2 weeks she is having abdominal cramping and blood in her stool.   Abdominal Pain Associated symptoms: hematochezia   Rectal Bleeding Associated symptoms: abdominal pain        Home Medications Prior to Admission medications   Medication Sig Start Date End Date Taking? Authorizing Provider  traMADol (ULTRAM) 50 MG tablet Take 1 to 6 hours for pain that is not relieved by Tylenol or Motrin alone 11/04/22  Yes Bethann Berkshire, MD  acidophilus (RISAQUAD) CAPS capsule Take 1 capsule by mouth daily.    [provider]  albuterol (VENTOLIN HFA) 108 (90 Base) MCG/ACT inhaler Inhale 2 puffs into the lungs every 6 (six) hours as needed for wheezing or shortness of breath. 05/23/21   Waldon Merl, PA-C  cholecalciferol (VITAMIN D3) 25 MCG (1000 UNIT) tablet Take 1,000 Units by mouth daily.    [provider]  citalopram (CELEXA) 20 MG tablet TAKE 1 TABLET(20 MG) BY MOUTH AT BEDTIME 09/17/21   Luking, Scott A, MD  clonazePAM (KLONOPIN) 0.5 MG tablet TAKE 1/2 TO 1 (ONE-HALF TO ONE) TABLET BY MOUTH TWICE DAILY AS NEEDED FOR ANXIETY 12/18/20   Luking, Jonna Coup, MD  dicyclomine (BENTYL) 10 MG capsule Take 1 capsule (10 mg total) by mouth 4 (four) times daily -  before meals and at bedtime. Patient taking differently: Take 10 mg by mouth 4 (four) times daily as needed for spasms. 12/10/20 12/20/21  Lanelle Bal, DO  fludrocortisone (FLORINEF) 0.1 MG tablet Take 1 tablet by mouth once daily 03/30/22   Antoine Poche, MD  ondansetron (ZOFRAN) 4 MG tablet Take 1 tablet (4 mg total) by mouth every 8 (eight) hours as  needed. 12/22/21 12/22/22  [name], MD  ORILISSA 150 MG TABS Take 150 mg by mouth at bedtime. 08/24/20   [provider]  pantoprazole (PROTONIX) 40 MG tablet Take 1 tablet (40 mg total) by mouth daily. TAKE 1 TABLET(40 MG) BY MOUTH DAILY Strength: 40 mg 05/03/22 05/03/23  Lanelle Bal, DO  predniSONE (DELTASONE) 5 MG tablet Begin taking 6 tablets daily, taper by one tablet daily until off the medication. Take with food. 06/16/22   Butch Penny, NP  Rimegepant Sulfate (NURTEC) 75 MG TBDP TAKE 1 TABLET BY MOUTH AT ONSET OF MIGRAINE AND MAY REPEAT 1 TABLET IN 24 HOUR 05/03/22   Butch Penny, NP  tiZANidine (ZANAFLEX) 2 MG tablet Take 2 mg by mouth 3 (three) times daily as needed for muscle spasms. 12/15/21   [provider]  vitamin B-12 (CYANOCOBALAMIN) 500 MCG tablet Take 500 mcg by mouth daily.    [provider]      Allergies    Hydrocodone bit-homatrop mbr and Percocet [oxycodone-acetaminophen]    Review of Systems   Review of Systems  Gastrointestinal:  Positive for abdominal pain and hematochezia.    Physical Exam Updated Vital Signs BP (!) 151/123 (BP Location: Right Arm)   Pulse 97   Temp 98.2 F (36.8 C) (Oral)   Resp 16   Ht 5\' 3"  (1.6 m)   Wt 67.1 kg  LMP 10/20/2022 (Approximate)   SpO2 97%   BMI 26.22 kg/m  Physical Exam  ED Results / Procedures / Treatments   Labs (all labs ordered are listed, but only abnormal results are displayed) Labs Reviewed  COMPREHENSIVE METABOLIC PANEL - Abnormal; Notable for the following components:      Result Value   Glucose, Bld 112 (*)    BUN 5 (*)    All other components within normal limits  URINALYSIS, ROUTINE W REFLEX MICROSCOPIC - Abnormal; Notable for the following components:   Color, Urine STRAW (*)    Specific Gravity, Urine 1.001 (*)    All other components within normal limits  LIPASE, BLOOD  CBC  POC URINE PREG, ED    EKG None  Radiology CT Abdomen Pelvis W  Contrast  Result Date: 11/04/2022 CLINICAL DATA:  Abdominal pain, acute nonlocalized. History of IBS. Right lower quadrant pain. EXAM: CT ABDOMEN AND PELVIS WITH CONTRAST TECHNIQUE: Multidetector CT imaging of the abdomen and pelvis was performed using the standard protocol following bolus administration of intravenous contrast. RADIATION DOSE REDUCTION: This exam was performed according to the departmental dose-optimization program which includes automated exposure control, adjustment of the mA and/or kV according to patient size and/or use of iterative reconstruction technique. CONTRAST:  OMNIPAQUE IOHEXOL 300 MG/ML  SOLN COMPARISON:  Report from CT November 16, 2021 however no comparison imaging available at time dictation. CT May 26, 2020. FINDINGS: Lower chest: No acute abnormality. Hepatobiliary: No suspicious hepatic lesion. Gallbladder is unremarkable. No biliary ductal dilation. Pancreas: No pancreatic ductal dilation or evidence of acute inflammation. Spleen: No splenomegaly. Adrenals/Urinary Tract: Bilateral adrenal glands appear normal. No hydronephrosis. Subtle area of hypoenhancement involving the lower pole left kidney on image 57/7. Urinary bladder is unremarkable for degree of distension. Stomach/Bowel: Stomach is unremarkable for degree of distension. No pathologic dilation of small or large bowel. Gas fluid levels in the proximal colon. Small volume of formed stool in the colon. The appendix is not confidently identified however there is no pericecal inflammation. Suture line along the base of the cecum may reflect prior appendectomy. No evidence of acute bowel inflammation. Vascular/Lymphatic: Normal caliber abdominal aorta. No pathologically enlarged abdominal or pelvic lymph nodes. Reproductive: Uterus and bilateral adnexa are unremarkable. Other: No significant abdominopelvic free fluid. Musculoskeletal: No acute osseous abnormality. IMPRESSION: 1. Subtle area of hypoenhancement  involving the lower pole left kidney, which may reflect pyelonephritis. Correlate with urinalysis. 2. Gas fluid levels in the proximal colon, which can be seen in the setting of diarrheal illness. 3. No evidence of acute bowel inflammation. 4. The appendix is not confidently identified however there is no pericecal inflammation. Suture line along the base of the cecum may reflect prior appendectomy. Electronically Signed   By: Maudry Mayhew M.D.   On: 11/04/2022 15:55    Procedures Procedures  {Document cardiac monitor, telemetry assessment procedure when appropriate:1}  Medications Ordered in ED Medications  iohexol (OMNIPAQUE) 300 MG/ML solution 100 mL (100 mLs Intravenous Contrast Given 11/04/22 1526)    ED Course/ Medical Decision Making/ A&P                           Medical Decision Making Amount and/or Complexity of Data Reviewed Labs: ordered.  Risk Prescription drug management.   Patient with worsening irritable bowel.  Labs and CT scan are unremarkable.  She is sent home and will continue her Bentyl and her Protonix and is given Ultram for  pain and follow-up with GI  {Document critical care time when appropriate:1} {Document review of labs and clinical decision tools ie heart score, Chads2Vasc2 etc:1}  {Document your independent review of radiology images, and any outside records:1} {Document your discussion with family members, caretakers, and with consultants:1} {Document social determinants of health affecting pt's care:1} {Document your decision making why or why not admission, treatments were needed:1} Final Clinical Impression(s) / ED Diagnoses Final diagnoses:  Rectal bleeding    Rx / DC Orders ED Discharge Orders          Ordered    traMADol (ULTRAM) 50 MG tablet        11/04/22 1852

## 2022-11-04 NOTE — Discharge Instructions (Signed)
Follow-up with your GI doctor next week.  If you get worse over the weekend return to the emergency department

## 2022-11-11 ENCOUNTER — Other Ambulatory Visit (INDEPENDENT_AMBULATORY_CARE_PROVIDER_SITE_OTHER): Payer: 59

## 2022-11-11 ENCOUNTER — Encounter: Payer: Self-pay | Admitting: Gastroenterology

## 2022-11-11 ENCOUNTER — Ambulatory Visit: Payer: 59 | Admitting: Gastroenterology

## 2022-11-11 VITALS — BP 118/64 | HR 80 | Ht 64.0 in | Wt 146.6 lb

## 2022-11-11 DIAGNOSIS — K648 Other hemorrhoids: Secondary | ICD-10-CM

## 2022-11-11 DIAGNOSIS — K580 Irritable bowel syndrome with diarrhea: Secondary | ICD-10-CM

## 2022-11-11 LAB — SEDIMENTATION RATE: Sed Rate: 14 mm/hr (ref 0–20)

## 2022-11-11 LAB — TSH: TSH: 2.5 u[IU]/mL (ref 0.35–5.50)

## 2022-11-11 LAB — C-REACTIVE PROTEIN: CRP: <1 mg/dL (ref 0.5–20.0)

## 2022-11-11 MED ORDER — HYDROCORTISONE ACETATE 25 MG RE SUPP: 25.0000 mg | Freq: Two times a day (BID) | RECTAL | 2 refills | Status: DC

## 2022-11-11 NOTE — Patient Instructions (Addendum)
Your provider has requested that you go to the basement level for lab work before leaving today. Press "B" on the elevator. The lab is located at the first door on the left as you exit the elevator.  We have sent the following medications to your pharmacy for you to pick up at your convenience: Hydrocortisone suppository twice daily for 7 days.   Start Miralax 1 capful daily in 8 ounces of liquid.  Referral placed for Pelvic Floor PT.  _______________________________________________________  If you are age 20 or older, your body mass index should be between 23-30. Your Body mass index is 25.16 kg/m. If this is out of the aforementioned range listed, please consider follow up with your Primary Care Provider.  If you are age 50 or younger, your body mass index should be between 19-25. Your Body mass index is 25.16 kg/m. If this is out of the aformentioned range listed, please consider follow up with your Primary Care Provider.   ________________________________________________________  The  GI providers would like to encourage you to use [facility] to communicate with providers for non-urgent requests or questions.  Due to long hold times on the telephone, sending your provider a message by [facility] may be a faster and more efficient way to get a response.  Please allow 48 business hours for a response.  Please remember that this is for non-urgent requests.  _______________________________________________________

## 2022-11-11 NOTE — Progress Notes (Signed)
11/18/2022 Stacey Compton [id number] [birth year]   HISTORY OF PRESENT ILLNESS:  This is a 37 year old female with history of IBS, previous endoscopic evaluation unremarkable.  She used to follow-up with Dr. Marletta Lor and had colonoscopy and EGD November 2021.  Colonoscopy showed nonbleeding internal hemorrhoids.  EGD showed gastritis.  All random colonic biopsies and terminal ileal biopsies were negative/normal.  Gastric biopsy showed mild reactive gastropathy and mild chronic gastritis, negative for H. pylori.  Duodenal biopsies showed peptic duodenitis.    She wanted to transfer care to [facility].  She describes bowel movements going back and forth between constipation and diarrhea, more so maybe on the constipated side recently.  Says that she feels like she does not have complete bowel movements and has a hard time pushing stool out.  She has had longstanding right lower quadrant abdominal/pelvic pain.  She does use a powder that contains soluble fiber on a daily basis.  She says that she follows an anti-inflammatory diet very strictly.  Previously had used Bentyl, but says that this stopped her up.  Reports dark-colored stools daily, especially in the evenings.  She just had a CT scan of the abdomen and pelvis with contrast on 10/27/2022 that showed the following:  IMPRESSION: 1. Subtle area of hypoenhancement involving the lower pole left kidney, which may reflect pyelonephritis. Correlate with urinalysis. 2. Gas fluid levels in the proximal colon, which can be seen in the setting of diarrheal illness. 3. No evidence of acute bowel inflammation. 4. The appendix is not confidently identified however there is no pericecal inflammation. Suture line along the base of the cecum may reflect prior appendectomy.  Recent CBC, CMP, lipase also at the end of December unremarkable.  Referred here by Colvin Caroli, FNP for management of her IBS and GI symptoms.  Past Medical History:  Diagnosis  Date   Anemia    Anxiety    B12 deficiency 02/10/2021   B12 low end of normal patient with significant fatigue will go ahead and treat April 2020   Depression    denies   Endometriosis    Hx of varicella    IBS (irritable bowel syndrome)    Infection    UTI   Migraines    with aura   Migraines    PONV (postoperative nausea and vomiting)    Past Surgical History:  Procedure Laterality Date   BIOPSY  09/11/2020   Procedure: BIOPSY;  Surgeon: Lanelle Bal, DO;  Location: [facility];  Service: Endoscopy;;   CHOLECYSTECTOMY N/A 11/18/2019   Procedure: LAPAROSCOPIC CHOLECYSTECTOMY;  Surgeon: [name], MD;  Location: [facility] ORS;  Service: General;  Laterality: N/A;   COLONOSCOPY WITH PROPOFOL N/A 09/11/2020   Procedure: COLONOSCOPY WITH PROPOFOL;  Surgeon: Lanelle Bal, DO;  Location: [facility] END[facility];  Service: Endoscopy;  Laterality: N/A;  10:30am   ESOPHAGOGASTRODUODENOSCOPY (EGD) WITH PROPOFOL N/A 09/11/2020   Procedure: ESOPHAGOGASTRODUODENOSCOPY (EGD) WITH PROPOFOL;  Surgeon: Lanelle Bal, DO;  Location: [facility];  Service: Endoscopy;  Laterality: N/A;   LAPAROSCOPY     with chromopertubation   LAPAROSCOPY N/A 12/22/2021   Procedure: LAPAROSCOPY DIAGNOSTIC, APPENDECTOMY;  Surgeon: [name], MD;  Location: [facility] ORS;  Service: General;  Laterality: N/A;   OTHER SURGICAL HISTORY     dx lap for endometriosis    reports that she has never smoked. She has never used smokeless tobacco. She reports that she does not drink alcohol and does not use drugs.  family history includes Anxiety disorder in her sister; Bipolar disorder in her maternal aunt and maternal grandfather; Cancer in her maternal grandmother; Colon cancer in her cousin; Colon polyps in her father; Crohn's disease in her sister; Depression in her father; Diabetes in her father and mother; Fibromyalgia in her mother; Heart disease in her paternal grandfather; Migraines in her maternal grandfather;  Stroke in her maternal grandfather. Allergies  Allergen Reactions   Hydrocodone Bit-Homatrop Mbr Nausea And Vomiting and Rash    Hycodan    Percocet [Oxycodone-Acetaminophen] Hives and Rash      Outpatient Encounter Medications as of 11/11/2022  Medication Sig   acidophilus (RISAQUAD) CAPS capsule Take 1 capsule by mouth daily.   citalopram (CELEXA) 20 MG tablet TAKE 1 TABLET(20 MG) BY MOUTH AT BEDTIME   clonazePAM (KLONOPIN) 0.5 MG tablet TAKE 1/2 TO 1 (ONE-HALF TO ONE) TABLET BY MOUTH TWICE DAILY AS NEEDED FOR ANXIETY   dicyclomine (BENTYL) 10 MG capsule Take 1 capsule (10 mg total) by mouth 4 (four) times daily -  before meals and at bedtime. (Patient taking differently: Take 10 mg by mouth 4 (four) times daily as needed for spasms.)   fludrocortisone (FLORINEF) 0.1 MG tablet Take 1 tablet by mouth once daily   hydrocortisone (ANUSOL-HC) 25 MG suppository Place 1 suppository (25 mg total) rectally 2 (two) times daily.   ondansetron (ZOFRAN) 4 MG tablet Take 1 tablet (4 mg total) by mouth every 8 (eight) hours as needed.   pantoprazole (PROTONIX) 40 MG tablet Take 1 tablet (40 mg total) by mouth daily. TAKE 1 TABLET(40 MG) BY MOUTH DAILY Strength: 40 mg   Rimegepant Sulfate (NURTEC) 75 MG TBDP TAKE 1 TABLET BY MOUTH AT ONSET OF MIGRAINE AND MAY REPEAT 1 TABLET IN 24 HOUR   traMADol (ULTRAM) 50 MG tablet Take 1 to 6 hours for pain that is not relieved by Tylenol or Motrin alone   [DISCONTINUED] albuterol (VENTOLIN HFA) 108 (90 Base) MCG/ACT inhaler Inhale 2 puffs into the lungs every 6 (six) hours as needed for wheezing or shortness of breath.   [DISCONTINUED] cholecalciferol (VITAMIN D3) 25 MCG (1000 UNIT) tablet Take 1,000 Units by mouth daily.   [DISCONTINUED] ORILISSA 150 MG TABS Take 150 mg by mouth at bedtime.   [DISCONTINUED] predniSONE (DELTASONE) 5 MG tablet Begin taking 6 tablets daily, taper by one tablet daily until off the medication. Take with food.   [DISCONTINUED] tiZANidine  (ZANAFLEX) 2 MG tablet Take 2 mg by mouth 3 (three) times daily as needed for muscle spasms.   [DISCONTINUED] vitamin B-12 (CYANOCOBALAMIN) 500 MCG tablet Take 500 mcg by mouth daily.   No facility-administered encounter medications on file as of 11/11/2022.   REVIEW OF SYSTEMS  : All other systems reviewed and negative except where noted in the History of Present Illness.   PHYSICAL EXAM: BP 118/64   Pulse 80   Ht 5\' 4"  (1.626 m)   Wt 146 lb 9.6 oz (66.5 kg)   LMP 10/20/2022 (Approximate)   BMI 25.16 kg/m  General: Well developed white female in no acute distress Head: Normocephalic and atraumatic Eyes:  Sclerae anicteric, conjunctiva pink. Ears: Normal auditory acuity. Lungs: Clear throughout to auscultation; no W/R/R. Heart: Regular rate and rhythm; no M/R/G. Abdomen: Soft, non-distended.  BS present.  Non-tender. Rectal: No significant external abnormalities noted.  DRE did not reveal any tenderness.  Light brown stool noted on the exam glove.  Anoscopy was performed and showed inflamed internal hemorrhoids. Musculoskeletal: Symmetrical with no  gross deformities  Skin: No lesions on visible extremities Extremities: No edema  Neurological: Alert oriented x 4, grossly non-focal Psychological:  Alert and cooperative. Normal mood and affect  ASSESSMENT AND PLAN: *37 year old female with history of IBS, previous endoscopic evaluation unremarkable.  She describes bowel movements going back and forth between constipation and diarrhea, more so maybe on the constipated side recently.  Says that she feels like she does not have complete bowel movements and has a hard time pushing stool out.  She has had longstanding right lower quadrant abdominal/pelvic pain.  She does use a powder that contains soluble fiber on a daily basis.  I have asked her to add MiraLAX into her regimen, 1 capful mixed in 8 ounces of liquid daily.  Will check TSH, sed rate, CRP, fecal calprotectin, and celiac labs today.   I think that she would benefit from pelvis floor PT, will place referral and see how she does with that. *Internal hemorrhoids: Seen on anoscopy today.  Will treat with hydrocortisone suppositories once to twice daily for 7 days intermittently.  If they continue to be an issue then can consider banding.   CC:  [place], [place] Dragon, FNP

## 2022-11-13 LAB — TISSUE TRANSGLUTAMINASE ABS,IGG,IGA
(tTG) Ab, IgA: <1 U/mL
(tTG) Ab, IgG: <1 U/mL

## 2022-11-13 LAB — IGA: Immunoglobulin A: 443 mg/dL — ABNORMAL HIGH (ref 47–310)

## 2022-11-17 ENCOUNTER — Telehealth: Payer: Self-pay | Admitting: Gastroenterology

## 2022-11-17 NOTE — Telephone Encounter (Signed)
The pt states the she is has not completed the stool test because she is now more on the constipated side.  She is taking miralax and stool softener and is beginning to move her bowels.  She will complete the stool test if the diarrhea returns

## 2022-11-17 NOTE — Telephone Encounter (Signed)
Inbound call from patient stating that she is still constipated and has been advised to do stool sample but has not been able to do so. Patient is requesting a call back to discuss. Please advise.

## 2022-11-17 NOTE — Telephone Encounter (Signed)
The pt has been advised and will complete the stool testing as soon as able.

## 2022-11-18 ENCOUNTER — Encounter: Payer: Self-pay | Admitting: Gastroenterology

## 2022-11-18 DIAGNOSIS — K580 Irritable bowel syndrome with diarrhea: Secondary | ICD-10-CM | POA: Insufficient documentation

## 2022-11-18 DIAGNOSIS — K648 Other hemorrhoids: Secondary | ICD-10-CM | POA: Insufficient documentation

## 2022-11-19 NOTE — Progress Notes (Signed)
Attending Physician's Attestation   I have reviewed the chart.   I agree with the Advanced Practitioner's note, impression, and recommendations with any updates as below.    Shloma Roggenkamp Mansouraty, MD [facility] Advanced Endoscopy Office # [phone]  

## 2022-11-23 ENCOUNTER — Telehealth: Payer: Self-pay | Admitting: Gastroenterology

## 2022-11-23 NOTE — Telephone Encounter (Signed)
Left message on machine to call back  

## 2022-11-23 NOTE — Telephone Encounter (Signed)
The pt states that she has had no BM in the past 13 days.  Has bloating and gas.  She is using Suppository's BID, 1 dose of miralax at bedtime with no results.  She tried dulcolax and added a second dose of miralax over the weekend with no results.   She has been advised to do a miralax purge today and call back if she does not have any results.  She will also increase miralax to 2 doses daily and titrate as needed to maintain BM.  Janett Billow any further reqs?

## 2022-11-23 NOTE — Telephone Encounter (Signed)
Patient called back today stating she has been following the recommendations she was given for her constipation.  However, it has been 13 days and she is still constipated and asking what else she needs to be doing.  Please call patient and advise.  Thank you.

## 2022-11-24 ENCOUNTER — Other Ambulatory Visit: Payer: 59

## 2022-11-24 DIAGNOSIS — K648 Other hemorrhoids: Secondary | ICD-10-CM

## 2022-11-24 DIAGNOSIS — K580 Irritable bowel syndrome with diarrhea: Secondary | ICD-10-CM

## 2022-11-24 MED ORDER — LINACLOTIDE 145 MCG PO CAPS
145.0000 ug | ORAL_CAPSULE | Freq: Every day | ORAL | 3 refills | Status: DC
Start: 2022-11-24 — End: 2023-09-25

## 2022-11-24 NOTE — Telephone Encounter (Signed)
The pt has been advised and states she was able to have a BM with the purge.  She also agrees to Linzess 145 mcg prescription.  I have sent this to her pharmacy.  She will call back to update [place] if she has any further concerns.

## 2022-11-28 ENCOUNTER — Ambulatory Visit: Payer: 59 | Admitting: Gastroenterology

## 2022-11-28 ENCOUNTER — Telehealth: Payer: Self-pay | Admitting: *Deleted

## 2022-11-28 NOTE — Telephone Encounter (Signed)
Received fax from [employer] stating PA for nurtec was to expire soon.  Initiated PA for [place] [id number].  Determination pending.

## 2022-11-29 LAB — CALPROTECTIN, FECAL: Calprotectin, Fecal: 30 ug/g (ref 0–120)

## 2022-11-30 ENCOUNTER — Telehealth: Payer: Self-pay | Admitting: *Deleted

## 2022-11-30 NOTE — Telephone Encounter (Signed)
Nurtec denied [employer] fue to pt not having a positive response.  Will call her and check and see if it works for her and if not can try Ubrelvy per MM/NP.

## 2022-12-01 ENCOUNTER — Telehealth: Payer: Self-pay | Admitting: Adult Health

## 2022-12-01 MED ORDER — PREDNISONE 5 MG PO TABS
ORAL_TABLET | ORAL | 0 refills | Status: DC
Start: 2022-12-01 — End: 2024-01-02

## 2022-12-01 NOTE — Telephone Encounter (Signed)
I called pt and relayed that if nurtec not working can do [place].

## 2022-12-01 NOTE — Addendum Note (Signed)
Addended by: Brandon Melnick on: 12/01/2022 02:24 PM   Modules accepted: Orders

## 2022-12-01 NOTE — Addendum Note (Signed)
Addended by: Brandon Melnick on: 12/01/2022 04:47 PM   Modules accepted: Orders

## 2022-12-01 NOTE — Telephone Encounter (Signed)
She is going to try ubrelvy?

## 2022-12-01 NOTE — Telephone Encounter (Signed)
Pt did not get VM.  She says her nurtec wells usually.  So I will need to do a PA again to see if will get approved.  She is asking for acute treatment for her migraine that she has had for a week.  Looks like in past have used prednisone dose pack.  Are you ok to do this ?

## 2022-12-01 NOTE — Telephone Encounter (Signed)
Pt is calling. Stated she has had a migraine headache for a week now. Stated the nurtec is not helping. Pt is requesting a call back from nurse.

## 2022-12-08 NOTE — Telephone Encounter (Signed)
I am re-attempting to do PA on Nurtec   (ref # [id number]).  Determination pending.  I called spoke to [place] with [employer] [phone].

## 2022-12-13 ENCOUNTER — Telehealth: Payer: Self-pay | Admitting: Pharmacy Technician

## 2022-12-13 ENCOUNTER — Other Ambulatory Visit (HOSPITAL_COMMUNITY): Payer: Self-pay

## 2022-12-13 NOTE — Telephone Encounter (Signed)
Patient Advocate Encounter  Received notification from [employer] that prior authorization for [facility] 145MCG is required.   PA submitted on 2.6.24 Key [id number]  Status is pending

## 2022-12-22 MED ORDER — NURTEC 75 MG PO TBDP: ORAL_TABLET | ORAL | 11 refills | Status: DC

## 2022-12-22 NOTE — Addendum Note (Signed)
Addended by: Brandon Melnick on: 12/22/2022 12:06 PM   Modules accepted: Orders

## 2022-12-22 NOTE — Telephone Encounter (Signed)
I called [facility] and PA for Nurtec was approved 21-2024 thru 12-09-2023 PA - [id number] #8/ 30day

## 2022-12-26 ENCOUNTER — Ambulatory Visit: Payer: 59 | Admitting: Physical Therapy

## 2023-01-17 ENCOUNTER — Encounter: Payer: Self-pay | Admitting: Cardiology

## 2023-01-19 ENCOUNTER — Ambulatory Visit: Payer: 59 | Attending: Nurse Practitioner | Admitting: Nurse Practitioner

## 2023-01-19 ENCOUNTER — Encounter: Payer: Self-pay | Admitting: Nurse Practitioner

## 2023-01-19 VITALS — BP 101/70 | HR 78 | Ht 63.0 in | Wt 149.0 lb

## 2023-01-19 DIAGNOSIS — Z87898 Personal history of other specified conditions: Secondary | ICD-10-CM | POA: Diagnosis not present

## 2023-01-19 NOTE — Progress Notes (Signed)
Office Visit    Patient Name: Stacey Compton Date of Encounter: 01/19/2023  PCP:  Olga Coaster, [place]  Cardiologist:  Carlyle Dolly, MD  Advanced Practice Provider:  Finis Bud, NP Electrophysiologist:  None   Chief Complaint    Rashon R Sendelbach is a 37 y.o. female with a hx of dizziness, syncope, anemia, IBS, and migraines, who presents today for regular follow-up.   Past Medical History    Past Medical History:  Diagnosis Date   Anemia    Anxiety    B12 deficiency 02/10/2021   B12 low end of normal patient with significant fatigue will go ahead and treat April 2020   Depression    denies   Endometriosis    Hx of varicella    IBS (irritable bowel syndrome)    Infection    UTI   Migraines    with aura   Migraines    PONV (postoperative nausea and vomiting)    Past Surgical History:  Procedure Laterality Date   BIOPSY  09/11/2020   Procedure: BIOPSY;  Surgeon: Eloise Harman, DO;  [facility];  Service: Endoscopy;;   CHOLECYSTECTOMY N/A 11/18/2019   Procedure: LAPAROSCOPIC CHOLECYSTECTOMY;  Surgeon: Virl Cagey, MD;  Location: [facility];  Service: General;  Laterality: N/A;   COLONOSCOPY WITH PROPOFOL N/A 09/11/2020   Procedure: COLONOSCOPY WITH PROPOFOL;  Surgeon: Eloise Harman, DO;  [facility];  Service: Endoscopy;  Laterality: N/A;  10:30am   ESOPHAGOGASTRODUODENOSCOPY (EGD) WITH PROPOFOL N/A 09/11/2020   Procedure: ESOPHAGOGASTRODUODENOSCOPY (EGD) WITH PROPOFOL;  Surgeon: Eloise Harman, DO;  [facility];  Service: Endoscopy;  Laterality: N/A;   LAPAROSCOPY     with chromopertubation   LAPAROSCOPY N/A 12/22/2021   Procedure: LAPAROSCOPY DIAGNOSTIC, APPENDECTOMY;  Surgeon: Virl Cagey, MD;  Location: [facility];  Service: General;  Laterality: N/A;   OTHER SURGICAL HISTORY     dx lap for endometriosis    Allergies  Allergies  Allergen Reactions   Hydrocodone  Bit-Homatrop Mbr Nausea And Vomiting and Rash    Hycodan    Percocet [Oxycodone-Acetaminophen] Hives and Rash    History of Present Illness    Stacey Compton is a delightful 37 y.o. female with a PMH as mentioned above.  Has history of multiple episodes of syncope.  Had thorough evaluation done at [facility].  Echo was benign.  Monitor result was overall benign, nothing to indicate explanation for passing out.  Has been managed with Florinef.  Last seen by Dr. Carlyle Dolly on Mar 30, 2021.  Patient did report multiple syncopal episodes, not orthostatic in nature.    Today she presents for follow-up as this was requested by another provider.  Doing well from a cardiac perspective. Denies any chest pain, shortness of breath, palpitations, syncope, presyncope, dizziness, orthopnea, PND, swelling or significant weight changes, acute bleeding, or claudication.  Blood pressure tends to run soft.  Denies any symptoms.  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 In her free time, she enjoys watching movies.  EKGs/Labs/Other Studies Reviewed:   The following studies were reviewed today:   EKG:  EKG is ordered today.  The ekg ordered today demonstrates normal sinus rhythm, 78 bpm, nonspecific T wave abnormality, no acute ischemic changes.  Recent Labs: 11/04/2022: ALT 27; BUN 5; Creatinine, Ser 0.62; Hemoglobin 13.5; Platelets 264; Potassium 4.0; Sodium 138 11/11/2022: TSH 2.50  Recent  Lipid Panel No results found for: "CHOL", "TRIG", "HDL", "CHOLHDL", "VLDL", "LDLCALC", "LDLDIRECT"   Home Medications   Current Meds  Medication Sig   acidophilus (RISAQUAD) CAPS capsule Take 1 capsule by mouth daily.   citalopram (CELEXA) 20 MG tablet TAKE 1 TABLET(20 MG) BY MOUTH AT BEDTIME   clonazePAM (KLONOPIN) 0.5 MG tablet TAKE 1/2 TO 1 (ONE-HALF TO ONE) TABLET BY MOUTH TWICE DAILY AS NEEDED FOR ANXIETY   dicyclomine (BENTYL) 10 MG capsule Take 1 capsule (10 mg total) by  mouth 4 (four) times daily -  before meals and at bedtime. (Patient taking differently: Take 10 mg by mouth 4 (four) times daily as needed for spasms.)   fludrocortisone (FLORINEF) 0.1 MG tablet Take 1 tablet by mouth once daily   pantoprazole (PROTONIX) 40 MG tablet Take 1 tablet (40 mg total) by mouth daily. TAKE 1 TABLET(40 MG) BY MOUTH DAILY Strength: 40 mg   Rimegepant Sulfate (NURTEC) 75 MG TBDP TAKE 1 TABLET BY MOUTH AT ONSET OF MIGRAINE AND MAY REPEAT 1 TABLET IN 24 HOUR     Review of Systems    All other systems reviewed and are otherwise negative except as noted above.  Physical Exam    VS:  BP 101/70 (BP Location: Left Arm, Patient Position: Sitting, Cuff Size: Normal)   Pulse 78   Ht '5\' 3"'$  (1.6 m)   Wt 149 lb (67.6 kg)   SpO2 97%   BMI 26.39 kg/m  , BMI Body mass index is 26.39 kg/m.  Wt Readings from Last 3 Encounters:  01/19/23 149 lb (67.6 kg)  11/11/22 146 lb 9.6 oz (66.5 kg)  11/04/22 148 lb (67.1 kg)     GEN: Well nourished, well developed, in no acute distress. HEENT: normal. Neck: Supple, no JVD, carotid bruits, or masses. Cardiac: S1/S2, RRR, no murmurs, rubs, or gallops. No clubbing, cyanosis, edema.  Radials/PT 2+ and equal bilaterally.  Respiratory:  Respirations regular and unlabored, clear to auscultation bilaterally. MS: No deformity or atrophy. Skin: Warm and dry, no rash. Neuro:  Strength and sensation are intact. Psych: Normal affect.  Assessment & Plan    History of dizziness/syncope Denies any recurrent symptoms since last visit.  Doing well on Florinef. Most recent labs stable. She states that it was discovered that these episodes were GI related.  Continue follow-up with PCP regularly.  Discussed with her to continue to closely monitor blood pressure, and if blood pressure runs low/she develops symptoms, to give our office a call, would plan on increasing Florinef dosage at that point.  No medication changes at this time. Heart healthy diet  and regular cardiovascular exercise encouraged.   Disposition: Follow up prn with Carlyle Dolly, MD or APP.  Signed, Finis Bud, NP 01/19/2023, 10:06 AM [place]

## 2023-01-19 NOTE — Patient Instructions (Signed)
Medication Instructions:  Your physician recommends that you continue on your current medications as directed. Please refer to the Current Medication list given to you today.  *If you need a refill on your cardiac medications before your next appointment, please call your pharmacy*   Lab Work: None If you have labs (blood work) drawn today and your tests are completely normal, you will receive your results only by: [place] (if you have MyChart) OR A paper copy in the mail If you have any lab test that is abnormal or we need to change your treatment, we will call you to review the results.   Testing/Procedures: None   Follow-Up: At [facility], you and your health needs are our priority.  As part of our continuing mission to provide you with exceptional heart care, we have created designated Provider Care Teams.  These Care Teams include your primary Cardiologist (physician) and Advanced Practice Providers (APPs -  Physician Assistants and Nurse Practitioners) who all work together to provide you with the care you need, when you need it.  We recommend signing up for the patient portal called "MyChart".  Sign up information is provided on this After Visit Summary.  MyChart is used to connect with patients for Virtual Visits (Telemedicine).  Patients are able to view lab/test results, encounter notes, upcoming appointments, etc.  Non-urgent messages can be sent to your provider as well.   To learn more about what you can do with MyChart, go to [email or web address].    Your next appointment:   Follow up as needed with Finis Bud, NP or Dr. Harl Bowie.  Other Instructions

## 2023-01-20 ENCOUNTER — Telehealth: Payer: Self-pay | Admitting: Pharmacy Technician

## 2023-01-20 NOTE — Telephone Encounter (Signed)
Patient Advocate Encounter  Received notification from [employer] that prior authorization for [facility] 145MCG is required.   PA submitted on 3.15.24 Key [id number] Status is pending

## 2023-01-20 NOTE — Telephone Encounter (Signed)
PA was originally denied, however resubmitted with a different diagnosis as expedited. Telephone encounter has been created.

## 2023-01-23 NOTE — Telephone Encounter (Signed)
PA has been APPROVED from 01/20/2023-01/20/2024

## 2023-01-26 ENCOUNTER — Ambulatory Visit: Payer: 59

## 2023-05-02 ENCOUNTER — Telehealth: Payer: 59 | Admitting: Adult Health

## 2023-05-02 DIAGNOSIS — G43909 Migraine, unspecified, not intractable, without status migrainosus: Secondary | ICD-10-CM | POA: Diagnosis not present

## 2023-05-02 DIAGNOSIS — G43109 Migraine with aura, not intractable, without status migrainosus: Secondary | ICD-10-CM

## 2023-05-02 MED ORDER — NURTEC 75 MG PO TBDP: ORAL_TABLET | ORAL | 11 refills | Status: DC

## 2023-05-02 NOTE — Progress Notes (Signed)
PATIENT: Stacey Compton DOB: 07-17-1986  REASON FOR VISIT: follow up HISTORY FROM: patient  Virtual Visit via Video Note  I connected with Khamil R Ureta on 05/02/23 at  3:00 PM EDT by a video enabled telemedicine application located remotely at [facility] and verified that I am speaking with the correct person using two identifiers who was located at their own home.   I discussed the limitations of evaluation and management by telemedicine and the availability of in person appointments. The patient expressed understanding and agreed to proceed.   PATIENT: [name] DOB: May 08, 1986  REASON FOR VISIT: follow up HISTORY FROM: patient  HISTORY OF PRESENT ILLNESS: Today 05/02/23:  [name] is a 37 y.o. female with a history of migraine headaches. Returns today for follow-up.  She continues to use only Nurtec.  This is working well for her.  Has a headache either 1 time a month or every other month.  Denies any new issues.  She returns today for evaluation.    05/03/22: [name] is a 37 year old female with a history of Migraine headaches. She returns today for follow-up. Not on a preventative medication. Only uses nutrec once a  month. Reports that she has implemented diet changes due to GI issues. Sleep patterns have improved. Overall she feels that she is doing much better.   HISTORY 11/29/21:   [name] is a 37 year old female with a history of migraine headaches.  She returns today with an intractable migraine. States that has been ongoing for the last 20 days.  She states that she is also been having some stomach issues.  Her GI doctor may take her appendix out per patient.  States that she has a stone there.  She is waiting to hear back.  States that she has been taking ibuprofen or Tylenol on a daily basis due to discomfort from her stomach.  States that Nurtec has not been working for her headaches.  Reports that in the past she did receive  an injection from our office that she found beneficial.  Returns today for an evaluation.  REVIEW OF SYSTEMS: Out of a complete 14 system review of symptoms, the patient complains only of the following symptoms, and all other reviewed systems are negative.  ALLERGIES: Allergies  Allergen Reactions   Hydrocodone Bit-Homatrop Mbr Nausea And Vomiting and Rash    Hycodan    Percocet [Oxycodone-Acetaminophen] Hives and Rash    HOME MEDICATIONS: Outpatient Medications Prior to Visit  Medication Sig Dispense Refill   acetaminophen (TYLENOL) 325 MG tablet 1 tablet as needed Orally every 4 hrs     acidophilus (RISAQUAD) CAPS capsule Take 1 capsule by mouth daily.     citalopram (CELEXA) 20 MG tablet TAKE 1 TABLET(20 MG) BY MOUTH AT BEDTIME 30 tablet 2   clonazePAM (KLONOPIN) 0.5 MG tablet TAKE 1/2 TO 1 (ONE-HALF TO ONE) TABLET BY MOUTH TWICE DAILY AS NEEDED FOR ANXIETY 24 tablet 1   Cobalamin Combinations (B12 FOLATE) 800-800 MCG CAPS as directed Orally     dicyclomine (BENTYL) 10 MG capsule Take 1 capsule (10 mg total) by mouth 4 (four) times daily -  before meals and at bedtime. (Patient taking differently: Take 10 mg by mouth 4 (four) times daily as needed for spasms.) 120 capsule 5   etonogestrel (NEXPLANON) 68 MG IMPL implant as directed Subcutaneous     fludrocortisone (FLORINEF) 0.1 MG tablet Take 1 tablet by mouth once daily 30  tablet 0   hydrocortisone (ANUSOL-HC) 25 MG suppository Place 1 suppository (25 mg total) rectally 2 (two) times daily. 14 suppository 2   linaclotide (LINZESS) 145 MCG CAPS capsule Take 1 capsule (145 mcg total) by mouth daily before breakfast. (Patient not taking: Reported on 01/19/2023) 30 capsule 3   pantoprazole (PROTONIX) 40 MG tablet Take 1 tablet (40 mg total) by mouth daily. TAKE 1 TABLET(40 MG) BY MOUTH DAILY Strength: 40 mg 30 tablet 11   predniSONE (DELTASONE) 5 MG tablet Begin taking 6 tablets daily, taper by one tablet daily until off the medication.  Take with food. 21 tablet 0   Rimegepant Sulfate (NURTEC) 75 MG TBDP TAKE 1 TABLET BY MOUTH AT ONSET OF MIGRAINE AND MAY REPEAT 1 TABLET IN 24 HOUR 8 tablet 11   traMADol (ULTRAM) 50 MG tablet Take 1 to 6 hours for pain that is not relieved by Tylenol or Motrin alone 20 tablet 0   VITAMIN D, CHOLECALCIFEROL, PO 1 capsule Orally     No facility-administered medications prior to visit.    PAST MEDICAL HISTORY: Past Medical History:  Diagnosis Date   Anemia    Anxiety    B12 deficiency 02/10/2021   B12 low end of normal patient with significant fatigue will go ahead and treat April 2020   Depression    denies   Endometriosis    Hx of varicella    IBS (irritable bowel syndrome)    Infection    UTI   Migraines    with aura   Migraines    PONV (postoperative nausea and vomiting)     PAST SURGICAL HISTORY: Past Surgical History:  Procedure Laterality Date   BIOPSY  09/11/2020   Procedure: BIOPSY;  Surgeon: Lanelle Bal, DO;  [facility];  Service: Endoscopy;;   CHOLECYSTECTOMY N/A 11/18/2019   Procedure: LAPAROSCOPIC CHOLECYSTECTOMY;  Surgeon: [name], MD;  Location: [facility];  Service: General;  Laterality: N/A;   COLONOSCOPY WITH PROPOFOL N/A 09/11/2020   Procedure: COLONOSCOPY WITH PROPOFOL;  Surgeon: Lanelle Bal, DO;  [facility];  Service: Endoscopy;  Laterality: N/A;  10:30am   ESOPHAGOGASTRODUODENOSCOPY (EGD) WITH PROPOFOL N/A 09/11/2020   Procedure: ESOPHAGOGASTRODUODENOSCOPY (EGD) WITH PROPOFOL;  Surgeon: Lanelle Bal, DO;  [facility];  Service: Endoscopy;  Laterality: N/A;   LAPAROSCOPY     with chromopertubation   LAPAROSCOPY N/A 12/22/2021   Procedure: LAPAROSCOPY DIAGNOSTIC, APPENDECTOMY;  Surgeon: [name], MD;  Location: [facility];  Service: General;  Laterality: N/A;   OTHER SURGICAL HISTORY     dx lap for endometriosis    FAMILY HISTORY: Family History  Problem Relation Age of Onset    Fibromyalgia Mother    Diabetes Mother        borderline   Depression Father    Diabetes Father        type 2   Colon polyps Father        >69   Anxiety disorder Sister    Crohn's disease Sister    Bipolar disorder Maternal Aunt    Cancer Maternal Grandmother        mouth and lung   Bipolar disorder Maternal Grandfather    Stroke Maternal Grandfather    Migraines Maternal Grandfather    Heart disease Paternal Grandfather    Colon cancer Cousin        maternal   Celiac disease Neg Hx     SOCIAL HISTORY: Social History  Socioeconomic History   Marital status: Married    Spouse name: Not on file   Number of children: Not on file   Years of education: Not on file   Highest education level: Not on file  Occupational History   Not on file  Tobacco Use   Smoking status: Never   Smokeless tobacco: Never  Vaping Use   Vaping Use: Never used  Substance and Sexual Activity   Alcohol use: No   Drug use: No   Sexual activity: Yes    Birth control/protection: None  Other Topics Concern   Not on file  Social History Narrative   Not on file   Social Determinants of Health   Financial Resource Strain: Not on file  Food Insecurity: Not on file  Transportation Needs: Not on file  Physical Activity: Not on file  Stress: Not on file  Social Connections: Not on file  Intimate Partner Violence: Not on file      PHYSICAL EXAM Generalized: Well developed, in no acute distress   Neurological examination  Mentation: Alert oriented to time, place, history taking. Follows all commands speech and language fluent Cranial nerve II-XII: facial symmetry noted   DIAGNOSTIC DATA (LABS, IMAGING, TESTING) - I reviewed patient records, labs, notes, testing and imaging myself where available.  Lab Results  Component Value Date   WBC 8.2 11/04/2022   HGB 13.5 11/04/2022   HCT 40.0 11/04/2022   MCV 95.9 11/04/2022   PLT 264 11/04/2022      Component Value Date/Time   NA 138  11/04/2022 1224   NA 140 04/14/2021 1340   K 4.0 11/04/2022 1224   CL 106 11/04/2022 1224   CO2 27 11/04/2022 1224   GLUCOSE 112 (H) 11/04/2022 1224   BUN 5 (L) 11/04/2022 1224   BUN 5 (L) 04/14/2021 1340   CREATININE 0.62 11/04/2022 1224   CALCIUM 9.1 11/04/2022 1224   PROT 7.3 11/04/2022 1224   PROT 7.6 12/04/2020 1138   ALBUMIN 4.1 11/04/2022 1224   ALBUMIN 4.7 12/04/2020 1138   AST 26 11/04/2022 1224   ALT 27 11/04/2022 1224   ALKPHOS 41 11/04/2022 1224   BILITOT 0.6 11/04/2022 1224   BILITOT 0.5 12/04/2020 1138   GFRNONAA >60 11/04/2022 1224   GFRAA 131 12/04/2020 1138    Lab Results  Component Value Date   VITAMINB12 707 04/14/2021   Lab Results  Component Value Date   TSH 2.50 11/11/2022      ASSESSMENT AND PLAN 38 y.o. year old female  has a past medical history of Anemia, Anxiety, B12 deficiency (02/10/2021), Depression, Endometriosis, varicella, IBS (irritable bowel syndrome), Infection, Migraines, Migraines, and PONV (postoperative nausea and vomiting). here with:  Migraine headaches   - Continue Nurtec for abortive therapy -Advised that if her primary care is amenable to managing Nurtec in the future she can follow-up with our office on an as-needed basis.    Butch Penny, MSN, NP-C 05/02/2023, 3:02 PM [facility] [address], [address] [place], [place] [address] [phone]

## 2023-08-02 ENCOUNTER — Other Ambulatory Visit (HOSPITAL_COMMUNITY): Payer: Self-pay

## 2023-08-02 ENCOUNTER — Telehealth: Payer: Self-pay | Admitting: Pharmacy Technician

## 2023-08-02 NOTE — Telephone Encounter (Signed)
Pharmacy Patient Advocate Encounter   Received notification from CoverMyMeds that prior authorization for Nurtec 75MG  dispersible tablets is required/requested.   Insurance verification completed.   The patient is insured through [employer] [facility] .   Per test claim: PA required; PA submitted to [employer] [facility] via CoverMyMeds Key/confirmation #/EOC [id number] Status is pending

## 2023-08-04 ENCOUNTER — Other Ambulatory Visit (HOSPITAL_COMMUNITY): Payer: Self-pay

## 2023-08-04 NOTE — Telephone Encounter (Signed)
Pharmacy Patient Advocate Encounter  Received notification from [employer] [facility] that Prior Authorization for Nurtec 75MG  dispersible tablets has been APPROVED from 08/02/2023 to 07/31/2024. Ran test claim, Copay is $Unable to obtain due to refill too soon rejection-last filled on 08/03/2023, next fill due 08/23/2023. This test claim was processed through [facility]- copay amounts may vary at other pharmacies due to pharmacy/plan contracts, or as the patient moves through the different stages of their insurance plan.   PA #/Case ID/Reference #: PA Case ID #: [id number]

## 2023-09-24 ENCOUNTER — Other Ambulatory Visit: Payer: Self-pay | Admitting: Gastroenterology

## 2023-10-26 ENCOUNTER — Other Ambulatory Visit: Payer: Self-pay | Admitting: Gastroenterology

## 2023-11-19 ENCOUNTER — Other Ambulatory Visit: Payer: Self-pay | Admitting: Gastroenterology

## 2023-11-20 ENCOUNTER — Other Ambulatory Visit: Payer: Self-pay | Admitting: Gastroenterology

## 2024-01-02 ENCOUNTER — Ambulatory Visit: Payer: 59 | Admitting: Gastroenterology

## 2024-01-02 ENCOUNTER — Encounter: Payer: Self-pay | Admitting: Gastroenterology

## 2024-01-02 VITALS — BP 104/72 | HR 82 | Ht 63.0 in | Wt 157.2 lb

## 2024-01-02 DIAGNOSIS — K581 Irritable bowel syndrome with constipation: Secondary | ICD-10-CM

## 2024-01-02 DIAGNOSIS — K580 Irritable bowel syndrome with diarrhea: Secondary | ICD-10-CM

## 2024-01-02 DIAGNOSIS — K648 Other hemorrhoids: Secondary | ICD-10-CM | POA: Diagnosis not present

## 2024-01-02 MED ORDER — DICYCLOMINE HCL 10 MG PO CAPS: 10.0000 mg | ORAL_CAPSULE | Freq: Two times a day (BID) | ORAL | 2 refills | Status: AC

## 2024-01-02 NOTE — Progress Notes (Signed)
 Attending Physician's Attestation   I have reviewed the chart.   I agree with the Advanced Practitioner's note, impression, and recommendations with any updates as below.    Corliss Parish, MD [facility] Advanced Endoscopy Office # [phone]

## 2024-01-02 NOTE — Patient Instructions (Signed)
 We have sent the following medications to your pharmacy for you to pick up at your convenience: Dicyclomine 10 mg twice daily.   Increase Linzess 290 mcg daily before breakfast.   Call or send a Mychart message if you want to proceed with hemorrhoid banding and/or pelvic floor therapy.   _______________________________________________________  If your blood pressure at your visit was 140/90 or greater, please contact your primary care physician to follow up on this.  _______________________________________________________  If you are age 40 or older, your body mass index should be between 23-30. Your Body mass index is 27.85 kg/m. If this is out of the aforementioned range listed, please consider follow up with your Primary Care Provider.  If you are age 23 or younger, your body mass index should be between 19-25. Your Body mass index is 27.85 kg/m. If this is out of the aformentioned range listed, please consider follow up with your Primary Care Provider.   ________________________________________________________  The [facility] providers would like to encourage you to use [facility] to communicate with providers for non-urgent requests or questions.  Due to long hold times on the telephone, sending your provider a message by [facility] may be a faster and more efficient way to get a response.  Please allow 48 business hours for a response.  Please remember that this is for non-urgent requests.  _______________________________________________________

## 2024-01-02 NOTE — Progress Notes (Signed)
 01/02/2024 Stacey Compton [id number] 20-Mar-1986   HISTORY OF PRESENT ILLNESS: This is a 38 year old female who is a patient of Dr. Elesa Hacker.  She was assigned to him when she was seen by me in January 2024.  Has a history of IBS, previous endoscopic evaluation unremarkable.  She used to follow-up with Dr. Marletta Lor and had colonoscopy and EGD November 2021.  Colonoscopy showed nonbleeding internal hemorrhoids.  EGD showed gastritis.  All random colonic biopsies and terminal ileal biopsies were negative/normal.  Gastric biopsy showed mild reactive gastropathy and mild chronic gastritis, negative for H. pylori.  Duodenal biopsies showed peptic duodenitis.  She has had 3 CT scans over the past 4 years.  When I saw her last I ordered inflammatory markers including a fecal calprotectin, celiac labs, etc, that were normal.  I had started her on Linzess 145 mcg daily.  She says that she did really well with that for a while, but then it just kind of stopped working.  She complains of constant dull pain on the right side of her abdomen that increases at times.  Takes Tylenol and uses hot baths, some topical treatments, etc.  Says that she has some constant baseline nausea, not severe enough to take anything.  She does see mucus and blood from her bottom.  Follows on anti-inflammatory diet.  Past Medical History:  Diagnosis Date   Anemia    Anxiety    B12 deficiency 02/10/2021   B12 low end of normal patient with significant fatigue will go ahead and treat April 2020   Depression    denies   Endometriosis    Hx of varicella    IBS (irritable bowel syndrome)    Infection    UTI   Migraines    with aura   Migraines    PONV (postoperative nausea and vomiting)    Past Surgical History:  Procedure Laterality Date   BIOPSY  09/11/2020   Procedure: BIOPSY;  Surgeon: Lanelle Bal, DO;  [facility]: [facility];  Service: Endoscopy;;   CHOLECYSTECTOMY N/A 11/18/2019   Procedure:  LAPAROSCOPIC CHOLECYSTECTOMY;  Surgeon: [name], MD;  Location: [facility];  Service: General;  Laterality: N/A;   COLONOSCOPY WITH PROPOFOL N/A 09/11/2020   Procedure: COLONOSCOPY WITH PROPOFOL;  Surgeon: Lanelle Bal, DO;  Location: [facility];  Service: Endoscopy;  Laterality: N/A;  10:30am   ESOPHAGOGASTRODUODENOSCOPY (EGD) WITH PROPOFOL N/A 09/11/2020   Procedure: ESOPHAGOGASTRODUODENOSCOPY (EGD) WITH PROPOFOL;  Surgeon: Lanelle Bal, DO;  Location: [facility];  Service: Endoscopy;  Laterality: N/A;   LAPAROSCOPY     with chromopertubation   LAPAROSCOPY N/A 12/22/2021   Procedure: LAPAROSCOPY DIAGNOSTIC, APPENDECTOMY;  Surgeon: [name], MD;  Location: [facility];  Service: General;  Laterality: N/A;   OTHER SURGICAL HISTORY     dx lap for endometriosis    reports that she has never smoked. She has never used smokeless tobacco. She reports that she does not drink alcohol and does not use drugs. family history includes Anxiety disorder in her sister; Bipolar disorder in her maternal aunt and maternal grandfather; Cancer in her maternal grandmother; Colon cancer in her cousin; Colon polyps in her father; Crohn's disease in her sister; Depression in her father; Diabetes in her father and mother; Fibromyalgia in her mother; Heart disease in her paternal grandfather; Migraines in her maternal grandfather; Stroke in her maternal grandfather. Allergies  Allergen Reactions   Hydrocodone Bit-Homatrop Mbr Nausea And Vomiting and  Rash    Hycodan    Percocet [Oxycodone-Acetaminophen] Hives and Rash      Outpatient Encounter Medications as of 01/02/2024  Medication Sig   citalopram (CELEXA) 20 MG tablet TAKE 1 TABLET(20 MG) BY MOUTH AT BEDTIME   clonazePAM (KLONOPIN) 0.5 MG tablet TAKE 1/2 TO 1 (ONE-HALF TO ONE) TABLET BY MOUTH TWICE DAILY AS NEEDED FOR ANXIETY   LINZESS 145 MCG CAPS capsule TAKE 1 CAPSULE BY MOUTH ONCE DAILY BEFORE BREAKFAST   Rimegepant Sulfate (NURTEC)  75 MG TBDP TAKE 1 TABLET BY MOUTH AT ONSET OF MIGRAINE AND MAY REPEAT 1 TABLET IN 24 HOUR   pantoprazole (PROTONIX) 40 MG tablet Take 1 tablet (40 mg total) by mouth daily. TAKE 1 TABLET(40 MG) BY MOUTH DAILY Strength: 40 mg   [DISCONTINUED] acetaminophen (TYLENOL) 325 MG tablet 1 tablet as needed Orally every 4 hrs   [DISCONTINUED] acidophilus (RISAQUAD) CAPS capsule Take 1 capsule by mouth daily.   [DISCONTINUED] Cobalamin Combinations (B12 FOLATE) 800-800 MCG CAPS as directed Orally   [DISCONTINUED] dicyclomine (BENTYL) 10 MG capsule Take 1 capsule (10 mg total) by mouth 4 (four) times daily -  before meals and at bedtime. (Patient taking differently: Take 10 mg by mouth 4 (four) times daily as needed for spasms.)   [DISCONTINUED] etonogestrel (NEXPLANON) 68 MG IMPL implant as directed Subcutaneous   [DISCONTINUED] fludrocortisone (FLORINEF) 0.1 MG tablet Take 1 tablet by mouth once daily   [DISCONTINUED] hydrocortisone (ANUSOL-HC) 25 MG suppository Place 1 suppository (25 mg total) rectally 2 (two) times daily.   [DISCONTINUED] predniSONE (DELTASONE) 5 MG tablet Begin taking 6 tablets daily, taper by one tablet daily until off the medication. Take with food.   [DISCONTINUED] traMADol (ULTRAM) 50 MG tablet Take 1 to 6 hours for pain that is not relieved by Tylenol or Motrin alone   [DISCONTINUED] VITAMIN D, CHOLECALCIFEROL, PO 1 capsule Orally   No facility-administered encounter medications on file as of 01/02/2024.    REVIEW OF SYSTEMS  : All other systems reviewed and negative except where noted in the History of Present Illness.   PHYSICAL EXAM: BP 104/72   Pulse 82   Ht 5\' 3"  (1.6 m)   Wt 157 lb 3.2 oz (71.3 kg)   BMI 27.85 kg/m  General: Well developed white female in no acute distress Head: Normocephalic and atraumatic Eyes:  Sclerae anicteric, conjunctiva pink. Ears: Normal auditory acuity Lungs: Clear throughout to auscultation; no W/R/R. Heart: Regular rate and rhythm; no  M/R/G. Abdomen: Soft, non-distended.  BS present.  Mild diffuse TTP. Musculoskeletal: Symmetrical with no gross deformities  Skin: No lesions on visible extremities Extremities: No edema  Neurological: Alert oriented x 4, grossly non-focal Psychological:  Alert and cooperative. Normal mood and affect  ASSESSMENT AND PLAN: *38 year old female with history of IBS, bowel movements switching back-and-forth between constipation and diarrhea, but constipated more so over the last couple of years.  Initially did well on Linzess 145 mcg daily.  Will increase that to 290 mcg daily.  Previous endoscopic evaluation in November 2021, 3 CT scans of the last few years, inflammatory markers including a fecal calprotectin, celiac labs, etc. have been normal.  We discussed pelvic floor physical therapy, but she was not able to make that work with her schedule so has not been able to proceed with that at this point.  Will try some dicyclomine 10 mg twice daily to start.  Prescription sent to pharmacy.  Can titrate the dose up if needed. *Internal hemorrhoids: Seen  on previous colonoscopy and also anoscopy when she was examined last year.  Has hydrocortisone suppositories that she uses periodically.  Still sees bright red blood and mucus.  We discussed banding and she was given literature on this.  She will look into it and decide if she would like to proceed.   CC:  Bucio, Julian Reil, FNP

## 2024-01-12 ENCOUNTER — Other Ambulatory Visit: Payer: Self-pay | Admitting: Gastroenterology

## 2024-01-15 ENCOUNTER — Other Ambulatory Visit: Payer: Self-pay

## 2024-01-15 MED ORDER — LINACLOTIDE 290 MCG PO CAPS: 290.0000 ug | ORAL_CAPSULE | Freq: Every day | ORAL | 0 refills | Status: DC

## 2024-01-23 ENCOUNTER — Other Ambulatory Visit (HOSPITAL_COMMUNITY): Payer: Self-pay

## 2024-01-24 ENCOUNTER — Other Ambulatory Visit (HOSPITAL_COMMUNITY): Payer: Self-pay

## 2024-01-24 ENCOUNTER — Telehealth: Payer: Self-pay

## 2024-01-24 NOTE — Telephone Encounter (Signed)
 Pharmacy Patient Advocate Encounter   Received notification from Pt Calls Messages that prior authorization for Linzess capsules is required/requested.   Insurance verification completed.   The patient is insured through [facility] .   Per test claim: PA not required

## 2024-01-25 ENCOUNTER — Emergency Department (HOSPITAL_BASED_OUTPATIENT_CLINIC_OR_DEPARTMENT_OTHER)

## 2024-01-25 ENCOUNTER — Encounter (HOSPITAL_BASED_OUTPATIENT_CLINIC_OR_DEPARTMENT_OTHER): Payer: Self-pay

## 2024-01-25 ENCOUNTER — Other Ambulatory Visit: Payer: Self-pay

## 2024-01-25 ENCOUNTER — Emergency Department (HOSPITAL_BASED_OUTPATIENT_CLINIC_OR_DEPARTMENT_OTHER)
Admission: EM | Admit: 2024-01-25 | Discharge: 2024-01-25 | Disposition: A | Discharge status: Home / Self Care | Attending: Emergency Medicine | Admitting: Emergency Medicine

## 2024-01-25 DIAGNOSIS — R202 Paresthesia of skin: Secondary | ICD-10-CM | POA: Diagnosis not present

## 2024-01-25 DIAGNOSIS — R200 Anesthesia of skin: Secondary | ICD-10-CM | POA: Diagnosis not present

## 2024-01-25 DIAGNOSIS — R531 Weakness: Secondary | ICD-10-CM | POA: Insufficient documentation

## 2024-01-25 LAB — CBC
HCT: 42.7 % (ref 36.0–46.0)
Hemoglobin: 14.6 g/dL (ref 12.0–15.0)
MCH: 32.2 pg (ref 26.0–34.0)
MCHC: 34.2 g/dL (ref 30.0–36.0)
MCV: 94.1 fL (ref 80.0–100.0)
Platelets: 288 10*3/uL (ref 150–400)
RBC: 4.54 MIL/uL (ref 3.87–5.11)
RDW: 12 % (ref 11.5–15.5)
WBC: 8.3 10*3/uL (ref 4.0–10.5)
nRBC: 0 % (ref 0.0–0.2)

## 2024-01-25 LAB — CORTISOL: Cortisol, Plasma: 6.9 ug/dL

## 2024-01-25 LAB — BASIC METABOLIC PANEL
Anion gap: 6 (ref 5–15)
BUN: 7 mg/dL (ref 6–20)
CO2: 29 mmol/L (ref 22–32)
Calcium: 9.2 mg/dL (ref 8.9–10.3)
Chloride: 99 mmol/L (ref 98–111)
Creatinine, Ser: 0.71 mg/dL (ref 0.44–1.00)
GFR, Estimated: >60 mL/min (ref >60)
Glucose, Bld: 101 mg/dL — ABNORMAL HIGH (ref 70–99)
Potassium: 5.4 mmol/L — ABNORMAL HIGH (ref 3.5–5.1)
Sodium: 134 mmol/L — ABNORMAL LOW (ref 135–145)

## 2024-01-25 LAB — RESP PANEL BY RT-PCR (RSV, FLU A&B, COVID)  RVPGX2
Influenza A by PCR: NEGATIVE
Influenza B by PCR: NEGATIVE
Resp Syncytial Virus by PCR: NEGATIVE
SARS Coronavirus 2 by RT PCR: NEGATIVE

## 2024-01-25 LAB — PREGNANCY, URINE: Preg Test, Ur: NEGATIVE

## 2024-01-25 LAB — C-REACTIVE PROTEIN: CRP: <0.5 mg/dL (ref <1.0)

## 2024-01-25 LAB — SEDIMENTATION RATE: Sed Rate: 8 mm/h (ref 0–22)

## 2024-01-25 LAB — TSH: TSH: 1.908 u[IU]/mL (ref 0.350–4.500)

## 2024-01-25 LAB — CBG MONITORING, ED: Glucose-Capillary: 118 mg/dL — ABNORMAL HIGH (ref 70–99)

## 2024-01-25 MED ORDER — METOCLOPRAMIDE HCL 5 MG/ML IJ SOLN
10.0000 mg | Freq: Once | INTRAMUSCULAR | Status: AC
  Administered 2024-01-25: 10 mg via INTRAVENOUS
  Filled 2024-01-25: qty 2

## 2024-01-25 MED ORDER — KETOROLAC TROMETHAMINE 15 MG/ML IJ SOLN
15.0000 mg | Freq: Once | INTRAMUSCULAR | Status: AC
  Administered 2024-01-25: 15 mg via INTRAVENOUS
  Filled 2024-01-25: qty 1

## 2024-01-25 MED ORDER — DIPHENHYDRAMINE HCL 50 MG/ML IJ SOLN
25.0000 mg | Freq: Once | INTRAMUSCULAR | Status: AC
  Administered 2024-01-25: 25 mg via INTRAVENOUS
  Filled 2024-01-25: qty 1

## 2024-01-25 MED ORDER — SODIUM CHLORIDE 0.9 % IV BOLUS
1000.0000 mL | Freq: Once | INTRAVENOUS | Status: AC
  Administered 2024-01-25: 1000 mL via INTRAVENOUS

## 2024-01-25 MED ORDER — HYDROCORTISONE SOD SUC (PF) 100 MG IJ SOLR
100.0000 mg | Freq: Once | INTRAMUSCULAR | Status: AC
  Administered 2024-01-25: 100 mg via INTRAVENOUS
  Filled 2024-01-25: qty 2

## 2024-01-25 MED ORDER — GADOBUTROL 1 MMOL/ML IV SOLN
7.0000 mL | Freq: Once | INTRAVENOUS | Status: AC | PRN
  Administered 2024-01-25: 7 mL via INTRAVENOUS

## 2024-01-25 NOTE — ED Notes (Signed)
 Spoke with lab about adding on additional labs

## 2024-01-25 NOTE — ED Provider Notes (Signed)
 [place] EMERGENCY DEPARTMENT AT [facility] Provider Note   CSN: [id number] Arrival date & time: 01/25/24  1303     History  Chief Complaint  Patient presents with   Weakness   Near Syncope    Stacey Compton is a 38 y.o. female.  HPI   38 year old female presents emergency department with a few different complaints.  Patient reports generalized weakness, decree sensation in hands and feet.  Patient states that she has been dealing symptoms similar intermittent for the past couple of years.  Does follow with neurosurgery, cardiology in the outpatient setting.  Regarding decrease sensation, states that has been present since Thursday.  States that he is mainly feeling decree sensation in her hands and her feet on both sides.  Denies any weakness.  States that she feels like she is going to fall because she cannot feel her feet on the ground as normal.  Patient also states she has been having some blurry vision in both of her eyes.  States that this is somewhat transient as it goes away whenever she focuses and blinks.  Denies any ocular pain.  Patient denies feelings of near syncope or room spinning dizziness.  States that her generalized weakness makes her feel as if she is going to fall and has to quickly get to somewhere where she can sit.  Denies any recent viral illness.  Denies any fevers, chills, chest pain, shortness of breath, abdominal pain, nausea, vomiting, urinary symptoms, change in bowel habits.  Past medical history significant for adrenal insufficiency on fludrocortisone, IBS, anemia, depression, B12 deficiency, GERD  Home Medications Prior to Admission medications   Medication Sig Start Date End Date Taking? Authorizing Provider  Elmarie Shiley FE 1/20 1-20 MG-MCG tablet Take 1 tablet by mouth daily. 01/15/24  Yes [provider]  tiZANidine (ZANAFLEX) 2 MG tablet Take 2 mg by mouth 3 (three) times daily as needed. 01/09/24  Yes [provider]   citalopram (CELEXA) 20 MG tablet TAKE 1 TABLET(20 MG) BY MOUTH AT BEDTIME 09/17/21   Luking, Scott A, MD  clonazePAM (KLONOPIN) 0.5 MG tablet TAKE 1/2 TO 1 (ONE-HALF TO ONE) TABLET BY MOUTH TWICE DAILY AS NEEDED FOR ANXIETY 12/18/20   Luking, Jonna Coup, MD  dicyclomine (BENTYL) 10 MG capsule Take 1 capsule (10 mg total) by mouth 2 (two) times daily. 01/02/24   Zehr, Princella Pellegrini, PA-C  linaclotide (LINZESS) 290 MCG CAPS capsule Take 1 capsule (290 mcg total) by mouth daily before breakfast. 01/15/24 04/14/24  Zehr, Shanda Bumps D, PA-C  pantoprazole (PROTONIX) 40 MG tablet Take 1 tablet (40 mg total) by mouth daily. TAKE 1 TABLET(40 MG) BY MOUTH DAILY Strength: 40 mg 05/03/22 05/03/23  Lanelle Bal, DO  Rimegepant Sulfate (NURTEC) 75 MG TBDP TAKE 1 TABLET BY MOUTH AT ONSET OF MIGRAINE AND MAY REPEAT 1 TABLET IN 24 HOUR 05/02/23   Butch Penny, NP      Allergies    Hydrocodone bit-homatrop mbr and Percocet [oxycodone-acetaminophen]    Review of Systems   Review of Systems  Physical Exam Updated Vital Signs BP 97/64   Pulse 87   Temp 98.2 F (36.8 C)   Resp 16   Ht 5\' 3"  (1.6 m)   Wt 71.2 kg   SpO2 100%   Breastfeeding No   BMI 27.81 kg/m  Physical Exam Vitals and nursing note reviewed.  Constitutional:      General: She is not in acute distress.    Appearance: She is well-developed.  HENT:     Head: Normocephalic and atraumatic.  Eyes:     Conjunctiva/sclera: Conjunctivae normal.  Cardiovascular:     Rate and Rhythm: Normal rate and regular rhythm.     Pulses: Normal pulses.     Heart sounds: No murmur heard. Pulmonary:     Effort: Pulmonary effort is normal. No respiratory distress.     Breath sounds: Normal breath sounds.  Abdominal:     Palpations: Abdomen is soft.     Tenderness: There is no abdominal tenderness.  Musculoskeletal:        General: No swelling.     Cervical back: Neck supple.  Skin:    General: Skin is warm and dry.     Capillary Refill: Capillary refill  takes less than 2 seconds.  Neurological:     Mental Status: She is alert.     Comments: Alert and oriented to self, place, time and event.   Speech is fluent, clear without dysarthria or dysphasia.   Strength 5/5 in upper/lower extremities   Sensation intact in upper/lower extremities besides decree sensation in hands and feet bilaterally.  Normal gait.  Negative Romberg. No pronator drift.  Normal finger-to-nose and feet tapping.  CN I not tested  CN II grossly intact visual fields bilaterally. Did not visualize posterior eye.  CN III, IV, VI PERRLA and EOMs intact bilaterally  CN V Intact sensation to sharp and light touch to the face.  Decree sensation along V1 distribution of cranial 5 on the left side. CN VII facial movements symmetric  CN VIII not tested  CN IX, X no uvula deviation, symmetric rise of soft palate  CN XI 5/5 SCM and trapezius strength bilaterally  CN XII Midline tongue protrusion, symmetric L/R movements     Psychiatric:        Mood and Affect: Mood normal.     ED Results / Procedures / Treatments   Labs (all labs ordered are listed, but only abnormal results are displayed) Labs Reviewed  BASIC METABOLIC PANEL - Abnormal; Notable for the following components:      Result Value   Sodium 134 (*)    Potassium 5.4 (*)    Glucose, Bld 101 (*)    All other components within normal limits  CBG MONITORING, ED - Abnormal; Notable for the following components:   Glucose-Capillary 118 (*)    All other components within normal limits  RESP PANEL BY RT-PCR (RSV, FLU A&B, COVID)  RVPGX2  CBC  PREGNANCY, URINE  TSH  SEDIMENTATION RATE  C-REACTIVE PROTEIN  CORTISOL    EKG EKG Interpretation Date/Time:  Thursday January 25 2024 13:21:19 EDT Ventricular Rate:  68 PR Interval:  129 QRS Duration:  75 QT Interval:  425 QTC Calculation: 452 R Axis:   43  Text Interpretation: Sinus rhythm Low voltage, precordial leads No acute changes No significant change  since last tracing Confirmed by Derwood Kaplan [phone]) on 01/25/2024 2:56:33 PM  Radiology MR BRAIN W WO CONTRAST Result Date: 01/25/2024 CLINICAL DATA:  Neuro deficit, acute, stroke suspected. Numbness in the legs and hands. EXAM: MRI HEAD WITHOUT AND WITH CONTRAST TECHNIQUE: Multiplanar, multiecho pulse sequences of the brain and surrounding structures were obtained without and with intravenous contrast. CONTRAST:  7mL GADAVIST GADOBUTROL 1 MMOL/ML IV SOLN COMPARISON:  Head and neck CTA 12/01/2021.  Head MRI 10/30/2019. FINDINGS: Brain: There is no evidence of an acute infarct, intracranial hemorrhage, mass, midline shift, or extra-axial fluid collection. Cerebral volume is normal. The ventricles  are normal in size. The brain is normal in signal, and no abnormal enhancement is identified. Vascular: Major intracranial vascular flow voids are preserved. Normal vascular enhancement. Skull and upper cervical spine: Unremarkable bone marrow signal. Sinuses/Orbits: Unremarkable orbits. No significant inflammatory changes in the paranasal sinuses. Clear mastoid air cells. Other: None. IMPRESSION: Negative brain MRI. Electronically Signed   By: Sebastian Ache M.D.   On: 01/25/2024 16:54   MR Cervical Spine W and Wo Contrast Result Date: 01/25/2024 CLINICAL DATA:  Neuro deficit, acute, stroke suspected. Numbness in the legs and hands. No known injury or prior relevant surgery. EXAM: MRI CERVICAL SPINE WITHOUT AND WITH CONTRAST TECHNIQUE: Multiplanar and multiecho pulse sequences of the cervical spine, to include the craniocervical junction and cervicothoracic junction, were obtained without and with intravenous contrast. CONTRAST:  7mL GADAVIST GADOBUTROL 1 MMOL/ML IV SOLN COMPARISON:  Cervical spine radiographs 06/02/2011. FINDINGS: Alignment: Physiologic. Vertebrae: No acute or suspicious osseous findings. Cord: Normal in signal and caliber. No abnormal intradural enhancement. Posterior Fossa, vertebral arteries,  paraspinal tissues: Intracranial findings dictated separately.Bilateral vertebral artery flow voids. No significant paraspinal findings. Disc levels: The cervical disc heights are maintained. There is mild disc bulging at C3-4, C4-5 and C6-7. No significant disc herniation, spinal stenosis or nerve root encroachment. The foramina are patent. IMPRESSION: 1. No acute findings or explanation for the patient's symptoms. 2. Mild disc bulging at C3-4, C4-5 and C6-7 without significant disc herniation, spinal stenosis or nerve root encroachment. 3. Normal appearance of the cervical spinal cord. 4. Intracranial findings dictated separately. Electronically Signed   By: Carey Bullocks M.D.   On: 01/25/2024 16:51    Procedures Procedures    Medications Ordered in ED Medications  sodium chloride 0.9 % bolus 1,000 mL (0 mLs Intravenous Stopped 01/25/24 1503)  ketorolac (TORADOL) 15 MG/ML injection 15 mg (15 mg Intravenous Given 01/25/24 1434)  metoCLOPramide (REGLAN) injection 10 mg (10 mg Intravenous Given 01/25/24 1435)  diphenhydrAMINE (BENADRYL) injection 25 mg (25 mg Intravenous Given 01/25/24 1435)  gadobutrol (GADAVIST) 1 MMOL/ML injection 7 mL (7 mLs Intravenous Contrast Given 01/25/24 1607)  hydrocortisone sodium succinate (SOLU-CORTEF) 100 MG injection 100 mg (100 mg Intravenous Given 01/25/24 1825)    ED Course/ Medical Decision Making/ A&P Clinical Course as of 01/25/24 2109  Thu Jan 25, 2024  1805 Consulted attending Dr. Renaye Rakers given patient's history of adrenal insufficiency with slight hyponatremia, hyperkalemia in the setting of patient's symptoms with reassuring MR imaging who recommended 1 dose of Solu-Cortef and discharged with outpatient follow-up. [CR]    Clinical Course User Index [CR] Peter Garter, PA                                 Medical Decision Making Amount and/or Complexity of Data Reviewed Labs: ordered. Radiology: ordered.  Risk Prescription drug  management.   This patient presents to the ED for concern of weakness, this involves an extensive number of treatment options, and is a complaint that carries with it a high risk of complications and morbidity.  The differential diagnosis includes MS, transverse myelitis, CVA, migraine, viral illness, other,   Co morbidities that complicate the patient evaluation  See HPI   Additional history obtained:  Additional history obtained from EMR External records from outside source obtained and reviewed including hospital records   Lab Tests:  I Ordered, and personally interpreted labs.  The pertinent results include: No leukocytosis.  No evidence  of anemia.  Platelets within range.  Hyponatremia of 134, hyperkalemia 5.4.  No renal dysfunction.  Viral testing negative.  TSH, sed rate, urine pregnancy negative.   Imaging Studies ordered:  I ordered imaging studies including MRI/cervical spine I independently visualized and interpreted imaging which showed mild disc bulging but otherwise, no acute abnormality. I agree with the radiologist interpretation   Cardiac Monitoring: / EKG:  The patient was maintained on a cardiac monitor.  I personally viewed and interpreted the cardiac monitored which showed an underlying rhythm of: Sinus rhythm   Consultations Obtained:  I requested consultation with attending Dr. Rhunette Croft who independently evaluated the patient was agreement with treatment plan going forward.  Problem List / ED Course / Critical interventions / Medication management  Generalized weakness, tingling hands and feet I ordered medication including Toradol, Reglan, Benadryl, normal saline, Solu-Cortef   Reevaluation of the patient after these medicines showed that the patient improved I have reviewed the patients home medicines and have made adjustments as needed   Social Determinants of Health:  Denies tobacco, licit drug use.   Test / Admission -  Considered:  Generalized weakness, tingling hands and feet Vitals signs within normal range and stable throughout visit. Laboratory/imaging studies significant for: See above 38 year old female presents emergency department with a few different complaints.  Patient reports generalized weakness, decree sensation in hands and feet.  Patient states that she has been dealing symptoms similar intermittent for the past couple of years.  Does follow with neurosurgery, cardiology in the outpatient setting.  Regarding decrease sensation, states that has been present since Thursday.  States that he is mainly feeling decree sensation in her hands and her feet on both sides.  Denies any weakness.  States that she feels like she is going to fall because she cannot feel her feet on the ground as normal.  Patient also states she has been having some blurry vision in both of her eyes.  States that this is somewhat transient as it goes away whenever she focuses and blinks.  Denies any ocular pain.  Patient denies feelings of near syncope or room spinning dizziness.  States that her generalized weakness makes her feel as if she is going to fall and has to quickly get to somewhere where she can sit.  Denies any recent viral illness.  Denies any fevers, chills, chest pain, shortness of breath, abdominal pain, nausea, vomiting, urinary symptoms, change in bowel habits. On exam, subjective decrease sensation hands and feet as above.  Otherwise, nonfocal neuroexam.  Patient had been able to without assistance.  Workup today overall reassuring.  Laboratory studies concerning for slight hyponatremia, hyper kalemia as above.  Viral testing negative.  MRI imaging of patient cervical spine as well as brain negative for any acute abnormality; low suspicion for transverse myelitis, and mass.  Patient is with history of adrenal insufficiency with very mild hyponatremia, hyperkalemia; pending random cortisol level.  Suspect there could be a  component to very mild adrenal insufficiency; no evidence of adrenal crisis.  Trialed with 1 dose of Solu-Cortef on the ED.  Will recommend follow-up with PCP/endocrinology in the outpatient setting for further evaluation/assessment regarding symptoms.  Treatment plan discussed at length with the patient and she acknowledged understanding was agreeable to said plan.  Patient overall well-appearing, afebrile in no acute distress. Worrisome signs and symptoms were discussed with the patient, and the patient acknowledged understanding to return to the ED if noticed. Patient was stable upon discharge.  Final Clinical Impression(s) / ED Diagnoses Final diagnoses:  Generalized weakness  Numbness and tingling in both hands  Tingling of both feet    Rx / DC Orders ED Discharge Orders     None         Peter Garter, [place] 01/25/24 2109    Derwood Kaplan, MD 01/29/24 1630

## 2024-01-25 NOTE — Discharge Instructions (Addendum)
 As discussed, imaging of your brain and cervical spine appeared normal.  Your cortisol levels are pending at this time.  There could be a minor adrenal insufficiency.  Follow MyChart for results of this testing and follow-up with neurology.  If you feel any worse or develop any new or worsening symptoms, please return to either [facility] or [facility] emergency department for further assessment.

## 2024-01-25 NOTE — ED Triage Notes (Addendum)
 Pt presents c/o intermittent episodes of weakness/near collapse since Tuesday night around 1830. Endorses shakiness, 'feeling heavy', some numbness in feet bilaterally, blurred vision from baseline, neck pressure bilaterally. Relief with lying down. Similar hx x ~1 year ago, hx migraines. Denies LOC.  CBG 118 in triage.

## 2024-01-26 ENCOUNTER — Encounter: Payer: Self-pay | Admitting: Cardiology

## 2024-01-29 ENCOUNTER — Telehealth: Payer: Self-pay | Admitting: Adult Health

## 2024-01-29 NOTE — Telephone Encounter (Signed)
  I called the patient.  Was doing chart review and saw that she is on estrogen/progesterone birth control tablets.  She confirmed that she does have auras with her migraines.  I reviewed potential stroke risk as discussed below.  She verbalized understanding   Discussed:  There is increased risk for stroke in women with migraine with aura and a contraindication for the combined contraceptive pill for use by women who have migraine with aura. The risk for women with migraine without aura is lower. However other risk factors like smoking are far more likely to increase stroke risk than migraine. There is a recommendation for no smoking and for the use of OCPs without estrogen such as progestogen only pills particularly for women with migraine with aura.Marland Kitchen People who have migraine headaches with auras may be 3 times more likely to have a stroke caused by a blood clot, compared to migraine patients who don't see auras. Women who take hormone-replacement therapy may be 30 percent more likely to suffer a clot-based stroke than women not taking medication containing estrogen. Other risk factors like smoking and high blood pressure may be  much more important. And stroke is still a rare complication due to migraine aura and is controversial and lower doses may not cause a risk.

## 2024-02-04 ENCOUNTER — Other Ambulatory Visit: Payer: Self-pay | Admitting: Gastroenterology

## 2024-02-06 ENCOUNTER — Other Ambulatory Visit: Payer: Self-pay

## 2024-02-06 MED ORDER — FLUDROCORTISONE ACETATE 0.1 MG PO TABS: 100.0000 ug | ORAL_TABLET | Freq: Every day | ORAL | 3 refills | Status: AC

## 2024-02-06 MED ORDER — TRULANCE 3 MG PO TABS: 3.0000 mg | ORAL_TABLET | Freq: Every day | ORAL | 1 refills | Status: AC

## 2024-02-06 NOTE — Addendum Note (Signed)
 Addended by: Loretha Stapler on: 02/06/2024 04:36 PM   Modules accepted: Orders

## 2024-02-07 ENCOUNTER — Telehealth: Payer: Self-pay

## 2024-02-07 ENCOUNTER — Other Ambulatory Visit (HOSPITAL_COMMUNITY): Payer: Self-pay

## 2024-02-07 NOTE — Telephone Encounter (Signed)
 Pharmacy Patient Advocate Encounter  Received notification from [facility] that Prior Authorization for Trulance 3MG  tablets has been DENIED.  Full denial letter will be uploaded to the media tab. See denial reason below.  Your plan only covers this drug when it is used for certain health conditions. Covered uses are for A) Chronic idiopathic constipation (CIC) in an adult and B) Irritable bowel syndrome with constipation (IBS-C) in an adult.   PA #/Case ID/Reference #: [id number]

## 2024-02-07 NOTE — Telephone Encounter (Signed)
 Pharmacy Patient Advocate Encounter   Received notification from CoverMyMeds that prior authorization for Trulance 3MG  tablets is required/requested.   Insurance verification completed.   The patient is insured through [facility] .   Per test claim: PA required; PA submitted to above mentioned insurance via CoverMyMeds Key/confirmation #/[id number] Status is pending

## 2024-04-26 ENCOUNTER — Other Ambulatory Visit: Payer: Self-pay | Admitting: Gastroenterology

## 2024-04-29 ENCOUNTER — Other Ambulatory Visit: Payer: Self-pay | Admitting: Gastroenterology

## 2024-05-08 ENCOUNTER — Other Ambulatory Visit: Payer: Self-pay | Admitting: Adult Health

## 2024-05-16 ENCOUNTER — Ambulatory Visit: Admitting: Cardiology

## 2024-06-18 ENCOUNTER — Ambulatory Visit: Admitting: Nurse Practitioner

## 2024-08-07 ENCOUNTER — Encounter

## 2024-08-09 ENCOUNTER — Telehealth: Payer: Self-pay

## 2024-08-09 NOTE — Telephone Encounter (Signed)
 Pharmacy Patient Advocate Encounter   Received notification from CoverMyMeds that prior authorization for Nurtec 75mg  Tablet is due for renewal.   Insurance verification completed.   The patient is insured through [facility].  Action: Patient hasn't been seen in your office in over a year. Plan requires updated chart notes for PA renewal.

## 2024-09-03 MED ORDER — LINACLOTIDE 290 MCG PO CAPS: 290.0000 ug | ORAL_CAPSULE | Freq: Every day | ORAL | 1 refills | Status: AC

## 2024-10-02 ENCOUNTER — Other Ambulatory Visit (HOSPITAL_COMMUNITY): Payer: Self-pay | Admitting: Family Medicine

## 2024-10-02 DIAGNOSIS — R1011 Right upper quadrant pain: Secondary | ICD-10-CM

## 2024-10-05 ENCOUNTER — Ambulatory Visit (HOSPITAL_BASED_OUTPATIENT_CLINIC_OR_DEPARTMENT_OTHER)
Admission: RE | Admit: 2024-10-05 | Discharge: 2024-10-05 | Disposition: A | Discharge status: Home / Self Care | Source: Ambulatory Visit | Attending: Family Medicine | Admitting: Family Medicine

## 2024-10-05 DIAGNOSIS — R1011 Right upper quadrant pain: Secondary | ICD-10-CM | POA: Diagnosis present

## 2024-12-04 ENCOUNTER — Encounter
